# Patient Record
Sex: Female | Born: 1955 | Race: White | Hispanic: No | Marital: Married | Smoking: Current some day smoker
Health system: Southern US, Community
[De-identification: ages and names within clinical notes are randomized; demographics above are authoritative.]

## PROBLEM LIST (undated history)

## (undated) DIAGNOSIS — J309 Allergic rhinitis, unspecified: Secondary | ICD-10-CM

## (undated) DIAGNOSIS — K589 Irritable bowel syndrome without diarrhea: Secondary | ICD-10-CM

## (undated) DIAGNOSIS — R570 Cardiogenic shock: Secondary | ICD-10-CM

## (undated) DIAGNOSIS — D179 Benign lipomatous neoplasm, unspecified: Secondary | ICD-10-CM

## (undated) DIAGNOSIS — Z720 Tobacco use: Secondary | ICD-10-CM

## (undated) DIAGNOSIS — H32 Chorioretinal disorders in diseases classified elsewhere: Secondary | ICD-10-CM

## (undated) DIAGNOSIS — F419 Anxiety disorder, unspecified: Secondary | ICD-10-CM

## (undated) DIAGNOSIS — I4510 Unspecified right bundle-branch block: Secondary | ICD-10-CM

## (undated) DIAGNOSIS — F909 Attention-deficit hyperactivity disorder, unspecified type: Secondary | ICD-10-CM

## (undated) DIAGNOSIS — I38 Endocarditis, valve unspecified: Secondary | ICD-10-CM

## (undated) DIAGNOSIS — I2510 Atherosclerotic heart disease of native coronary artery without angina pectoris: Secondary | ICD-10-CM

## (undated) DIAGNOSIS — M797 Fibromyalgia: Secondary | ICD-10-CM

## (undated) DIAGNOSIS — K602 Anal fissure, unspecified: Secondary | ICD-10-CM

## (undated) DIAGNOSIS — I10 Essential (primary) hypertension: Secondary | ICD-10-CM

## (undated) DIAGNOSIS — N23 Unspecified renal colic: Secondary | ICD-10-CM

## (undated) DIAGNOSIS — M1990 Unspecified osteoarthritis, unspecified site: Secondary | ICD-10-CM

## (undated) DIAGNOSIS — G540 Brachial plexus disorders: Secondary | ICD-10-CM

## (undated) DIAGNOSIS — E876 Hypokalemia: Secondary | ICD-10-CM

## (undated) DIAGNOSIS — I4729 Other ventricular tachycardia: Secondary | ICD-10-CM

## (undated) DIAGNOSIS — K219 Gastro-esophageal reflux disease without esophagitis: Secondary | ICD-10-CM

## (undated) DIAGNOSIS — I472 Ventricular tachycardia: Secondary | ICD-10-CM

## (undated) DIAGNOSIS — I5032 Chronic diastolic (congestive) heart failure: Secondary | ICD-10-CM

## (undated) DIAGNOSIS — C4491 Basal cell carcinoma of skin, unspecified: Secondary | ICD-10-CM

## (undated) DIAGNOSIS — N952 Postmenopausal atrophic vaginitis: Secondary | ICD-10-CM

## (undated) DIAGNOSIS — B399 Histoplasmosis, unspecified: Secondary | ICD-10-CM

## (undated) DIAGNOSIS — D649 Anemia, unspecified: Secondary | ICD-10-CM

## (undated) DIAGNOSIS — D72829 Elevated white blood cell count, unspecified: Secondary | ICD-10-CM

## (undated) DIAGNOSIS — N179 Acute kidney failure, unspecified: Secondary | ICD-10-CM

## (undated) DIAGNOSIS — E785 Hyperlipidemia, unspecified: Secondary | ICD-10-CM

## (undated) DIAGNOSIS — N951 Menopausal and female climacteric states: Secondary | ICD-10-CM

## (undated) HISTORY — DX: Postmenopausal atrophic vaginitis: N95.2

## (undated) HISTORY — DX: Unspecified renal colic: N23

## (undated) HISTORY — DX: Attention-deficit hyperactivity disorder, unspecified type: F90.9

## (undated) HISTORY — DX: Histoplasmosis, unspecified: B39.9

## (undated) HISTORY — DX: Unspecified right bundle-branch block: I45.10

## (undated) HISTORY — PX: CORONARY STENT PLACEMENT: SHX1402

## (undated) HISTORY — PX: TONSILLECTOMY: SUR1361

## (undated) HISTORY — DX: Chronic diastolic (congestive) heart failure: I50.32

## (undated) HISTORY — DX: Chorioretinal disorders in diseases classified elsewhere: H32

## (undated) HISTORY — DX: Gastro-esophageal reflux disease without esophagitis: K21.9

## (undated) HISTORY — PX: EYE SURGERY: SHX253

## (undated) HISTORY — DX: Elevated white blood cell count, unspecified: D72.829

## (undated) HISTORY — DX: Acute kidney failure, unspecified: N17.9

## (undated) HISTORY — DX: Menopausal and female climacteric states: N95.1

---
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
## 1978-06-17 HISTORY — PX: TONSILLECTOMY: SHX5217

## 1980-06-17 HISTORY — PX: DILATION AND CURETTAGE OF UTERUS: SHX78

## 2006-02-21 ENCOUNTER — Ambulatory Visit: Payer: Self-pay | Admitting: Internal Medicine

## 2006-04-09 ENCOUNTER — Ambulatory Visit: Payer: Self-pay | Admitting: Internal Medicine

## 2006-05-26 ENCOUNTER — Ambulatory Visit: Payer: Self-pay | Admitting: Internal Medicine

## 2006-05-26 LAB — CONVERTED CEMR LAB
Albumin: 3.8 g/dL (ref 3.5–5.2)
Alkaline Phosphatase: 86 units/L (ref 39–117)
BUN: 14 mg/dL (ref 6–23)
Basophils Absolute: 0.1 10*3/uL (ref 0.0–0.1)
CO2: 28 meq/L (ref 19–32)
Chol/HDL Ratio, serum: 5.3
Creatinine, Ser: 1 mg/dL (ref 0.4–1.2)
Glomerular Filtration Rate, Af Am: 75 mL/min/{1.73_m2}
Glucose, Bld: 100 mg/dL — ABNORMAL HIGH (ref 70–99)
HDL: 38.4 mg/dL — ABNORMAL LOW (ref >39.0)
Hemoglobin: 15 g/dL (ref 12.0–15.0)
MCHC: 34.5 g/dL (ref 30.0–36.0)
Monocytes Relative: 7.1 % (ref 3.0–11.0)
Neutro Abs: 7 10*3/uL (ref 1.4–7.7)
Neutrophils Relative %: 66.8 % (ref 43.0–77.0)
Platelets: 309 10*3/uL (ref 150–400)
RDW: 12.2 % (ref 11.5–14.6)
TSH: 1.6 microintl units/mL (ref 0.35–5.50)
Total Bilirubin: 0.5 mg/dL (ref 0.3–1.2)
Total Protein: 6.8 g/dL (ref 6.0–8.3)
Triglyceride fasting, serum: 466 mg/dL (ref 0–149)
VLDL: 93 mg/dL — ABNORMAL HIGH (ref 0–40)

## 2006-06-05 ENCOUNTER — Ambulatory Visit: Payer: Self-pay | Admitting: Internal Medicine

## 2006-07-14 ENCOUNTER — Other Ambulatory Visit: Admission: RE | Admit: 2006-07-14 | Discharge: 2006-07-14 | Payer: Self-pay | Admitting: Internal Medicine

## 2006-07-14 ENCOUNTER — Encounter (INDEPENDENT_AMBULATORY_CARE_PROVIDER_SITE_OTHER): Payer: Self-pay | Admitting: *Deleted

## 2006-07-14 ENCOUNTER — Ambulatory Visit: Payer: Self-pay | Admitting: Internal Medicine

## 2006-07-23 ENCOUNTER — Ambulatory Visit: Payer: Self-pay | Admitting: Internal Medicine

## 2006-08-08 ENCOUNTER — Ambulatory Visit: Payer: Self-pay | Admitting: Internal Medicine

## 2006-09-19 ENCOUNTER — Ambulatory Visit: Payer: Self-pay | Admitting: Internal Medicine

## 2006-10-14 ENCOUNTER — Ambulatory Visit: Payer: Self-pay | Admitting: Internal Medicine

## 2006-10-27 ENCOUNTER — Ambulatory Visit: Payer: Self-pay | Admitting: Internal Medicine

## 2006-12-15 DIAGNOSIS — N951 Menopausal and female climacteric states: Secondary | ICD-10-CM

## 2006-12-15 DIAGNOSIS — K219 Gastro-esophageal reflux disease without esophagitis: Secondary | ICD-10-CM

## 2006-12-15 DIAGNOSIS — J309 Allergic rhinitis, unspecified: Secondary | ICD-10-CM | POA: Insufficient documentation

## 2006-12-15 DIAGNOSIS — I10 Essential (primary) hypertension: Secondary | ICD-10-CM

## 2006-12-15 DIAGNOSIS — IMO0001 Reserved for inherently not codable concepts without codable children: Secondary | ICD-10-CM | POA: Insufficient documentation

## 2006-12-15 DIAGNOSIS — E785 Hyperlipidemia, unspecified: Secondary | ICD-10-CM

## 2006-12-15 HISTORY — DX: Menopausal and female climacteric states: N95.1

## 2007-01-05 ENCOUNTER — Ambulatory Visit: Payer: Self-pay | Admitting: Internal Medicine

## 2007-01-05 DIAGNOSIS — N952 Postmenopausal atrophic vaginitis: Secondary | ICD-10-CM

## 2007-01-05 HISTORY — DX: Postmenopausal atrophic vaginitis: N95.2

## 2007-01-14 ENCOUNTER — Telehealth (INDEPENDENT_AMBULATORY_CARE_PROVIDER_SITE_OTHER): Payer: Self-pay

## 2007-01-21 ENCOUNTER — Ambulatory Visit: Payer: Self-pay | Admitting: Internal Medicine

## 2007-01-21 DIAGNOSIS — R300 Dysuria: Secondary | ICD-10-CM

## 2007-01-21 DIAGNOSIS — N3000 Acute cystitis without hematuria: Secondary | ICD-10-CM

## 2007-01-21 LAB — CONVERTED CEMR LAB
Nitrite: NEGATIVE
Protein, U semiquant: 30

## 2007-02-10 ENCOUNTER — Ambulatory Visit: Payer: Self-pay | Admitting: Family Medicine

## 2007-02-10 DIAGNOSIS — S93409A Sprain of unspecified ligament of unspecified ankle, initial encounter: Secondary | ICD-10-CM | POA: Insufficient documentation

## 2007-02-10 DIAGNOSIS — F909 Attention-deficit hyperactivity disorder, unspecified type: Secondary | ICD-10-CM

## 2007-03-02 ENCOUNTER — Ambulatory Visit: Payer: Self-pay | Admitting: Internal Medicine

## 2007-04-03 ENCOUNTER — Ambulatory Visit: Payer: Self-pay | Admitting: Internal Medicine

## 2007-04-28 ENCOUNTER — Ambulatory Visit: Payer: Self-pay | Admitting: Internal Medicine

## 2007-04-28 DIAGNOSIS — J329 Chronic sinusitis, unspecified: Secondary | ICD-10-CM | POA: Insufficient documentation

## 2007-05-06 ENCOUNTER — Telehealth: Payer: Self-pay | Admitting: Internal Medicine

## 2007-05-18 ENCOUNTER — Telehealth: Payer: Self-pay | Admitting: Internal Medicine

## 2007-05-20 ENCOUNTER — Telehealth: Payer: Self-pay | Admitting: Internal Medicine

## 2007-06-26 ENCOUNTER — Ambulatory Visit: Payer: Self-pay | Admitting: Internal Medicine

## 2007-09-07 ENCOUNTER — Telehealth: Payer: Self-pay | Admitting: Internal Medicine

## 2007-09-17 ENCOUNTER — Telehealth: Payer: Self-pay | Admitting: Internal Medicine

## 2007-09-24 ENCOUNTER — Telehealth: Payer: Self-pay | Admitting: Internal Medicine

## 2007-10-23 ENCOUNTER — Telehealth: Payer: Self-pay | Admitting: Internal Medicine

## 2007-10-28 ENCOUNTER — Ambulatory Visit: Payer: Self-pay | Admitting: Internal Medicine

## 2008-01-21 ENCOUNTER — Telehealth: Payer: Self-pay | Admitting: Internal Medicine

## 2008-02-04 ENCOUNTER — Ambulatory Visit: Payer: Self-pay | Admitting: Internal Medicine

## 2008-02-04 LAB — CONVERTED CEMR LAB
AST: 15 units/L (ref 0–37)
Albumin: 4.1 g/dL (ref 3.5–5.2)
Alkaline Phosphatase: 88 units/L (ref 39–117)
BUN: 15 mg/dL (ref 6–23)
Blood in Urine, dipstick: NEGATIVE
CO2: 27 meq/L (ref 19–32)
Chloride: 103 meq/L (ref 96–112)
Eosinophils Absolute: 0.4 10*3/uL (ref 0.0–0.7)
Eosinophils Relative: 2.4 % (ref 0.0–5.0)
GFR calc non Af Amer: 61 mL/min
HDL: 32 mg/dL — ABNORMAL LOW (ref >39.0)
Ketones, urine, test strip: NEGATIVE
Lymphocytes Relative: 17.4 % (ref 12.0–46.0)
MCV: 94.6 fL (ref 78.0–100.0)
Monocytes Relative: 5.4 % (ref 3.0–12.0)
Neutrophils Relative %: 73.8 % (ref 43.0–77.0)
Nitrite: NEGATIVE
Platelets: 274 10*3/uL (ref 150–400)
Potassium: 3.8 meq/L (ref 3.5–5.1)
Protein, U semiquant: NEGATIVE
Total CHOL/HDL Ratio: 5.2
Triglycerides: 354 mg/dL (ref 0–149)
Urobilinogen, UA: 0.2
VLDL: 71 mg/dL — ABNORMAL HIGH (ref 0–40)
WBC Urine, dipstick: NEGATIVE
WBC: 15.3 10*3/uL — ABNORMAL HIGH (ref 4.5–10.5)

## 2008-02-11 ENCOUNTER — Encounter: Payer: Self-pay | Admitting: Internal Medicine

## 2008-02-11 ENCOUNTER — Ambulatory Visit: Payer: Self-pay | Admitting: Internal Medicine

## 2008-02-11 ENCOUNTER — Other Ambulatory Visit: Admission: RE | Admit: 2008-02-11 | Discharge: 2008-02-11 | Payer: Self-pay | Admitting: Internal Medicine

## 2008-02-11 DIAGNOSIS — M1990 Unspecified osteoarthritis, unspecified site: Secondary | ICD-10-CM

## 2008-05-10 ENCOUNTER — Telehealth: Payer: Self-pay | Admitting: Internal Medicine

## 2008-05-24 ENCOUNTER — Telehealth: Payer: Self-pay | Admitting: Internal Medicine

## 2008-05-26 ENCOUNTER — Ambulatory Visit: Payer: Self-pay | Admitting: Internal Medicine

## 2008-07-05 ENCOUNTER — Ambulatory Visit: Payer: Self-pay | Admitting: Internal Medicine

## 2008-07-05 DIAGNOSIS — N23 Unspecified renal colic: Secondary | ICD-10-CM

## 2008-07-05 HISTORY — DX: Unspecified renal colic: N23

## 2008-07-05 LAB — CONVERTED CEMR LAB
Bilirubin Urine: NEGATIVE
Ketones, urine, test strip: NEGATIVE
Protein, U semiquant: 100
Urobilinogen, UA: NEGATIVE

## 2008-07-11 ENCOUNTER — Telehealth: Payer: Self-pay | Admitting: Internal Medicine

## 2008-09-19 ENCOUNTER — Telehealth (INDEPENDENT_AMBULATORY_CARE_PROVIDER_SITE_OTHER): Payer: Self-pay

## 2008-12-16 ENCOUNTER — Ambulatory Visit: Payer: Self-pay | Admitting: Internal Medicine

## 2009-01-03 ENCOUNTER — Encounter: Payer: Self-pay | Admitting: Cardiology

## 2009-03-07 ENCOUNTER — Telehealth: Payer: Self-pay | Admitting: Internal Medicine

## 2009-03-17 ENCOUNTER — Ambulatory Visit: Payer: Self-pay | Admitting: Internal Medicine

## 2009-06-05 ENCOUNTER — Ambulatory Visit: Payer: Self-pay | Admitting: Internal Medicine

## 2009-07-18 ENCOUNTER — Ambulatory Visit: Payer: Self-pay | Admitting: Internal Medicine

## 2009-07-18 DIAGNOSIS — Z85828 Personal history of other malignant neoplasm of skin: Secondary | ICD-10-CM

## 2009-09-04 ENCOUNTER — Telehealth: Payer: Self-pay | Admitting: Internal Medicine

## 2009-10-02 ENCOUNTER — Telehealth: Payer: Self-pay | Admitting: Internal Medicine

## 2009-10-23 ENCOUNTER — Ambulatory Visit: Payer: Self-pay | Admitting: Internal Medicine

## 2009-10-23 LAB — CONVERTED CEMR LAB
ALT: 18 units/L (ref 0–35)
BUN: 21 mg/dL (ref 6–23)
Basophils Absolute: 0.1 10*3/uL (ref 0.0–0.1)
Bilirubin Urine: NEGATIVE
Bilirubin, Direct: 0.1 mg/dL (ref 0.0–0.3)
Cholesterol: 256 mg/dL — ABNORMAL HIGH (ref 0–200)
Creatinine, Ser: 1.1 mg/dL (ref 0.4–1.2)
Direct LDL: 159 mg/dL
Eosinophils Relative: 2.8 % (ref 0.0–5.0)
GFR calc non Af Amer: 52.38 mL/min (ref >60)
Glucose, Urine, Semiquant: NEGATIVE
HDL: 47 mg/dL (ref >39.00)
MCV: 94.2 fL (ref 78.0–100.0)
Monocytes Absolute: 0.8 10*3/uL (ref 0.1–1.0)
Monocytes Relative: 5.9 % (ref 3.0–12.0)
Neutrophils Relative %: 74.5 % (ref 43.0–77.0)
Platelets: 311 10*3/uL (ref 150.0–400.0)
Total Bilirubin: 0.4 mg/dL (ref 0.3–1.2)
VLDL: 79 mg/dL — ABNORMAL HIGH (ref 0.0–40.0)
WBC: 13.5 10*3/uL — ABNORMAL HIGH (ref 4.5–10.5)
pH: 5.5

## 2009-10-31 ENCOUNTER — Other Ambulatory Visit: Admission: RE | Admit: 2009-10-31 | Discharge: 2009-10-31 | Payer: Self-pay | Admitting: Internal Medicine

## 2009-10-31 ENCOUNTER — Ambulatory Visit: Payer: Self-pay | Admitting: Internal Medicine

## 2009-11-10 ENCOUNTER — Ambulatory Visit: Payer: Self-pay | Admitting: Internal Medicine

## 2009-11-10 DIAGNOSIS — J069 Acute upper respiratory infection, unspecified: Secondary | ICD-10-CM | POA: Insufficient documentation

## 2009-11-15 ENCOUNTER — Telehealth: Payer: Self-pay | Admitting: Internal Medicine

## 2009-11-16 ENCOUNTER — Telehealth: Payer: Self-pay | Admitting: Internal Medicine

## 2009-12-29 ENCOUNTER — Encounter (INDEPENDENT_AMBULATORY_CARE_PROVIDER_SITE_OTHER): Payer: Self-pay | Admitting: *Deleted

## 2010-01-04 ENCOUNTER — Telehealth: Payer: Self-pay | Admitting: Internal Medicine

## 2010-01-08 ENCOUNTER — Telehealth: Payer: Self-pay | Admitting: Internal Medicine

## 2010-04-20 ENCOUNTER — Ambulatory Visit: Payer: Self-pay | Admitting: Internal Medicine

## 2010-04-20 LAB — CONVERTED CEMR LAB
AST: 18 units/L (ref 0–37)
Cholesterol: 242 mg/dL — ABNORMAL HIGH (ref 0–200)
TSH: 2.18 microintl units/mL (ref 0.35–5.50)

## 2010-05-08 ENCOUNTER — Encounter: Admission: RE | Admit: 2010-05-08 | Discharge: 2010-05-08 | Payer: Self-pay | Admitting: Internal Medicine

## 2010-07-10 ENCOUNTER — Telehealth: Payer: Self-pay

## 2010-07-12 ENCOUNTER — Ambulatory Visit
Admission: RE | Admit: 2010-07-12 | Discharge: 2010-07-12 | Payer: Self-pay | Source: Home / Self Care | Attending: Internal Medicine | Admitting: Internal Medicine

## 2010-07-17 NOTE — Progress Notes (Signed)
Summary: refill lortab deined - has written rx  Phone Note Call from Patient Call back at Home Phone [phone]   [name]: Patient--live call Summary of Call: [facility]. Patient stated that it is not at her pharmacy. Initial call taken by: Warnell Forester,  January 08, 2010 8:53 AM  Follow-up for Phone Call        Pt leaving for [place] today and needs to be sure that this refill be called in today.      Follow-up by: Lucy Antigua,  January 08, 2010 9:01 AM  Additional Follow-up for Phone Call Additional follow up Details #1::        should have RF until 01-31-10 Additional Follow-up by: Gordy Savers  MD,  January 08, 2010 12:54 PM    Additional Follow-up for Phone Call Additional follow up Details #2::    was given printed rx on 10/31/09 along with all other meds - cpx visit. Should have refill until 01/31/10 - denied.  Follow-up by: Duard Brady LPN,  January 08, 2010 1:31 PM   Appended Document: refill lortab deined - has written rx attempt to call - ans mach - LMTCB if questions - denied due to printed rx given at cpx 5/17    [facility]

## 2010-07-17 NOTE — Assessment & Plan Note (Signed)
Summary: suspicious mole/gotten larger and rough/cjr   Vital Signs:  Patient profile:   55 year old female Weight:      177 pounds Temp:     97.8 degrees F oral BP sitting:   150 / 100  (left arm) Cuff size:   regular  Vitals Entered By: Raechel Ache, RN (July 18, 2009 4:26 PM) CC: c/o raised mole on left upper chest   CC:  c/o raised mole on left upper chest.  History of Present Illness: 15 -year-old patient who is  seen today for follow-up.  Her main complaint is a change in a mole type lesion involving her left upper chest.  She has had considerable sun exposure over the years and has had 2 BCEs  removed from her facial area. She has been on Crestor for dyslipidemia.  She has treated hypertension.  Allergies: No Known Drug Allergies  Past History:  Past Medical History: Reviewed history from 02/11/2008 and no changes required. Hyperlipidemia Hypertension Allergic rhinitis-seasonal GERD hx of renal stones hx of Thoracic outlet syndrome histoplasmosis of the retina ADHD tobacco use fibromyalgia right shoulder pain Osteoarthritis post tobacco use  Physical Exam  General:  Well-developed,well-nourished,in no acute distress; alert,appropriate and cooperative throughout examination; blood pressure 150/90 left arm 140/92.  Right arm Skin:  5 to 6-mm raised nodule present, involving the left upper anterior chest; margins were well circumscribed.  There are scattered areas of increased pigmentation unclear whether this represented excoriations from local trauma or true spotty pigmentation.   Impression & Recommendations:  Problem # 1:  ADHD (ICD-314.01) new prescriptions were dispensed for  genetics  Problem # 2:  HYPERTENSION (ICD-401.9)  Her updated medication list for this problem includes:    Maxzide 75-50 Mg Tabs (Triamterene-hctz) .Marland Kitchen... Take 1 tablet by mouth once a day  Problem # 3:  BASAL CELL CARCINOMA, HX OF (ICD-V10.83)  her lesion involving her  left upper anterior chest may be an irritated seborrheic dermatosis versus BCE.  Will set up for dermatology referral  Orders: Dermatology Referral (Derma)  Complete Medication List: 1)  Bayer Aspirin 325 Mg Tabs (Aspirin) .... Take 1 tablet by mouth once a day 2)  Crestor 10 Mg Tabs (Rosuvastatin calcium) .... Take 1 tablet by mouth once a day 3)  Lortab 10 10-500 Mg Tabs (Hydrocodone-acetaminophen) .Marland Kitchen.. 1 tablet four times a day 4)  Maxzide 75-50 Mg Tabs (Triamterene-hctz) .... Take 1 tablet by mouth once a day 5)  Adderall 10 Mg Tabs (Amphetamine-dextroamphetamine) .... One in the pm prn 6)  Premarin 0.625 Mg/gm Crea (Estrogens, conjugated) 7)  Carisoprodol 350 Mg Tabs (Carisoprodol) .... One at bedtime as needed for pain 8)  Amphetamine-dextroamphetamine 20 Mg Tabs (Amphetamine-dextroamphetamine) .... One every am  Patient Instructions: 1)  Please schedule a follow-up appointment in 3 months. 2)  Limit your Sodium (Salt). 3)  Dermatology followup Prescriptions: AMPHETAMINE-DEXTROAMPHETAMINE 20 MG TABS (AMPHETAMINE-DEXTROAMPHETAMINE) one every am  #30 x 0   Entered and Authorized by:   Gordy Savers  MD   Signed by:   Gordy Savers  MD on 07/18/2009   Method used:   Print then Give to Patient   RxID:   [id number] ADDERALL 10 MG  TABS (AMPHETAMINE-DEXTROAMPHETAMINE) one in the pm prn  #30 x 0   Entered and Authorized by:   Gordy Savers  MD   Signed by:   Gordy Savers  MD on 07/18/2009   Method used:   Print then Give to  Patient   RxID:   [id number] AMPHETAMINE-DEXTROAMPHETAMINE 20 MG TABS (AMPHETAMINE-DEXTROAMPHETAMINE) one every am  #30 x 0   Entered and Authorized by:   Gordy Savers  MD   Signed by:   Gordy Savers  MD on 07/18/2009   Method used:   Print then Give to Patient   RxID:   [id number] ADDERALL 10 MG  TABS (AMPHETAMINE-DEXTROAMPHETAMINE) one in the pm prn  #30 x 0   Entered and Authorized by:   Gordy Savers  MD   Signed by:   Gordy Savers  MD on 07/18/2009   Method used:   Print then Give to Patient   RxID:   [id number] AMPHETAMINE-DEXTROAMPHETAMINE 20 MG TABS (AMPHETAMINE-DEXTROAMPHETAMINE) one every am  #30 x 0   Entered and Authorized by:   Gordy Savers  MD   Signed by:   Gordy Savers  MD on 07/18/2009   Method used:   Print then Give to Patient   RxID:   [id number] ADDERALL 10 MG  TABS (AMPHETAMINE-DEXTROAMPHETAMINE) one in the pm prn  #30 x 0   Entered and Authorized by:   Gordy Savers  MD   Signed by:   Gordy Savers  MD on 07/18/2009   Method used:   Print then Give to Patient   RxID:   [id number] LORTAB 10 10-500 MG TABS (HYDROCODONE-ACETAMINOPHEN) 1 tablet four times a day  #120 x 3   Entered and Authorized by:   Gordy Savers  MD   Signed by:   Gordy Savers  MD on 07/18/2009   Method used:   Print then Give to Patient   RxID:   [id number]

## 2010-07-17 NOTE — Progress Notes (Signed)
Summary: Pts req refill of generic Adderall 20mg  and 10mg . NO XR  Phone Note Call from Patient Call back at [facility] Phone [phone]   [name]: Patient Summary of Call: Pt req script for Generic Only Adderall 20mg  and adderall 10mg .     Insurance will not cover XR.   Write for generic Only.   3 month supply for 20mg  and 10mg  Initial call taken by: Lucy Antigua,  September 04, 2009 2:28 PM    Prescriptions: AMPHETAMINE-DEXTROAMPHETAMINE 20 MG TABS (AMPHETAMINE-DEXTROAMPHETAMINE) one every am  #30 x 0   Entered and Authorized by:   Gordy Savers  MD   Signed by:   Gordy Savers  MD on 09/04/2009   Method used:   Print then Give to Patient   RxID:   [id number] ADDERALL 10 MG  TABS (AMPHETAMINE-DEXTROAMPHETAMINE) one in the pm prn  #30 x 0   Entered and Authorized by:   Gordy Savers  MD   Signed by:   Gordy Savers  MD on 09/04/2009   Method used:   Print then Give to Patient   RxID:   [id number]   Appended Document: Pts req refill of generic Adderall 20mg  and 10mg . NO XR rx ready for pick up - pt aware kik

## 2010-07-17 NOTE — Assessment & Plan Note (Signed)
Summary: CPX/PAP/NJR/PT [facility] FROM BMP/CJR   Vital Signs:  Patient profile:   55 year old female Height:      62.75 inches Weight:      170 pounds BMI:     30.46 Temp:     98.0 degrees F oral BP sitting:   108 / 68  (right arm) Cuff size:   regular  Vitals Entered By: Duard Brady LPN (Oct 31, 2009 1:32 PM) CC: cpx -  Is Patient Diabetic? No   CC:  cpx - .  History of Present Illness: 55 year old patient who is seen today for a comprehensive examination.  Medical problems include fibromyalgia and chronic pain.  She has hypertension, dyslipidemia, and a family history of coronary artery disease.  She has ADHD.  Preventive Screening-Counseling & Management  Alcohol-Tobacco     Smoking Status: current     Smoking Cessation Counseling: yes  Allergies (verified): No Known Drug Allergies  Past History:  Past Medical History: Hyperlipidemia Hypertension Allergic rhinitis-seasonal GERD hx of renal stones hx of Thoracic outlet syndrome histoplasmosis of the retina ADHD tobacco use fibromyalgia right shoulder pain Osteoarthritis  tobacco use  Past Surgical History: Appendectomy- age 17 Tonsillectomy-age 67 D&C-1982  no colon screening Dental extractions  Family History: Reviewed history from 10/28/2007 and no changes required. Family History of Stroke F 1st degree relative <60 Fam hx COPD Fam Hx CAD Fam Hx ADHD  father died age 45, coronary artery disease mother died age 65, complications of COPD through a vascular disease paternal grandfather  died of an MI at age 62  one brother has ADHD  Social History: Reviewed history from 10/28/2007 and no changes required.  Current Smoker Married daughter  with Crohn's disease  Physical Exam  General:  Well-developed,well-nourished,in no acute distress; alert,appropriate and cooperative throughout examination; 120/84 Head:  Normocephalic and atraumatic without obvious abnormalities. No apparent alopecia  or balding. Eyes:  No corneal or conjunctival inflammation noted. EOMI. Perrla. Funduscopic exam benign, without hemorrhages, exudates or papilledema. Vision grossly normal. Ears:  External ear exam shows no significant lesions or deformities.  Otoscopic examination reveals clear canals, tympanic membranes are intact bilaterally without bulging, retraction, inflammation or discharge. Hearing is grossly normal bilaterally. Nose:  External nasal examination shows no deformity or inflammation. Nasal mucosa are pink and moist without lesions or exudates. Mouth:  Oral mucosa and oropharynx without lesions or exudates.  edentulous Neck:  No deformities, masses, or tenderness noted. Chest Wall:  No deformities, masses, or tenderness noted. Breasts:  No mass, nodules, thickening, tenderness, bulging, retraction, inflamation, nipple discharge or skin changes noted.   Lungs:  Normal respiratory effort, chest expands symmetrically. Lungs are clear to auscultation, no crackles or wheezes. Heart:  Normal rate and regular rhythm. S1 and S2 normal without gallop, murmur, click, rub or other extra sounds. Abdomen:  Bowel sounds positive,abdomen soft and non-tender without masses, organomegaly or hernias noted. Rectal:  No external abnormalities noted. Normal sphincter tone. No rectal masses or tenderness. Genitalia:  Normal introitus for age, no external lesions, no vaginal discharge, mucosa pink and moist, no vaginal or cervical lesions, no vaginal atrophy, no friaility or hemorrhage, normal uterus size and position, no adnexal masses or tenderness Msk:  No deformity or scoliosis noted of thoracic or lumbar spine.   Pulses:  faint Extremities:  No clubbing, cyanosis, edema, or deformity noted with normal full range of motion of all joints.   Neurologic:  No cranial nerve deficits noted. Station and gait are normal. Plantar reflexes  are down-going bilaterally. DTRs are symmetrical throughout. Sensory, motor and  coordinative functions appear intact. Skin:  Intact without suspicious lesions or rashes Cervical Nodes:  No lymphadenopathy noted Axillary Nodes:  No palpable lymphadenopathy Inguinal Nodes:  No significant adenopathy Psych:  Cognition and judgment appear intact. Alert and cooperative with normal attention span and concentration. No apparent delusions, illusions, hallucinations   Impression & Recommendations:  Problem # 1:  Preventive Health Care (ICD-V70.0)  Complete Medication List: 1)  Bayer Aspirin 325 Mg Tabs (Aspirin) .... Take 1 tablet by mouth once a day 2)  Lortab 10 10-500 Mg Tabs (Hydrocodone-acetaminophen) .Marland Kitchen.. 1 tablet four times a day 3)  Maxzide 75-50 Mg Tabs (Triamterene-hctz) .... Take 1/2  tablet by mouth once a day 4)  Premarin 0.625 Mg/gm Crea (Estrogens, conjugated) 5)  Carisoprodol 350 Mg Tabs (Carisoprodol) .... One at bedtime as needed for pain 6)  Amphetamine-dextroamphetamine 20 Mg Tabs (Amphetamine-dextroamphetamine) .... One every am 7)  Crestor 20 Mg Tabs (Rosuvastatin calcium) .... One twice weekly  Other Orders: Gastroenterology Referral (GI)  Patient Instructions: 1)  Please schedule a follow-up appointment in 4 months. 2)  Limit your Sodium (Salt). 3)  Tobacco is very bad for your health and your loved ones! You Should stop smoking!. 4)  It is important that you exercise regularly at least 20 minutes 5 times a week. If you develop chest pain, have severe difficulty breathing, or feel very tired , stop exercising immediately and seek medical attention. 5)  Take an Aspirin every day. Prescriptions: AMPHETAMINE-DEXTROAMPHETAMINE 20 MG TABS (AMPHETAMINE-DEXTROAMPHETAMINE) one every am  #30 x 0   Entered and Authorized by:   Gordy Savers  MD   Signed by:   Gordy Savers  MD on 10/31/2009   Method used:   Print then Give to Patient   RxID:   [id number] AMPHETAMINE-DEXTROAMPHETAMINE 20 MG TABS (AMPHETAMINE-DEXTROAMPHETAMINE) one every  am  #30 x 0   Entered and Authorized by:   Gordy Savers  MD   Signed by:   Gordy Savers  MD on 10/31/2009   Method used:   Print then Give to Patient   RxID:   [id number] CRESTOR 20 MG TABS (ROSUVASTATIN CALCIUM) one twice weekly  #90 x 6   Entered and Authorized by:   Gordy Savers  MD   Signed by:   Gordy Savers  MD on 10/31/2009   Method used:   Print then Give to Patient   RxID:   [id number] AMPHETAMINE-DEXTROAMPHETAMINE 20 MG TABS (AMPHETAMINE-DEXTROAMPHETAMINE) one every am  #30 x 0   Entered and Authorized by:   Gordy Savers  MD   Signed by:   Gordy Savers  MD on 10/31/2009   Method used:   Print then Give to Patient   RxID:   [id number] CARISOPRODOL 350 MG TABS (CARISOPRODOL) one at bedtime as needed for pain  #90 x 3   Entered and Authorized by:   Gordy Savers  MD   Signed by:   Gordy Savers  MD on 10/31/2009   Method used:   Print then Give to Patient   RxID:   [id number] MAXZIDE 75-50 MG TABS (TRIAMTERENE-HCTZ) Take 1/2  tablet by mouth once a day  #90 x 6   Entered and Authorized by:   Gordy Savers  MD   Signed by:   Gordy Savers  MD on 10/31/2009   Method used:   Print then Give to Patient  RxID:   [id number] LORTAB 10 10-500 MG TABS (HYDROCODONE-ACETAMINOPHEN) 1 tablet four times a day  #120 x 2   Entered and Authorized by:   Gordy Savers  MD   Signed by:   Gordy Savers  MD on 10/31/2009   Method used:   Print then Give to Patient   RxID:   [id number]

## 2010-07-17 NOTE — Progress Notes (Signed)
Summary: antibiotic?  Phone Note Call from Patient   [name]: Patient Call For: Catherine Savers  MD Summary of Call[facility] [address] Still having productive cough (green) and nasal drainage.  Wants antibiotic.  ? low grade fever. [phone] Initial call taken by: Lynann Beaver CMA,  November 15, 2009 10:53 AM  Follow-up for Phone Call        doxycycline 100 #20 one two times a day  Follow-up by: Catherine Savers  MD,  November 15, 2009 12:27 PM    New/Updated Medications: DOXYCYCLINE HYCLATE 100 MG TABS (DOXYCYCLINE HYCLATE) one by mouth bid Prescriptions: DOXYCYCLINE HYCLATE 100 MG TABS (DOXYCYCLINE HYCLATE) one by mouth bid  #14 x 0   Entered by:   Lynann Beaver CMA   Authorized by:   Catherine Savers  MD   Signed by:   Lynann Beaver CMA on 11/15/2009   Method used:   [facility]        [phone]* (retail)       [address] [place], [place]  [address]       Ph: [phone] or [phone]       Fax: [phone]   RxID:   [id number]  Pt. notified.

## 2010-07-17 NOTE — Letter (Signed)
Summary: [facility] Referral (unable to schedule) Notification  [facility]  [address] [place], [place] [address]   Phone: [phone]  Fax: [phone]      December 29, 2009 Catherine Craig [birth year] MRN: [id number]   [facility] [address] [place], [place]  [address]   Dear Dr.KWIATKOWSKI:   Thank you for your kind referral of the above patient. We have attempted to schedule the recommended COLONOSCOPY but have been unable to schedule because:  __ The patient was not available by phone and/or has not returned our calls.  _X_ The patient declined to schedule the procedure at this time.  We appreciate the referral and hope that we will have the opportunity to treat this patient in the future.    Sincerely,   [facility]  Vania Rea. Jarold Motto M.D. Hedwig Morton. Juanda Chance M.D. Venita Lick. Russella Dar M.D. Wilhemina Bonito. Marina Goodell M.D. Barbette Hair. Arlyce Dice M.D. Iva Boop M.D. Cheron Every.D.
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
## 2010-07-17 NOTE — Progress Notes (Signed)
Summary: Wants rx in husband name  Phone Note Call from Patient   [name]: Patient Summary of Call: Patient wants rx called in husbands name they both been using cough syrup & are out of it still coughing & knows it is to soon for a rx in her name call her at [phone]  Initial call taken by: Kathrynn Speed CMA,  November 16, 2009 12:38 PM  Follow-up for Phone Call        not appropriate Follow-up by: Gordy Savers  MD,  November 16, 2009 12:56 PM  Additional Follow-up for Phone Call Additional follow up Details #1::        I called pt and spoke with her and told her your response - she stated that when she was here - you told her that she AND her husband could use the cough syrup . Med will run out before they will allow refill if they are both using . Husband needs his own rx. I will forward to Dr. Kirtland Bouchard to advise.  Additional Follow-up by: Duard Brady LPN,  November 17, 8467 2:16 PM    Additional Follow-up for Phone Call Additional follow up Details #2::    6oz OK Follow-up by: Gordy Savers  MD,  November 16, 2009 4:02 PM  Additional Follow-up for Phone Call Additional follow up Details #3:: Details for Additional Follow-up Action Taken: see husband's chart for med fill KIK Additional Follow-up by: Duard Brady LPN,  November 16, 6293 4:45 PM   Appended Document: Wants rx in husband name attempt to call - ans mach - LMTCB , need husband's name , dob and where to call in . KIK

## 2010-07-17 NOTE — Progress Notes (Signed)
Summary:  refill of Lortab.   Phone Note Call from Patient Call back at [facility] Phone [phone]   [name]: Patient Summary of Call: Pt req refill of Lortab 10. Pt is req that this be done with combi Ibuprophen instead of Acetaminophen,  because pt is having all of her teeth removed.       Initial call taken by: Lucy Antigua,  October 02, 2009 11:23 AM  Follow-up for Phone Call        spoke with pt - she is out of meds because all her teeth have broken off and there was an abcess - she had to use more than rx'd . Was filled in Feb. with total fills 3 #120  , has used q4hrs and is out. Will speak with Dr. Amador Cunas to see if he will fill - even though I have explained that he says no RF until 6-1. He may want to see. VErbalized understanding Follow-up by: Duard Brady LPN,  October 02, 2009 2:09 PM  Additional Follow-up for Phone Call Additional follow up Details #1::        #120 with RF 3 must last until 11-15-09; to take more than this amount is to risk serious side affects including liver toxicity Additional Follow-up by: Gordy Savers  MD,  October 02, 2009 3:13 PM    Additional Follow-up for Phone Call Additional follow up Details #2::    attempt to call - ans mach - LMTCB to discuss. KIK Follow-up by: Duard Brady LPN,  October 02, 2009 4:44 PM  Additional Follow-up for Phone Call Additional follow up Details #3:: Details for Additional Follow-up Action Taken: spoke with pt - explained that this refill had to last until 11/15/09 - and reasons why - verbilized understanding .  [facility] [address] KIK Additional Follow-up by: Duard Brady LPN,  October 02, 2009 4:59 PM  Prescriptions: LORTAB 10 10-500 MG TABS (HYDROCODONE-ACETAMINOPHEN) 1 tablet four times a day  #120 x 2   Entered by:   Duard Brady LPN   Authorized by:   Gordy Savers  MD   Signed by:   Duard Brady LPN on 09/81/1914   Method used:   Historical   RxID:    [id number]  Of he is ano RF until 11-15-2009; may use Ibuprofen with present lortab

## 2010-07-17 NOTE — Progress Notes (Signed)
Summary: refill hyrdrocodone-acetamin  Phone Note Refill Request Message from:  Fax from Pharmacy on January 04, 2010 3:41 PM  Refills Requested: Medication #1:  LORTAB 10 10-500 MG TABS 1 tablet four times a day   Last Refilled: 12/07/2009 [facility]    [phone]    Method Requested: Telephone to Pharmacy Initial call taken by: Duard Brady LPN,  January 04, 2010 3:42 PM     Appended Document: refill hyrdrocodone-acetamin computer problen - see phone note 01/08/10 - denied , has printed rx from 5/17/[facility] visit. KIK

## 2010-07-17 NOTE — Assessment & Plan Note (Signed)
Summary: COUGH WHEEZING/PS   Vital Signs:  Patient profile:   55 year old female Weight:      179 pounds O2 Sat:      97 % on Room air Temp:     97.7 degrees F oral BP sitting:   112 / 70  (left arm) Cuff size:   regular  Vitals Entered By: Duard Brady LPN (Nov 10, 2009 2:51 PM)  O2 Flow:  Room air CC: c/o nasal and chest congestion , non productive cough, wheezing Is Patient Diabetic? No   CC:  c/o nasal and chest congestion , non productive cough, and wheezing.  History of Present Illness: 55 year old patient with 3 day history of head and chest congestion with productive cough and wheezing is been no fever.  She does have a history of allergic rhinitis treated hypertension and dyslipidemia.  Denies any chest pain or significant shortness of breath.  Cough is largely nonproductive, but she is expectorating considerable nasal secretions.  Her husband has similar symptoms  Preventive Screening-Counseling & Management  Alcohol-Tobacco     Smoking Status: current  Allergies (verified): No Known Drug Allergies  Past History:  Past Medical History: Reviewed history from 10/31/2009 and no changes required. 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  The patient denies fever, weight loss, weight gain, vision loss, decreased hearing, chest pain, syncope, dyspnea on exertion, peripheral edema, headaches, hemoptysis, abdominal pain, melena, hematochezia, severe indigestion/heartburn, hematuria, incontinence, genital sores, muscle weakness, suspicious skin lesions, transient blindness, difficulty walking, depression, unusual weight change, abnormal bleeding, enlarged lymph nodes, angioedema, and breast  masses.    Physical Exam  General:  overweight-appearing.  no distress.  Low normal blood pressure Head:  Normocephalic and atraumatic without obvious abnormalities. No apparent alopecia or balding. Eyes:  No corneal or conjunctival inflammation noted. EOMI. Perrla. Funduscopic exam benign, without hemorrhages, exudates or papilledema. Vision grossly normal. Ears:  External ear exam shows no significant lesions or deformities.  Otoscopic examination reveals clear canals, tympanic membranes are intact bilaterally without bulging, retraction, inflammation or discharge. Hearing is grossly normal bilaterally. Mouth:  Oral mucosa and oropharynx without lesions or exudates.  Teeth in good repair. Neck:  No deformities, masses, or tenderness noted. Lungs:  Normal respiratory effort, chest expands symmetrically. Lungs are clear to auscultation, no crackles or wheezes.  O2 saturation 97-98% Heart:  Normal rate and regular rhythm. S1 and S2 normal without gallop, murmur, click, rub or other extra sounds. Abdomen:  Bowel sounds positive,abdomen soft and non-tender without masses, organomegaly or hernias noted.   Impression & Recommendations:  Problem # 1:  URI (ICD-465.9)  Her updated medication list for this problem includes:    Bayer Aspirin 325 Mg Tabs (Aspirin) .Marland Kitchen... Take 1 tablet by mouth once a day    Hydrocodone-homatropine 5-1.5 Mg/19ml Syrp (Hydrocodone-homatropine) .Marland Kitchen... 1 teaspoon every 6 hours as needed for cough  Problem # 2:  HYPERTENSION (ICD-401.9)  Her updated medication list for this problem includes:    Maxzide 75-50 Mg Tabs (Triamterene-hctz) .Marland Kitchen... Take 1/2  tablet by mouth once a day  Complete Medication List: 1)  Bayer Aspirin 325 Mg Tabs (Aspirin) .... Take 1 tablet by mouth once a day 2)  Lortab 10 10-500 Mg Tabs (Hydrocodone-acetaminophen) .Marland Kitchen.. 1 tablet four  times a day 3)  Maxzide 75-50 Mg Tabs (Triamterene-hctz) .... Take 1/2  tablet by mouth once a day 4)  Premarin  0.625 Mg/gm Crea (Estrogens, conjugated) 5)  Carisoprodol 350 Mg Tabs (Carisoprodol) .... One at bedtime as needed for pain 6)  Amphetamine-dextroamphetamine 20 Mg Tabs (Amphetamine-dextroamphetamine) .... One every am 7)  Crestor 20 Mg Tabs (Rosuvastatin calcium) .... One twice weekly 8)  Amphetamine-dextroamphetamine 10 Mg Tabs (Amphetamine-dextroamphetamine) .... One daily 9)  Hydrocodone-homatropine 5-1.5 Mg/67ml Syrp (Hydrocodone-homatropine) .Marland Kitchen.. 1 teaspoon every 6 hours as needed for cough  Patient Instructions: 1)  Limit your Sodium (Salt) to less than 2 grams a day(slightly less than 1/2 a teaspoon) to prevent fluid retention, swelling, or worsening of symptoms. 2)  It is important that you exercise regularly at least 20 minutes 5 times a week. If you develop chest pain, have severe difficulty breathing, or feel very tired , stop exercising immediately and seek medical attention. 3)  You need to lose weight. Consider a lower calorie diet and regular exercise.  4)  Get plenty of rest, drink lots of clear liquids, and use Tylenol or Ibuprofen for fever and comfort. Return in 7-10 days if you're not better:sooner if you're feeling worse. Prescriptions: HYDROCODONE-HOMATROPINE 5-1.5 MG/5ML SYRP (HYDROCODONE-HOMATROPINE) 1 teaspoon every 6 hours as needed for cough  #6 oz x 2   Entered and Authorized by:   Gordy Savers  MD   Signed by:   Gordy Savers  MD on 11/10/2009   Method used:   Print then Give to Patient   RxID:   [id number] AMPHETAMINE-DEXTROAMPHETAMINE 10 MG TABS (AMPHETAMINE-DEXTROAMPHETAMINE) one daily  #30 x 0   Entered and Authorized by:   Gordy Savers  MD   Signed by:   Gordy Savers  MD on 11/10/2009   Method used:   Print then Give to Patient   RxID:   [id number]

## 2010-07-19 NOTE — Progress Notes (Signed)
Summary: Pt called req script to Hydrocodone [facility] - denied  Phone Note Refill Request Call back at Home Phone [phone] Message from:  Patient on July 10, 2010 4:26 PM  Refills Requested: Medication #1:  HYDROCODONE-ACETAMINOPHEN 10-300 MG TABS one every 6 hours as needed for pain.   Dosage confirmed as above?Dosage Confirmed Pt also wants to know her lastest lab results re: cholesterol. Pls call.    Method Requested: Telephone to Pharmacy Initial call taken by: Lucy Antigua,  July 10, 2010 4:26 PM  Follow-up for Phone Call        not due until 07-21-10  Follow-up by: Gordy Savers  MD,  July 10, 2010 5:06 PM  Additional Follow-up for Phone Call Additional follow up Details #1::        called pt - discussed labs  , also discussed no RF until 2/4 - pt states she discussed that she having dental work with dr. Daisy Blossom when she was last in office, and that she was having to use more.   I explained that I would send info to him , but i didnt know if he would approve. KIK Additional Follow-up by: Duard Brady LPN,  July 10, 2010 5:13 PM    Additional Follow-up for Phone Call Additional follow up Details #2::    max dose is 4/day- no rf until 07-21-10   Follow-up by: Gordy Savers  MD,  July 11, 2010 9:10 AM  Additional Follow-up for Phone Call Additional follow up Details #3:: Details for Additional Follow-up Action Taken: spoke with pt about no refill until 2/4 - still having to use more than rx'd - appt made to discuss with docotr. KIK Additional Follow-up by: Duard Brady LPN,  July 11, 2010 12:03 PM

## 2010-07-19 NOTE — Assessment & Plan Note (Signed)
Summary: discuss pain meds    kik   Vital Signs:  Patient profile:   55 year old female Weight:      172 pounds Temp:     98.2 degrees F oral BP sitting:   120 / 80  (right arm) Cuff size:   regular  Vitals Entered By: Duard Brady LPN (July 12, 2010 10:41 AM) CC: to discuss pain meds Is Patient Diabetic? No   CC:  to discuss pain meds.  History of Present Illness: 55 year old patient who is seen today for follow-up.  She has dyslipidemia, on Crestor 10 mg daily.  She has a history of arthritis, as well as fibromyalgia.  She was seen here today also to discuss a pain contract.  She has ADHD.  She has run out of her analgesics or related to some dental problems and excessive use.  The pain contract was discussed at length.  She has been somewhat noncompliant with her Crestor and wishes to check this next month with better compliance  Preventive Screening-Counseling & Management  Alcohol-Tobacco     Smoking Cessation Counseling: yes  Allergies (verified): No Known Drug Allergies  Past History:  Past Medical History: Reviewed history from 04/20/2010 and no changes required. 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    Physical Exam  General:  overweight-appearing.  120over  80overweight-appearing.     Impression & Recommendations:  Problem # 1:  OSTEOARTHRITIS (ICD-715.90)  Her updated medication list for this problem includes:    Bayer Aspirin 325 Mg Tabs (Aspirin) .Marland Kitchen... Take 1 tablet by mouth once a day    Hydrocodone-acetaminophen 10-300 Mg Tabs (Hydrocodone-acetaminophen) ..... One every 6 hours as needed for pain pain  contract discussed and signed.  Will refill at this time  Her updated medication list for this problem includes:    Bayer Aspirin 325 Mg Tabs (Aspirin) .Marland Kitchen... Take 1 tablet by mouth once a day    Hydrocodone-acetaminophen 10-300 Mg Tabs (Hydrocodone-acetaminophen) ..... One every 6 hours as needed for pain  Problem # 2:  ADHD (ICD-314.01) new prescriptions dispensed  Problem # 3:  HYPERLIPIDEMIA (ICD-272.4)  Her updated medication list for this problem includes:    Crestor 10 Mg Tabs (Rosuvastatin calcium) ..... One daily  Her updated medication list for this problem includes:    Crestor 10 Mg Tabs (Rosuvastatin calcium) ..... One daily  Complete Medication List: 1)  Bayer Aspirin 325 Mg Tabs (Aspirin) .... Take 1 tablet by mouth once a day 2)  Maxzide 75-50 Mg Tabs (Triamterene-hctz) .... Take 1/2  tablet by mouth once a day 3)  Carisoprodol 350 Mg Tabs (Carisoprodol) .... One at bedtime as needed for pain 4)  Amphetamine-dextroamphetamine 10 Mg Tabs (Amphetamine-dextroamphetamine) .... One twice daily 5)  Hydrocodone-acetaminophen 10-300 Mg Tabs (Hydrocodone-acetaminophen) .... One every 6 hours as needed for pain 6)  Crestor 10 Mg Tabs (Rosuvastatin calcium) .... One daily 7)  Chantix Starting Month Pak 0.5 Mg X 11 & 1 Mg X 42 Tabs (Varenicline tartrate) .... As directed 8)  Chantix 1 Mg Tabs (Varenicline tartrate) .... One twice daily  Patient Instructions: 1)  Please schedule a follow-up appointment in 3 months. 2)  check fasting cholesterol in one month 3)  It is important that you exercise regularly at least 20 minutes 5  times a week. If you develop chest pain, have severe difficulty breathing, or feel very tired , stop exercising immediately and seek medical attention. 4)  Tobacco is very bad for your health and your loved ones! You Should stop smoking!. Prescriptions: AMPHETAMINE-DEXTROAMPHETAMINE 10 MG TABS (AMPHETAMINE-DEXTROAMPHETAMINE) one twice daily  #60 x 0   Entered and Authorized by:   Gordy Savers  MD   Signed by:   Gordy Savers  MD on 07/12/2010   Method used:   Print then Give to Patient   RxID:   [id number] CHANTIX 1 MG TABS (VARENICLINE TARTRATE) one twice daily  #60 x 2   Entered and Authorized by:   Gordy Savers  MD   Signed by:   Gordy Savers  MD on 07/12/2010   Method used:   Print then Give to Patient   RxID:   [id number] CHANTIX STARTING MONTH PAK 0.5 MG X 11 & 1 MG X 42 TABS (VARENICLINE TARTRATE) as directed  #one x 0   Entered and Authorized by:   Gordy Savers  MD   Signed by:   Gordy Savers  MD on 07/12/2010   Method used:   Print then Give to Patient   RxID:   [id number] HYDROCODONE-ACETAMINOPHEN 10-300 MG TABS (HYDROCODONE-ACETAMINOPHEN) one every 6 hours as needed for pain  #120 x 2   Entered and Authorized by:   Gordy Savers  MD   Signed by:   Gordy Savers  MD on 07/12/2010   Method used:   Print then Give to Patient   RxID:   [id number] AMPHETAMINE-DEXTROAMPHETAMINE 10 MG TABS (AMPHETAMINE-DEXTROAMPHETAMINE) one daily  #30 x 0   Entered and Authorized by:   Gordy Savers  MD   Signed by:   Gordy Savers  MD on 07/12/2010   Method used:   Print then Give to Patient   RxID:   [id number]    Orders Added: 1)  Est. Patient Level III [51884]  Appended Document: discuss pain meds    kik    Clinical Lists Changes  Medications: Added new medication of HYDROCODONE-ACETAMINOPHEN 10-325 MG TABS (HYDROCODONE-ACETAMINOPHEN) 1 by mouth q 6hr as needed pain  **not to exceed 4 in  24hrs Changed medication from HYDROCODONE-ACETAMINOPHEN 10-300 MG TABS (HYDROCODONE-ACETAMINOPHEN) one every 6 hours as needed for pain to * ** SEE COMMENT **HYDROCODONE-ACETAMINOPHEN 10-300 MG TABS one every 6 hours as needed for pain      Appended Document: discuss pain meds    [facility] - [facility] to give 10/325mg  pain med , change to med list done   KIK   Clinical Lists Changes  Medications: Removed medication of * ** SEE COMMENT **HYDROCODONE-ACETAMINOPHEN 10-300 MG TABS one every 6 hours as needed for pain Rx of HYDROCODONE-ACETAMINOPHEN 10-325 MG TABS (HYDROCODONE-ACETAMINOPHEN) 1 by mouth q 6hr as needed pain  **not to exceed 4 in 24hrs;  #120 x 2;  Signed;  Entered by: Duard Brady LPN;  Authorized by: Gordy Savers  MD;  Method used: Historical    Prescriptions: HYDROCODONE-ACETAMINOPHEN 10-325 MG TABS (HYDROCODONE-ACETAMINOPHEN) 1 by mouth q 6hr as needed pain  **not to exceed 4 in 24hrs  #120 x  2   Entered by:   Duard Brady LPN   Authorized by:   Gordy Savers  MD   Signed by:   Duard Brady LPN on 95/62/1308   Method used:   Historical   RxID:   [id number]

## 2010-10-12 ENCOUNTER — Other Ambulatory Visit: Payer: Self-pay | Admitting: Internal Medicine

## 2010-10-12 NOTE — Telephone Encounter (Signed)
Schedule a CPX in 2 or 3 months. We'll perform lab at that time Okay to refill Lortab 10 #90. Refill x2 Okay to refill Crestor

## 2010-10-12 NOTE — Telephone Encounter (Signed)
Pt needs samples crestor 10mg  or 20 mg also needs med refill  lortab 10mg  [facility] [address] [phone]. Pt would like to know when should she have chole recheck ? Pt is completely out of med

## 2010-10-15 ENCOUNTER — Other Ambulatory Visit: Payer: Self-pay | Admitting: Internal Medicine

## 2010-10-15 MED ORDER — HYDROCODONE-ACETAMINOPHEN 10-325 MG PO TABS: 1.0000 | ORAL_TABLET | Freq: Four times a day (QID) | ORAL | Status: AC | PRN

## 2010-10-15 NOTE — Telephone Encounter (Signed)
Request refill pain med - last seen 07/12/10 lasr rx'd 07/12/10 #120 2RF Please advise

## 2010-10-15 NOTE — Telephone Encounter (Signed)
Pt called req refill for Hydrocodone 10-325 mg [facility] [address] Pt out of med since Friday. Also pt is req samples of  Crestor 10mg .

## 2010-10-15 NOTE — Telephone Encounter (Signed)
Catherine Craig - please schedule cpx 2-3 mos , lab will be done at that time , [facility] , only has 2 pkg crestor 10 - upfront for pick up. KIK

## 2010-10-15 NOTE — Telephone Encounter (Signed)
#  90  RF 2 

## 2010-10-15 NOTE — Telephone Encounter (Signed)
See note

## 2010-10-18 NOTE — Telephone Encounter (Addendum)
Pt is sch for cpx with fasting labs on 11-28-10

## 2010-11-02 NOTE — Assessment & Plan Note (Signed)
[facility]                              [facility] OFFICE NOTE   NAME:CROSSMidge, Momon                          MRN:          [id number]  DATE:02/21/2006                            DOB:          1955-09-04    A 55 year old female who is seen today to establish with our practice.  There is a long history of hypertension, hypercholesterolemia, and ongoing  tobacco use.  She has DJD and a history of fibromyalgia.  She is on chronic  Lortab.  She has seen an [place] rheumatogist in the past and apparently  also has a history of thoracic outlet syndrome.   She has seasonal allergic rhinitis.  History of renal stones and  gastroesophageal reflux disease.  Operations have included an appendectomy  at age 1, tonsillectomy at age 56, and a D&C in 61.  She is a one-half  pack per day smoker.   SOCIAL HISTORY:  Divorced.  Three children.  One daughter with Crohn's.   FAMILY HISTORY:  Father died at 68 of coronary artery disease.  Paternal  grandfather also died young of CAD.  Mother died at 65 of complications of  COPD and a stroke.  Multiple family members have ADHD.   PHYSICAL EXAMINATION:  GENERAL:  A moderately overweight white female in no  acute distress.  VITAL SIGNS:  Blood pressure 120/80.  SKIN:  Negative.  HEENT:  Fundi revealed scattered scarring in both retina.  ENT otherwise  negative.  NECK:  No bruits.  CHEST:  Clear.  CARDIOVASCULAR:  Normal heart sounds.  There was a grade 2/6 brief systolic  ejection murmur.  ABDOMEN:  Benign.  EXTREMITIES:  Intact peripheral pulses.  No edema.   IMPRESSION:  Hypercholesterolemia, hypertension, ongoing tobacco abuse,  seasonal allergic rhinitis, history of attention-deficit hyperactivity  disorder with nice response to Adderall.   DISPOSITION:  She is requesting an Adderall refill.  She did well without  this for a number of years when she did not have a demanding job.  Laboratory panel will be  reviewed.  We will reassess in three months.                                   Gordy Savers, MD   PFK/MedQ  DD:  02/21/2006  DT:  02/21/2006  Job #:  [id number]

## 2010-11-02 NOTE — Assessment & Plan Note (Signed)
[facility]                            [facility] OFFICE NOTE   NAME:Catherine Craig, Catherine Craig                          MRN:          [id number]  DATE:07/14/2006                            DOB:          01/28/1956    A 55 year old female seen today for a health maintenance Pap and  reassessment.  She has hypertension and fibromyalgia, on chronic  narcotics.  Also has a history of hypercholesterolemia.  She is  menopausal and has not had a period in over 1 year.  Greater than 20  years ago she had an abnormal Pap that normalized quickly.  She was  given a prescription for Lortab approximately one month ago, she states  that this was not turned into her pharmacy and was lost.  She had some  medications available from a prior prescription, but has not been taking  them regularly of late.   MEDICAL REGIMEN:  Includes Maxzide, Lortab, Crestor, daily aspirin, and  Adderall.   REVIEW OF SYSTEMS:  She was set up for colonoscopy late last year but  due to cost and deductibles, etc., this is going to be postponed until  later this year.  She is also due for a mammogram.   FAMILY HISTORY:  Reviewed, unchanged.   EXAMINATION:  Blood pressure 140/74, pulse rate about 90.  FUNDI, EAR, NOSE AND THROAT:  Unremarkable except funduscopic  examination revealed bilateral scarring related to her prior histo.  CHEST:  Clear.  BREAST:  Negative, did have some mild nodularity to the right, greater  than the left.  CARDIOVASCULAR:  Normal heart sounds, no murmurs.  ABDOMEN:  Benign.  PELVIC:  Revealed normal cervix, no adnexal masses.  Pap specimen  obtained.  Stool heme negative.   IMPRESSION:  1. Hypertension.  2. Hyperlipidemia.  3. Menopausal syndrome.  4. Fibromyalgia.   DISPOSITION:  She was given another prescription for Lortab with 2  refills, she was told that this will not be refilled for 3 months under  any circumstances.  Will reassess at that time.     Gordy Savers, MD  Electronically Signed    PFK/MedQ  DD: 07/14/2006  DT: 07/14/2006  Job #: [id number]

## 2010-11-20 ENCOUNTER — Encounter: Payer: Self-pay | Admitting: Internal Medicine

## 2010-11-28 ENCOUNTER — Encounter: Payer: Self-pay | Admitting: Internal Medicine

## 2010-11-28 ENCOUNTER — Ambulatory Visit (INDEPENDENT_AMBULATORY_CARE_PROVIDER_SITE_OTHER): Payer: BC Managed Care – PPO | Admitting: Internal Medicine

## 2010-11-28 VITALS — BP 120/72 | HR 82 | Temp 98.2°F | Resp 16 | Ht 63.5 in | Wt 178.0 lb

## 2010-11-28 DIAGNOSIS — I10 Essential (primary) hypertension: Secondary | ICD-10-CM

## 2010-11-28 DIAGNOSIS — R7302 Impaired glucose tolerance (oral): Secondary | ICD-10-CM

## 2010-11-28 DIAGNOSIS — J309 Allergic rhinitis, unspecified: Secondary | ICD-10-CM

## 2010-11-28 DIAGNOSIS — R7309 Other abnormal glucose: Secondary | ICD-10-CM

## 2010-11-28 DIAGNOSIS — Z0000 Encounter for general adult medical examination without abnormal findings: Secondary | ICD-10-CM

## 2010-11-28 DIAGNOSIS — E785 Hyperlipidemia, unspecified: Secondary | ICD-10-CM

## 2010-11-28 LAB — BASIC METABOLIC PANEL
BUN: 20 mg/dL (ref 6–23)
CO2: 26 mEq/L (ref 19–32)
Chloride: 102 mEq/L (ref 96–112)
Creatinine, Ser: 1.2 mg/dL (ref 0.4–1.2)
Glucose, Bld: 92 mg/dL (ref 70–99)
Potassium: 3.5 mEq/L (ref 3.5–5.1)

## 2010-11-28 LAB — TSH: TSH: 3.09 u[IU]/mL (ref 0.35–5.50)

## 2010-11-28 LAB — CBC WITH DIFFERENTIAL/PLATELET
Eosinophils Absolute: 0.4 10*3/uL (ref 0.0–0.7)
HCT: 42.7 % (ref 36.0–46.0)
Lymphs Abs: 3 10*3/uL (ref 0.7–4.0)
MCHC: 35.9 g/dL (ref 30.0–36.0)
MCV: 92.4 fl (ref 78.0–100.0)
Monocytes Absolute: 0.8 10*3/uL (ref 0.1–1.0)
Neutrophils Relative %: 62.2 % (ref 43.0–77.0)
Platelets: 272 10*3/uL (ref 150.0–400.0)
RDW: 13.2 % (ref 11.5–14.6)

## 2010-11-28 LAB — HEPATIC FUNCTION PANEL
ALT: 25 U/L (ref 0–35)
AST: 20 U/L (ref 0–37)
Albumin: 4.4 g/dL (ref 3.5–5.2)
Total Protein: 7 g/dL (ref 6.0–8.3)

## 2010-11-28 LAB — LIPID PANEL
Cholesterol: 190 mg/dL (ref 0–200)
VLDL: 67.8 mg/dL — ABNORMAL HIGH (ref 0.0–40.0)

## 2010-11-28 LAB — LDL CHOLESTEROL, DIRECT: Direct LDL: 107.7 mg/dL

## 2010-11-28 LAB — HEMOGLOBIN A1C: Hgb A1c MFr Bld: 5.5 % (ref 4.6–6.5)

## 2010-11-28 MED ORDER — ROSUVASTATIN CALCIUM 10 MG PO TABS: 10.0000 mg | ORAL_TABLET | Freq: Every day | ORAL | Status: DC

## 2010-11-28 MED ORDER — HYDROCODONE-ACETAMINOPHEN 10-325 MG PO TABS: 1.0000 | ORAL_TABLET | Freq: Four times a day (QID) | ORAL | Status: DC | PRN

## 2010-11-28 MED ORDER — FUROSEMIDE 20 MG PO TABS: 20.0000 mg | ORAL_TABLET | ORAL | Status: DC | PRN

## 2010-11-28 MED ORDER — TRIAMTERENE-HCTZ 75-50 MG PO TABS: 1.0000 | ORAL_TABLET | Freq: Every day | ORAL | Status: DC

## 2010-11-28 MED ORDER — AMPHETAMINE-DEXTROAMPHETAMINE 10 MG PO TABS: 10.0000 mg | ORAL_TABLET | Freq: Two times a day (BID) | ORAL | Status: DC

## 2010-11-28 MED ORDER — CARISOPRODOL 350 MG PO TABS: 350.0000 mg | ORAL_TABLET | Freq: Every evening | ORAL | Status: DC | PRN

## 2010-11-28 MED ORDER — FLUTICASONE PROPIONATE 50 MCG/ACT NA SUSP: 1.0000 | Freq: Every day | NASAL | Status: DC

## 2010-11-28 NOTE — Progress Notes (Signed)
Subjective:    Patient ID: Catherine Craig, female    DOB: June 06, 1956, 55 y.o.   MRN: [id number]  HPI 55 year old patient who is seen today for a wellness exam. She has had a recent gynecologic evaluation earlier this year. She was told that she had a rectocele and vaginal seal. She has treated dyslipidemia and has been on Crestor 10 mg daily. She has osteoarthritis and chronic low back pain. She has been intolerant of Chantix to cause nausea and vomiting. She has cut her tobacco consumption down to approximately 1/3-1/2 pack per day. She is on calcium and vitamin D supplements as well as fish oil supplements. A sister has diabetes;  she has requested a hemoglobin A1c    Preventive Screening-Counseling & Management  Alcohol-Tobacco     Smoking Cessation Counseling: yes  Allergies (verified):  No Known Drug Allergies  Past History:  Past Medical History: Reviewed history from 04/20/2010 and no changes required. Hyperlipidemia Hypertension Allergic rhinitis-seasonal GERD hx of renal stones hx of Thoracic outlet syndrome histoplasmosis of the retina ADHD tobacco use fibromyalgia right shoulder pain Osteoarthritis     Review of Systems  Constitutional: Negative for fever, appetite change, fatigue and unexpected weight change.  HENT: Positive for congestion. Negative for hearing loss, ear pain, nosebleeds, sore throat, mouth sores, trouble swallowing, neck stiffness, dental problem, voice change, sinus pressure and tinnitus.   Eyes: Negative for photophobia, pain, redness and visual disturbance.  Respiratory: Negative for cough, chest tightness and shortness of breath.   Cardiovascular: Negative for chest pain, palpitations and leg swelling.  Gastrointestinal: Negative for nausea, vomiting, abdominal pain, diarrhea, constipation, blood in stool, abdominal distention and rectal pain.  Genitourinary: Negative for dysuria, urgency, frequency, hematuria, flank pain, vaginal bleeding,  vaginal discharge, difficulty urinating, genital sores, vaginal pain, menstrual problem and pelvic pain.  Musculoskeletal: Positive for back pain and arthralgias.  Skin: Negative for rash.  Neurological: Negative for dizziness, syncope, speech difficulty, weakness, light-headedness, numbness and headaches.  Hematological: Negative for adenopathy. Does not bruise/bleed easily.  Psychiatric/Behavioral: Negative for suicidal ideas, behavioral problems, self-injury, dysphoric mood and agitation. The patient is not nervous/anxious.        Objective:   Physical Exam  Constitutional: She is oriented to person, place, and time. She appears well-developed and well-nourished.  HENT:  Head: Normocephalic and atraumatic.  Right Ear: External ear normal.  Left Ear: External ear normal.  Mouth/Throat: Oropharynx is clear and moist.  Eyes: Conjunctivae and EOM are normal.  Neck: Normal range of motion. Neck supple. No JVD present. No thyromegaly present.  Cardiovascular: Normal rate, regular rhythm, normal heart sounds and intact distal pulses.   No murmur heard. Pulmonary/Chest: Effort normal and breath sounds normal. She has no wheezes. She has no rales.  Abdominal: Soft. Bowel sounds are normal. She exhibits no distension and no mass. There is no tenderness. There is no rebound and no guarding.  Musculoskeletal: Normal range of motion. She exhibits no edema and no tenderness.  Neurological: She is alert and oriented to person, place, and time. She has normal reflexes. No cranial nerve deficit. She exhibits normal muscle tone. Coordination normal.  Skin: Skin is warm and dry. No rash noted.  Psychiatric: She has a normal mood and affect. Her behavior is normal.          Assessment & Plan:    Annual clinical exam Dyslipidemia Osteoarthritis and chronic low back pain Hypertension stable  A colonoscopy was encouraged. A mammogram will be obtained in the fall.  She will continue her gynecologic  followup. Medical regimen unchanged except Flonase will be added to her regimen we'll recheck in 6 months

## 2010-11-28 NOTE — Patient Instructions (Signed)
Limit your sodium (Salt) intake    It is important that you exercise regularly, at least 20 minutes 3 to 4 times per week.  If you develop chest pain or shortness of breath seek  medical attention.  You need to lose weight.  Consider a lower calorie diet and regular exercise.  Return in 6 months for follow-up   

## 2010-12-28 ENCOUNTER — Telehealth: Payer: Self-pay | Admitting: Internal Medicine

## 2010-12-28 NOTE — Telephone Encounter (Signed)
Please advise 

## 2010-12-28 NOTE — Telephone Encounter (Signed)
All normal ; HghA1C 5.5; Chol 190

## 2010-12-28 NOTE — Telephone Encounter (Signed)
Spoke with pt - will see if we have samples - went to pick up crestor - $500 for rx. Has no insurance . Is there an alternative we can use ?  Lipitor didn't work in the past.

## 2010-12-28 NOTE — Telephone Encounter (Signed)
Please switch to Pravachol 40 mg one daily placed on prescription for 90 with 4 refills

## 2010-12-28 NOTE — Telephone Encounter (Signed)
Requesting lab results from last month. 

## 2010-12-31 MED ORDER — PRAVASTATIN SODIUM 40 MG PO TABS: 40.0000 mg | ORAL_TABLET | Freq: Every evening | ORAL | Status: DC

## 2010-12-31 MED ORDER — BENZONATATE 200 MG PO CAPS: 200.0000 mg | ORAL_CAPSULE | Freq: Three times a day (TID) | ORAL | Status: AC | PRN

## 2010-12-31 NOTE — Telephone Encounter (Signed)
Generic Tessalon Perles 200 mg #30 one 3 times a day

## 2010-12-31 NOTE — Telephone Encounter (Signed)
Spoke with pt - informed of change - [facility].   Also requesting cough med to be called on OTC not helping - cough at night from drainage.  Please advise

## 2011-01-07 ENCOUNTER — Telehealth: Payer: Self-pay

## 2011-01-07 MED ORDER — TRIAMTERENE-HCTZ 75-50 MG PO TABS: ORAL_TABLET | ORAL | Status: DC

## 2011-01-07 NOTE — Telephone Encounter (Signed)
Correction to sig made and [facility] KIK

## 2011-02-07 ENCOUNTER — Telehealth: Payer: Self-pay | Admitting: Internal Medicine

## 2011-02-07 MED ORDER — ROSUVASTATIN CALCIUM 10 MG PO TABS: 10.0000 mg | ORAL_TABLET | Freq: Every day | ORAL | Status: DC

## 2011-02-07 NOTE — Telephone Encounter (Signed)
Pt needs written rx for Crestor 90 day supply with 3 refills. [employer] said pt will get free prescription for crestor with a year prescription.

## 2011-02-07 NOTE — Telephone Encounter (Signed)
Spoke with pt - rx will be avilb in AM for pick up.

## 2011-02-12 ENCOUNTER — Telehealth: Payer: Self-pay

## 2011-02-12 NOTE — Telephone Encounter (Signed)
Okay for Crestor samples

## 2011-02-12 NOTE — Telephone Encounter (Signed)
Pt left message c/o pravachol causing nausea, stomach cramps, and acid reflux. She states that she cannot take it and would like to know if she can get some Crestor samples until she gets thing worked out with her insurance. Please advise

## 2011-02-12 NOTE — Telephone Encounter (Signed)
Spoke with pt - informed samples ready for pick up

## 2011-03-08 ENCOUNTER — Other Ambulatory Visit: Payer: Self-pay | Admitting: Internal Medicine

## 2011-03-08 NOTE — Telephone Encounter (Signed)
Pt needs new rx generic adderall 10 mg °

## 2011-03-11 ENCOUNTER — Telehealth: Payer: Self-pay

## 2011-03-11 MED ORDER — AMPHETAMINE-DEXTROAMPHETAMINE 10 MG PO TABS: 10.0000 mg | ORAL_TABLET | Freq: Two times a day (BID) | ORAL | Status: DC

## 2011-03-11 NOTE — Telephone Encounter (Signed)
ok 

## 2011-03-11 NOTE — Telephone Encounter (Signed)
ATTEMPT TO CALL - vm - lmtcb IF QUESTIONS - RX'S READY FOR PIC UP

## 2011-04-04 ENCOUNTER — Other Ambulatory Visit: Payer: Self-pay | Admitting: Internal Medicine

## 2011-04-04 DIAGNOSIS — Z1231 Encounter for screening mammogram for malignant neoplasm of breast: Secondary | ICD-10-CM

## 2011-04-16 ENCOUNTER — Ambulatory Visit (INDEPENDENT_AMBULATORY_CARE_PROVIDER_SITE_OTHER): Payer: BC Managed Care – PPO

## 2011-04-16 ENCOUNTER — Telehealth: Payer: Self-pay | Admitting: Internal Medicine

## 2011-04-16 DIAGNOSIS — Z23 Encounter for immunization: Secondary | ICD-10-CM

## 2011-04-16 MED ORDER — FUROSEMIDE 20 MG PO TABS: 20.0000 mg | ORAL_TABLET | ORAL | Status: DC | PRN

## 2011-04-16 NOTE — Telephone Encounter (Signed)
Walk-in----  Refill Adderall, Lortab, Lasix. Patient knows that Dr Kirtland Bouchard is out of the office this week. She is out of her meds. Thanks.

## 2011-04-17 ENCOUNTER — Telehealth: Payer: Self-pay

## 2011-04-17 NOTE — Telephone Encounter (Signed)
Pt called again about refill she is worried because she is out of meds

## 2011-04-17 NOTE — Telephone Encounter (Signed)
Error opened

## 2011-04-17 NOTE — Telephone Encounter (Signed)
Lasix done this AM - called in norco # 120  - will have to call pt back - according to venee the pharmacist [facility] [address] - they fill adderall in June - July - and Aug - have not recv'd any rx's for adderall since then.  Located rxs's in pic up file up front - pt never picked up. Will call in AM once another dr. Laure Kidney. KIK Explained to pt - that if she calls for controlled substance and has not heard from our office in 5 days - to call and f/u on. Pt has gone for 6wks with out any adderall because she though it could not be filled at the time of request.

## 2011-04-18 ENCOUNTER — Other Ambulatory Visit: Payer: Self-pay

## 2011-04-18 MED ORDER — AMPHETAMINE-DEXTROAMPHETAMINE 10 MG PO TABS: 10.0000 mg | ORAL_TABLET | Freq: Two times a day (BID) | ORAL | Status: DC

## 2011-04-18 NOTE — Telephone Encounter (Signed)
Dr. Amador Cunas out of office till next week - dr. Caryl Never agreed to 1 rx for 30 days until dr. Amador Cunas returnes -and mach - rx ready for pic up

## 2011-05-16 ENCOUNTER — Ambulatory Visit
Admission: RE | Admit: 2011-05-16 | Discharge: 2011-05-16 | Disposition: A | Discharge status: Home / Self Care | Payer: BC Managed Care – PPO | Source: Ambulatory Visit | Attending: Internal Medicine | Admitting: Internal Medicine

## 2011-05-16 ENCOUNTER — Other Ambulatory Visit: Payer: Self-pay | Admitting: Internal Medicine

## 2011-05-16 DIAGNOSIS — Z1231 Encounter for screening mammogram for malignant neoplasm of breast: Secondary | ICD-10-CM

## 2011-05-16 NOTE — Telephone Encounter (Signed)
Pt last seen 11/28/10.  Pls advise.  

## 2011-05-16 NOTE — Telephone Encounter (Signed)
Pt called req refill of HYDROcodone-acetaminophen (NORCO) 10-325 MG per tablet [facility] [address] [facility] [address].

## 2011-05-16 NOTE — Telephone Encounter (Signed)
#  120  RF 2 

## 2011-05-17 MED ORDER — HYDROCODONE-ACETAMINOPHEN 10-325 MG PO TABS: 1.0000 | ORAL_TABLET | Freq: Four times a day (QID) | ORAL | Status: DC | PRN

## 2011-05-17 NOTE — Telephone Encounter (Signed)
Called in.

## 2011-05-27 ENCOUNTER — Other Ambulatory Visit: Payer: Self-pay | Admitting: Internal Medicine

## 2011-05-27 MED ORDER — AMPHETAMINE-DEXTROAMPHETAMINE 10 MG PO TABS: 10.0000 mg | ORAL_TABLET | Freq: Two times a day (BID) | ORAL | Status: DC

## 2011-05-27 NOTE — Telephone Encounter (Signed)
Pt requesting refill  amphetamine-dextroamphetamine (ADDERALL) 10 MG tablet   Pt requesting rx to be ready to pick up today. Father in law is in the process of passing and pt is out of town. Pt husband will be meeting her today and would like to pick up script before going out of town. Please contact

## 2011-05-27 NOTE — Telephone Encounter (Signed)
Attempt to call - VM - LMTCB if questions - rx ready for pick up 

## 2011-05-28 ENCOUNTER — Ambulatory Visit: Payer: BC Managed Care – PPO | Admitting: Internal Medicine

## 2011-06-04 ENCOUNTER — Ambulatory Visit: Payer: BC Managed Care – PPO | Admitting: Internal Medicine

## 2011-06-04 ENCOUNTER — Telehealth: Payer: Self-pay

## 2011-06-04 NOTE — Telephone Encounter (Signed)
error 

## 2011-09-05 ENCOUNTER — Ambulatory Visit (INDEPENDENT_AMBULATORY_CARE_PROVIDER_SITE_OTHER): Payer: BC Managed Care – PPO | Admitting: Internal Medicine

## 2011-09-05 ENCOUNTER — Encounter: Payer: Self-pay | Admitting: Internal Medicine

## 2011-09-05 ENCOUNTER — Other Ambulatory Visit: Payer: Self-pay

## 2011-09-05 VITALS — BP 122/80 | Wt 176.0 lb

## 2011-09-05 DIAGNOSIS — I10 Essential (primary) hypertension: Secondary | ICD-10-CM

## 2011-09-05 DIAGNOSIS — E785 Hyperlipidemia, unspecified: Secondary | ICD-10-CM

## 2011-09-05 DIAGNOSIS — F909 Attention-deficit hyperactivity disorder, unspecified type: Secondary | ICD-10-CM

## 2011-09-05 DIAGNOSIS — IMO0001 Reserved for inherently not codable concepts without codable children: Secondary | ICD-10-CM

## 2011-09-05 MED ORDER — FUROSEMIDE 20 MG PO TABS: 20.0000 mg | ORAL_TABLET | ORAL | Status: DC | PRN

## 2011-09-05 MED ORDER — BUDESONIDE-FORMOTEROL FUMARATE 160-4.5 MCG/ACT IN AERO: 2.0000 | INHALATION_SPRAY | Freq: Two times a day (BID) | RESPIRATORY_TRACT | Status: AC

## 2011-09-05 MED ORDER — HYDROCODONE-ACETAMINOPHEN 10-325 MG PO TABS: 1.0000 | ORAL_TABLET | Freq: Four times a day (QID) | ORAL | Status: DC | PRN

## 2011-09-05 MED ORDER — AMPHETAMINE-DEXTROAMPHETAMINE 10 MG PO TABS: 10.0000 mg | ORAL_TABLET | Freq: Two times a day (BID) | ORAL | Status: DC

## 2011-09-05 MED ORDER — TRIAMTERENE-HCTZ 75-50 MG PO TABS: ORAL_TABLET | ORAL | Status: DC

## 2011-09-05 MED ORDER — ROSUVASTATIN CALCIUM 10 MG PO TABS: 10.0000 mg | ORAL_TABLET | Freq: Every day | ORAL | Status: DC

## 2011-09-05 NOTE — Patient Instructions (Addendum)
Limit your sodium (Salt) intake    It is important that you exercise regularly, at least 20 minutes 3 to 4 times per week.  If you develop chest pain or shortness of breath seek  medical attention.  Return in 6 months for follow-up  Smoking tobacco is very bad for your health. You should stop smoking immediately. 

## 2011-09-05 NOTE — Progress Notes (Signed)
  Subjective:    Patient ID: Catherine Craig, female    DOB: Feb 07, 1956, 56 y.o.   MRN: [id number]  HPI  56 year old patient who is in today for followup. She was seen 6 months ago for her annual exam. She has a history of ongoing tobacco use dyslipidemia fibromyalgia and ADHD. She is seen today for medicine refill. Laboratory studies were reviewed. She continues tolerate Crestor 10 mg daily well she also has osteoarthritis and a history of allergic rhinitis. In general she has done reasonably well.    Review of Systems  Constitutional: Negative.   HENT: Negative for hearing loss, congestion, sore throat, rhinorrhea, dental problem, sinus pressure and tinnitus.   Eyes: Negative for pain, discharge and visual disturbance.  Respiratory: Positive for cough and shortness of breath.   Cardiovascular: Negative for chest pain, palpitations and leg swelling.  Gastrointestinal: Negative for nausea, vomiting, abdominal pain, diarrhea, constipation, blood in stool and abdominal distention.  Genitourinary: Negative for dysuria, urgency, frequency, hematuria, flank pain, vaginal bleeding, vaginal discharge, difficulty urinating, vaginal pain and pelvic pain.  Musculoskeletal: Positive for myalgias. Negative for joint swelling, arthralgias and gait problem.  Skin: Negative for rash.  Neurological: Negative for dizziness, syncope, speech difficulty, weakness, numbness and headaches.  Hematological: Negative for adenopathy.  Psychiatric/Behavioral: Negative for behavioral problems, dysphoric mood and agitation. The patient is not nervous/anxious.        Objective:   Physical Exam  Constitutional: She is oriented to person, place, and time. She appears well-developed and well-nourished.  HENT:  Head: Normocephalic.  Right Ear: External ear normal.  Left Ear: External ear normal.  Mouth/Throat: Oropharynx is clear and moist.  Eyes: Conjunctivae and EOM are normal. Pupils are equal, round, and reactive to  light.  Neck: Normal range of motion. Neck supple. No thyromegaly present.  Cardiovascular: Normal rate, regular rhythm, normal heart sounds and intact distal pulses.   Pulmonary/Chest: Effort normal and breath sounds normal.  Abdominal: Soft. Bowel sounds are normal. She exhibits no mass. There is no tenderness.  Musculoskeletal: Normal range of motion.  Lymphadenopathy:    She has no cervical adenopathy.  Neurological: She is alert and oriented to person, place, and time.  Skin: Skin is warm and dry. No rash noted.  Psychiatric: She has a normal mood and affect. Her behavior is normal.          Assessment & Plan:  ADHD. Lateral prescriptions refilled for 3 months Fibromyalgia. Analgesics refilled Dyslipidemia. Samples of Crestor provided Hypertension stable  Recheck 6 months for her annual exam All medications refilled Smoking cessation encouraged

## 2011-10-11 ENCOUNTER — Ambulatory Visit (INDEPENDENT_AMBULATORY_CARE_PROVIDER_SITE_OTHER): Payer: BC Managed Care – PPO | Admitting: Family

## 2011-10-11 ENCOUNTER — Encounter: Payer: Self-pay | Admitting: Family

## 2011-10-11 VITALS — BP 112/80 | Temp 98.6°F | Wt 182.0 lb

## 2011-10-11 DIAGNOSIS — M5430 Sciatica, unspecified side: Secondary | ICD-10-CM

## 2011-10-11 DIAGNOSIS — M545 Low back pain: Secondary | ICD-10-CM

## 2011-10-11 MED ORDER — METHYLPREDNISOLONE 4 MG PO KIT: PACK | ORAL | Status: AC

## 2011-10-11 MED ORDER — METHYLPREDNISOLONE ACETATE 40 MG/ML IJ SUSP
80.0000 mg | Freq: Once | INTRAMUSCULAR | Status: AC
  Administered 2011-10-11: 80 mg via INTRAMUSCULAR

## 2011-10-11 NOTE — Patient Instructions (Signed)

## 2011-10-11 NOTE — Progress Notes (Signed)
Subjective:    Patient ID: Catherine Craig, female    DOB: 09-23-1955, 56 y.o.   MRN: [id number]  Back Pain This is a new problem. The current episode started more than 1 month ago. The problem occurs constantly. The problem is unchanged. The pain is present in the lumbar spine. The quality of the pain is described as shooting. The pain radiates to the left thigh. The pain is at a severity of 10/10. The pain is severe. Worse during: worse with standing and walking. She has tried NSAIDs for the symptoms. The treatment provided mild relief.      Review of Systems  Constitutional: Negative.   Respiratory: Negative.   Cardiovascular: Negative.   Musculoskeletal: Positive for back pain.       Sciatica  Neurological: Negative.   Hematological: Negative.   Psychiatric/Behavioral: Negative.    Past Medical History  Diagnosis Date  . ADHD 02/10/2007  . ALLERGIC RHINITIS 12/15/2006  . BASAL CELL CARCINOMA, HX OF 07/18/2009  . FIBROMYALGIA 12/15/2006  . GERD 12/15/2006  . HYPERLIPIDEMIA 12/15/2006  . HYPERTENSION 12/15/2006  . MENOPAUSAL SYNDROME 12/15/2006  . OSTEOARTHRITIS 02/11/2008  . Renal colic 07/05/2008  . VAGINITIS, ATROPHIC 01/05/2007  . Histoplasmosis with retinitis     History   Social History  . Marital Status: Divorced    Spouse Name: N/A    Number of Children: N/A  . Years of Education: N/A   Occupational History  . Not on file.   Social History Main Topics  . Smoking status: Current Everyday Smoker    Types: Cigarettes  . Smokeless tobacco: Never Used  . Alcohol Use: Not on file  . Drug Use: Not on file  . Sexually Active: Not on file   Other Topics Concern  . Not on file   Social History Narrative  . No narrative on file    Past Surgical History  Procedure Date  . Appendectomy   . Tonsillectomy   . Dilation and curettage of uterus     Family History  Problem Relation Age of Onset  . COPD Neg Hx     family  . Heart disease Neg Hx     family  . ADD /  ADHD Neg Hx     family    Allergies  Allergen Reactions  . Chantix (Varenicline Tartrate)     Current Outpatient Prescriptions on File Prior to Visit  Medication Sig Dispense Refill  . amphetamine-dextroamphetamine (ADDERALL) 10 MG tablet Take 1 tablet (10 mg total) by mouth 2 (two) times daily.  60 tablet  0  . aspirin 325 MG tablet Take 325 mg by mouth daily.        . budesonide-formoterol (SYMBICORT) 160-4.5 MCG/ACT inhaler Inhale 2 puffs into the lungs 2 (two) times daily.  3 Inhaler  3  . furosemide (LASIX) 20 MG tablet Take 1 tablet (20 mg total) by mouth as needed.  90 tablet  3  . HYDROcodone-acetaminophen (NORCO) 10-325 MG per tablet Take 1 tablet by mouth every 6 (six) hours as needed. DO NOT EXCEED 4 IN 24HRS  120 tablet  3  . rosuvastatin (CRESTOR) 10 MG tablet Take 1 tablet (10 mg total) by mouth at bedtime.  90 tablet  3  . triamterene-hydrochlorothiazide (MAXZIDE) 75-50 MG per tablet 1/2 tablet daily  90 tablet  3  . DISCONTD: fluticasone (FLONASE) 50 MCG/ACT nasal spray Place 1 spray into the nose daily.  16 g  2  . DISCONTD: pravastatin (PRAVACHOL) 40 MG tablet  Take 1 tablet (40 mg total) by mouth every evening.  90 tablet  3   No current facility-administered medications on file prior to visit.    BP 112/80  Temp(Src) 98.6 F (37 C) (Oral)  Wt 182 lb (82.555 kg)chart    Objective:   Physical Exam  Constitutional: She is oriented to person, place, and time. She appears well-developed and well-nourished.  Neck: Normal range of motion. Neck supple.  Cardiovascular: Normal rate, regular rhythm and normal heart sounds.   Pulmonary/Chest: Effort normal and breath sounds normal.  Musculoskeletal: Normal range of motion.       Minimal tenderness to palpation of the lower lspine. Negative SLR.   Neurological: She is alert and oriented to person, place, and time.  Skin: Skin is warm and dry.  Psychiatric: She has a normal mood and affect.          Assessment &  Plan:  Assessment: Low back pain, Sciatica  Plan: Depo-Medrol 125mg  IM x 1. Medrol DP as directed if pain does not improve.  Rest. Call the office if symptoms worsen or persist. Recheckl as schedule and PRN.

## 2011-10-28 ENCOUNTER — Ambulatory Visit (INDEPENDENT_AMBULATORY_CARE_PROVIDER_SITE_OTHER)
Admission: RE | Admit: 2011-10-28 | Discharge: 2011-10-28 | Disposition: A | Discharge status: Home / Self Care | Payer: BC Managed Care – PPO | Source: Ambulatory Visit | Attending: Internal Medicine | Admitting: Internal Medicine

## 2011-10-28 ENCOUNTER — Ambulatory Visit (INDEPENDENT_AMBULATORY_CARE_PROVIDER_SITE_OTHER): Payer: BC Managed Care – PPO | Admitting: Internal Medicine

## 2011-10-28 ENCOUNTER — Encounter: Payer: Self-pay | Admitting: Internal Medicine

## 2011-10-28 VITALS — BP 110/80 | Temp 97.5°F | Wt 174.0 lb

## 2011-10-28 DIAGNOSIS — F17200 Nicotine dependence, unspecified, uncomplicated: Secondary | ICD-10-CM

## 2011-10-28 DIAGNOSIS — I10 Essential (primary) hypertension: Secondary | ICD-10-CM

## 2011-10-28 DIAGNOSIS — Z720 Tobacco use: Secondary | ICD-10-CM

## 2011-10-28 DIAGNOSIS — R079 Chest pain, unspecified: Secondary | ICD-10-CM

## 2011-10-28 DIAGNOSIS — K219 Gastro-esophageal reflux disease without esophagitis: Secondary | ICD-10-CM

## 2011-10-28 NOTE — Patient Instructions (Signed)
  Chest x-ray as discussed  Call or return to clinic prn if these symptoms worsen or fail to improve as anticipated.  Smoking tobacco is very bad for your health. You should stop smoking immediately.

## 2011-10-28 NOTE — Progress Notes (Signed)
  Subjective:    Patient ID: Catherine Craig, female    DOB: February 16, 1956, 56 y.o.   MRN: [id number]  HPI  56 year old patient who has treated hypertension. She has a history of fibromyalgia low back pain and has been evaluated by orthopedics. She is scheduled for a lumbar MRI in a few days. She was found to have a partial left foot drop and diminished Achilles reflexes and there is concern of a significant neuropathy. Her chief complaint today is fullness in the upper mid chest area. She recently noticed some fullness in this region and was concern about serious pathology. She also requested a chest x-ray. She does have a history of ongoing tobacco use and states she has not had a chest x-ray in 7 years     Review of Systems  Cardiovascular: Positive for chest pain.  Musculoskeletal: Positive for back pain and gait problem.       Objective:   Physical Exam  Constitutional: She appears well-developed and well-nourished. No distress.  Pulmonary/Chest:       There was an approximate 8 cm area of fullness in the upper mid chest area. This appeared to be either normal subcutaneous fat deposition or possible a lipoma. This did not involve the breast tissue          Assessment & Plan:  Chest wall abnormality. Probable normal fatty deposition versus lipoma. We'll check a chest x-ray at the patient's request. We will clinically observe Chronic low back pain with radiculopathy. Followup orthopedics and lumbar MRI as scheduled

## 2011-10-29 NOTE — Progress Notes (Signed)
Quick Note:  spke with pt- informed xray normal - no further questions/concerns ______

## 2011-11-12 ENCOUNTER — Other Ambulatory Visit: Payer: Self-pay | Admitting: Specialist

## 2011-11-12 DIAGNOSIS — M419 Scoliosis, unspecified: Secondary | ICD-10-CM

## 2011-11-12 DIAGNOSIS — M5104 Intervertebral disc disorders with myelopathy, thoracic region: Secondary | ICD-10-CM

## 2011-11-13 ENCOUNTER — Ambulatory Visit
Admission: RE | Admit: 2011-11-13 | Discharge: 2011-11-13 | Disposition: A | Discharge status: Home / Self Care | Payer: BC Managed Care – PPO | Source: Ambulatory Visit | Attending: Specialist | Admitting: Specialist

## 2011-11-13 VITALS — BP 156/90 | HR 83

## 2011-11-13 DIAGNOSIS — M419 Scoliosis, unspecified: Secondary | ICD-10-CM

## 2011-11-13 DIAGNOSIS — M5104 Intervertebral disc disorders with myelopathy, thoracic region: Secondary | ICD-10-CM

## 2011-11-13 MED ORDER — METHYLPREDNISOLONE ACETATE 40 MG/ML INJ SUSP (RADIOLOG
120.0000 mg | Freq: Once | INTRAMUSCULAR | Status: AC
  Administered 2011-11-13: 120 mg via EPIDURAL

## 2011-11-13 MED ORDER — IOHEXOL 180 MG/ML  SOLN
1.0000 mL | Freq: Once | INTRAMUSCULAR | Status: AC | PRN
  Administered 2011-11-13: 1 mL via EPIDURAL

## 2011-11-13 MED ORDER — DIAZEPAM 5 MG PO TABS
10.0000 mg | ORAL_TABLET | Freq: Once | ORAL | Status: AC
  Administered 2011-11-13: 10 mg via ORAL

## 2011-11-13 NOTE — Discharge Instructions (Signed)

## 2011-12-06 ENCOUNTER — Other Ambulatory Visit: Payer: Self-pay | Admitting: Specialist

## 2011-12-06 DIAGNOSIS — M549 Dorsalgia, unspecified: Secondary | ICD-10-CM

## 2011-12-11 ENCOUNTER — Ambulatory Visit
Admission: RE | Admit: 2011-12-11 | Discharge: 2011-12-11 | Disposition: A | Discharge status: Home / Self Care | Payer: BC Managed Care – PPO | Source: Ambulatory Visit | Attending: Specialist | Admitting: Specialist

## 2011-12-11 VITALS — BP 167/90 | HR 74

## 2011-12-11 DIAGNOSIS — M549 Dorsalgia, unspecified: Secondary | ICD-10-CM

## 2011-12-11 MED ORDER — METHYLPREDNISOLONE ACETATE 40 MG/ML INJ SUSP (RADIOLOG
120.0000 mg | Freq: Once | INTRAMUSCULAR | Status: AC
  Administered 2011-12-11: 120 mg via EPIDURAL

## 2011-12-11 MED ORDER — IOHEXOL 180 MG/ML  SOLN
1.0000 mL | Freq: Once | INTRAMUSCULAR | Status: AC | PRN
  Administered 2011-12-11: 1 mL via EPIDURAL

## 2011-12-11 MED ORDER — DIAZEPAM 5 MG PO TABS
10.0000 mg | ORAL_TABLET | Freq: Once | ORAL | Status: AC
  Administered 2011-12-11: 10 mg via ORAL

## 2011-12-12 ENCOUNTER — Other Ambulatory Visit: Payer: BC Managed Care – PPO

## 2011-12-30 ENCOUNTER — Other Ambulatory Visit: Payer: Self-pay | Admitting: Specialist

## 2011-12-30 DIAGNOSIS — M419 Scoliosis, unspecified: Secondary | ICD-10-CM

## 2012-01-07 ENCOUNTER — Ambulatory Visit (INDEPENDENT_AMBULATORY_CARE_PROVIDER_SITE_OTHER): Payer: BC Managed Care – PPO | Admitting: Internal Medicine

## 2012-01-07 ENCOUNTER — Encounter: Payer: Self-pay | Admitting: Internal Medicine

## 2012-01-07 VITALS — BP 120/90 | Temp 98.2°F | Wt 173.0 lb

## 2012-01-07 DIAGNOSIS — M1990 Unspecified osteoarthritis, unspecified site: Secondary | ICD-10-CM

## 2012-01-07 DIAGNOSIS — I10 Essential (primary) hypertension: Secondary | ICD-10-CM

## 2012-01-07 DIAGNOSIS — E785 Hyperlipidemia, unspecified: Secondary | ICD-10-CM

## 2012-01-07 DIAGNOSIS — F909 Attention-deficit hyperactivity disorder, unspecified type: Secondary | ICD-10-CM

## 2012-01-07 MED ORDER — AMPHETAMINE-DEXTROAMPHETAMINE 10 MG PO TABS: 10.0000 mg | ORAL_TABLET | Freq: Two times a day (BID) | ORAL | Status: DC

## 2012-01-07 MED ORDER — HYDROCODONE-ACETAMINOPHEN 10-325 MG PO TABS: 1.0000 | ORAL_TABLET | Freq: Four times a day (QID) | ORAL | Status: DC | PRN

## 2012-01-07 NOTE — Progress Notes (Signed)
Subjective:    Patient ID: Catherine Craig, female    DOB: 1955-06-30, 56 y.o.   MRN: [id number]  HPI   56 year old patient who is seen today for followup. Medical problems include a history of hypertension COPD and ongoing tobacco use she is on Crestor for dyslipidemia. She is followed by orthopedics and has been self referred to neurosurgery. She is considering back surgery for scoliosis and apparently a bulging disc. She has ADHD and needs medication refills. In general doing fairly well. She is treated hypertension which has been stable. She has recently made attempts to lose weight   Past Medical History  Diagnosis Date  . ADHD 02/10/2007  . ALLERGIC RHINITIS 12/15/2006  . BASAL CELL CARCINOMA, HX OF 07/18/2009  . FIBROMYALGIA 12/15/2006  . GERD 12/15/2006  . HYPERLIPIDEMIA 12/15/2006  . HYPERTENSION 12/15/2006  . MENOPAUSAL SYNDROME 12/15/2006  . OSTEOARTHRITIS 02/11/2008  . Renal colic 07/05/2008  . VAGINITIS, ATROPHIC 01/05/2007  . Histoplasmosis with retinitis     History   Social History  . Marital Status: Divorced    Spouse Name: N/A    Number of Children: N/A  . Years of Education: N/A   Occupational History  . Not on file.   Social History Main Topics  . Smoking status: Current Everyday Smoker    Types: Cigarettes  . Smokeless tobacco: Never Used  . Alcohol Use: Not on file  . Drug Use: Not on file  . Sexually Active: Not on file   Other Topics Concern  . Not on file   Social History Narrative  . No narrative on file    Past Surgical History  Procedure Date  . Appendectomy   . Tonsillectomy   . Dilation and curettage of uterus     Family History  Problem Relation Age of Onset  . COPD Neg Hx     family  . Heart disease Neg Hx     family  . ADD / ADHD Neg Hx     family    Allergies  Allergen Reactions  . Chantix (Varenicline Tartrate) Nausea And Vomiting    Current Outpatient Prescriptions on File Prior to Visit  Medication Sig Dispense Refill    . aspirin 325 MG tablet Take 325 mg by mouth daily.        . budesonide-formoterol (SYMBICORT) 160-4.5 MCG/ACT inhaler Inhale 2 puffs into the lungs 2 (two) times daily.  3 Inhaler  3  . furosemide (LASIX) 20 MG tablet Take 1 tablet (20 mg total) by mouth as needed.  90 tablet  3  . rosuvastatin (CRESTOR) 10 MG tablet Take 1 tablet (10 mg total) by mouth at bedtime.  90 tablet  3  . triamterene-hydrochlorothiazide (MAXZIDE) 75-50 MG per tablet 1/2 tablet daily  90 tablet  3  . DISCONTD: amphetamine-dextroamphetamine (ADDERALL) 10 MG tablet Take 1 tablet (10 mg total) by mouth 2 (two) times daily.  60 tablet  0  . DISCONTD: fluticasone (FLONASE) 50 MCG/ACT nasal spray Place 1 spray into the nose daily.  16 g  2  . DISCONTD: pravastatin (PRAVACHOL) 40 MG tablet Take 1 tablet (40 mg total) by mouth every evening.  90 tablet  3    BP 120/90  Temp 98.2 F (36.8 C) (Oral)  Wt 173 lb (78.472 kg)    Review of Systems  Constitutional: Negative.   HENT: Negative for hearing loss, congestion, sore throat, rhinorrhea, dental problem, sinus pressure and tinnitus.   Eyes: Negative for pain, discharge and visual disturbance.  Respiratory: Negative for cough and shortness of breath.   Cardiovascular: Negative for chest pain, palpitations and leg swelling.  Gastrointestinal: Negative for nausea, vomiting, abdominal pain, diarrhea, constipation, blood in stool and abdominal distention.  Genitourinary: Negative for dysuria, urgency, frequency, hematuria, flank pain, vaginal bleeding, vaginal discharge, difficulty urinating, vaginal pain and pelvic pain.  Musculoskeletal: Positive for back pain and arthralgias. Negative for joint swelling and gait problem.  Skin: Negative for rash.  Neurological: Negative for dizziness, syncope, speech difficulty, weakness, numbness and headaches.  Hematological: Negative for adenopathy.  Psychiatric/Behavioral: Negative for behavioral problems, dysphoric mood and  agitation. The patient is not nervous/anxious.        Objective:   Physical Exam  Constitutional: She is oriented to person, place, and time. She appears well-developed and well-nourished.  HENT:  Head: Normocephalic.  Right Ear: External ear normal.  Left Ear: External ear normal.  Mouth/Throat: Oropharynx is clear and moist.  Eyes: Conjunctivae and EOM are normal. Pupils are equal, round, and reactive to light.  Neck: Normal range of motion. Neck supple. No thyromegaly present.  Cardiovascular: Normal rate, regular rhythm, normal heart sounds and intact distal pulses.   Pulmonary/Chest: Effort normal and breath sounds normal.  Abdominal: Soft. Bowel sounds are normal. She exhibits no mass. There is no tenderness.  Musculoskeletal: Normal range of motion.  Lymphadenopathy:    She has no cervical adenopathy.  Neurological: She is alert and oriented to person, place, and time.  Skin: Skin is warm and dry. No rash noted.  Psychiatric: She has a normal mood and affect. Her behavior is normal.          Assessment & Plan:   Chronic low back pain. Followup orthopedics and neurosurgery Hypertension stable Dyslipidemia. Continue Crestor 10  Recheck 3-4 months

## 2012-01-07 NOTE — Patient Instructions (Signed)
Return in 3 months for follow-up  You need to lose weight.  Consider a lower calorie diet and regular exercise.  Limit your sodium (Salt) intake

## 2012-01-09 ENCOUNTER — Ambulatory Visit
Admission: RE | Admit: 2012-01-09 | Discharge: 2012-01-09 | Disposition: A | Discharge status: Home / Self Care | Payer: BC Managed Care – PPO | Source: Ambulatory Visit | Attending: Specialist | Admitting: Specialist

## 2012-01-09 VITALS — BP 125/97 | HR 87

## 2012-01-09 DIAGNOSIS — I10 Essential (primary) hypertension: Secondary | ICD-10-CM

## 2012-01-09 DIAGNOSIS — IMO0001 Reserved for inherently not codable concepts without codable children: Secondary | ICD-10-CM

## 2012-01-09 DIAGNOSIS — N951 Menopausal and female climacteric states: Secondary | ICD-10-CM

## 2012-01-09 DIAGNOSIS — M1990 Unspecified osteoarthritis, unspecified site: Secondary | ICD-10-CM

## 2012-01-09 DIAGNOSIS — K219 Gastro-esophageal reflux disease without esophagitis: Secondary | ICD-10-CM

## 2012-01-09 DIAGNOSIS — M419 Scoliosis, unspecified: Secondary | ICD-10-CM

## 2012-01-09 DIAGNOSIS — J309 Allergic rhinitis, unspecified: Secondary | ICD-10-CM

## 2012-01-09 DIAGNOSIS — N23 Unspecified renal colic: Secondary | ICD-10-CM

## 2012-01-09 DIAGNOSIS — N952 Postmenopausal atrophic vaginitis: Secondary | ICD-10-CM

## 2012-01-09 DIAGNOSIS — F909 Attention-deficit hyperactivity disorder, unspecified type: Secondary | ICD-10-CM

## 2012-01-09 DIAGNOSIS — E785 Hyperlipidemia, unspecified: Secondary | ICD-10-CM

## 2012-01-09 DIAGNOSIS — Z85828 Personal history of other malignant neoplasm of skin: Secondary | ICD-10-CM

## 2012-01-09 DIAGNOSIS — M549 Dorsalgia, unspecified: Secondary | ICD-10-CM

## 2012-01-09 DIAGNOSIS — Z720 Tobacco use: Secondary | ICD-10-CM

## 2012-01-09 MED ORDER — DIAZEPAM 5 MG PO TABS
10.0000 mg | ORAL_TABLET | Freq: Once | ORAL | Status: AC
  Administered 2012-01-09: 10 mg via ORAL

## 2012-01-09 MED ORDER — IOHEXOL 180 MG/ML  SOLN
1.0000 mL | Freq: Once | INTRAMUSCULAR | Status: AC | PRN
  Administered 2012-01-09: 1 mL via EPIDURAL

## 2012-01-09 MED ORDER — METHYLPREDNISOLONE ACETATE 40 MG/ML INJ SUSP (RADIOLOG
120.0000 mg | Freq: Once | INTRAMUSCULAR | Status: AC
  Administered 2012-01-09: 120 mg via EPIDURAL

## 2012-01-29 ENCOUNTER — Telehealth: Payer: Self-pay | Admitting: Internal Medicine

## 2012-01-29 NOTE — Telephone Encounter (Signed)
Please make an appt tomorrow with another provider

## 2012-01-29 NOTE — Telephone Encounter (Signed)
Please advise as to appointment time frame. Thank you.

## 2012-01-29 NOTE — Telephone Encounter (Signed)
Emergent: [name]: Ayra/Patient; Patient Name: Catherine Craig; PCP: Eleonore Chiquito; Best Callback Phone Number: [phone].  Called to request Medrol dospak for severe L sciatiatic from buttocks to ankle. Onset:  01/26/12.  Afebrile/tactile.  Last office visit 01/07/12. Unable to bear weight. Had epidural injection 01/09/12; awaiting appointment with neurosurgeon. Advised to see MD now for recent onset of one-sided pain, tenderness or aching that may worsen with standing or walking per Leg Non-Injury Guideline. Dr Amador Cunas is out of the office.  No appointments remain with any MD for 01/29/12.  Information noted and sent to [facility] pool for immediate call back.

## 2012-01-30 ENCOUNTER — Ambulatory Visit (INDEPENDENT_AMBULATORY_CARE_PROVIDER_SITE_OTHER): Payer: BC Managed Care – PPO | Admitting: Family Medicine

## 2012-01-30 ENCOUNTER — Encounter: Payer: Self-pay | Admitting: Family Medicine

## 2012-01-30 VITALS — BP 142/88 | Temp 98.3°F | Wt 163.0 lb

## 2012-01-30 DIAGNOSIS — M5416 Radiculopathy, lumbar region: Secondary | ICD-10-CM

## 2012-01-30 DIAGNOSIS — IMO0002 Reserved for concepts with insufficient information to code with codable children: Secondary | ICD-10-CM

## 2012-01-30 MED ORDER — PREDNISONE 10 MG PO TABS: ORAL_TABLET | ORAL | Status: DC

## 2012-01-30 NOTE — Progress Notes (Signed)
  Subjective:    Patient ID: Catherine Craig, female    DOB: 1955/10/03, 56 y.o.   MRN: [id number]  HPI  Acute visit. Patient seen with left lumbar back pain. Left radiculopathy symptoms. She's had several years of back pain but worsening recently. She's been followed by orthopedist and had epidural as recently as 01/09/2012. Initially this was providing relief but most recent epidural did not. She's used hydrocodone which has not helped much with pain. She has gotten temporary relief with oral prednisone in the past and is requesting the same. She has pending followup with neurosurgeon soon for second opinion. She does have occasional weakness and numbness left lower extremity. No incontinence symptoms. Pain both at rest and with activity. No fever or chills. No appetite changes.  Past Medical History  Diagnosis Date  . ADHD 02/10/2007  . ALLERGIC RHINITIS 12/15/2006  . BASAL CELL CARCINOMA, HX OF 07/18/2009  . FIBROMYALGIA 12/15/2006  . GERD 12/15/2006  . HYPERLIPIDEMIA 12/15/2006  . HYPERTENSION 12/15/2006  . MENOPAUSAL SYNDROME 12/15/2006  . OSTEOARTHRITIS 02/11/2008  . Renal colic 07/05/2008  . VAGINITIS, ATROPHIC 01/05/2007  . Histoplasmosis with retinitis    Past Surgical History  Procedure Date  . Appendectomy   . Tonsillectomy   . Dilation and curettage of uterus     reports that she has been smoking Cigarettes.  She has never used smokeless tobacco. Her alcohol and drug histories not on file. family history is negative for COPD, and Heart disease, and ADD / ADHD, . Allergies  Allergen Reactions  . Chantix (Varenicline Tartrate) Nausea And Vomiting      Review of Systems  Constitutional: Negative for fever, chills, appetite change and unexpected weight change.  Genitourinary: Negative for dysuria.  Neurological: Positive for weakness and numbness.       Objective:   Physical Exam  Constitutional: She appears well-developed and well-nourished.  Cardiovascular: Normal rate and  regular rhythm.   Pulmonary/Chest: Effort normal and breath sounds normal. No respiratory distress. She has no wheezes. She has no rales.  Musculoskeletal:       Left lower extremity no edema. Good distal foot pulses. Negative straight leg raise.  Neurological:       Full-strength lower extremities. She has slightly diminished left ankle compared to right otherwise symmetric lower extremity reflexes. She has slight weakness with left dorsi flexion compared to right. Normal sensory function to touch.          Assessment & Plan:  Left lumbar radiculopathy with progressive pain and intermittent numbness and weakness which has been present for years. Pending neurosurgical evaluation. Recent MRI per ortho currently not available.  Prednisone taper given. She'll continue with hydrocodone for severe pain

## 2012-02-04 ENCOUNTER — Telehealth: Payer: Self-pay | Admitting: Internal Medicine

## 2012-02-04 NOTE — Telephone Encounter (Signed)
Pt states that she is having a hard time getting an appt at [facility] and was instructed by Dr. Kirtland Bouchard to call back to the office if she has a problem. Please contact

## 2012-02-04 NOTE — Telephone Encounter (Signed)
spke with pt- states she has been calling [facility] for 1 month with no response - I will call and check on status I called [place] - spoke with shelia - Dr. Jule Ser got records on 01/29/12 and still needs to review  I called pt back and informed - she has appt with [place] 8/23 that she will keep. KIK

## 2012-02-04 NOTE — Telephone Encounter (Signed)
Please advise - I do not know what/where [facility] is

## 2012-02-04 NOTE — Telephone Encounter (Signed)
[facility];  Patient saw Dr Caryl Never 5 days ago and has also seen Ortho for Lumbar radiculapathy; appt for second opinion

## 2012-02-07 ENCOUNTER — Encounter: Payer: Self-pay | Admitting: Internal Medicine

## 2012-02-07 ENCOUNTER — Ambulatory Visit (INDEPENDENT_AMBULATORY_CARE_PROVIDER_SITE_OTHER): Payer: BC Managed Care – PPO | Admitting: Internal Medicine

## 2012-02-07 VITALS — BP 120/78 | HR 114 | Temp 97.6°F | Wt 176.0 lb

## 2012-02-07 DIAGNOSIS — E785 Hyperlipidemia, unspecified: Secondary | ICD-10-CM

## 2012-02-07 DIAGNOSIS — M1990 Unspecified osteoarthritis, unspecified site: Secondary | ICD-10-CM

## 2012-02-07 DIAGNOSIS — I10 Essential (primary) hypertension: Secondary | ICD-10-CM

## 2012-02-07 DIAGNOSIS — M549 Dorsalgia, unspecified: Secondary | ICD-10-CM

## 2012-02-07 NOTE — Progress Notes (Signed)
Subjective:    Patient ID: Catherine Craig, female    DOB: 1955/08/11, 56 y.o.   MRN: [id number]  HPI  56 year old patient who has a history of osteoarthritis and fibromyalgia who has been followed by orthopedics to 2 worsening low back pain with suspected left radiculopathy. She has been referred by orthopedics to neurosurgery but the appointment has been delayed. She presently is on a prednisone taper and still quite comfortable with left lumbar and left leg pain.  Past Medical History  Diagnosis Date  . ADHD 02/10/2007  . ALLERGIC RHINITIS 12/15/2006  . BASAL CELL CARCINOMA, HX OF 07/18/2009  . FIBROMYALGIA 12/15/2006  . GERD 12/15/2006  . HYPERLIPIDEMIA 12/15/2006  . HYPERTENSION 12/15/2006  . MENOPAUSAL SYNDROME 12/15/2006  . OSTEOARTHRITIS 02/11/2008  . Renal colic 07/05/2008  . VAGINITIS, ATROPHIC 01/05/2007  . Histoplasmosis with retinitis     History   Social History  . Marital Status: Divorced    Spouse Name: N/A    Number of Children: N/A  . Years of Education: N/A   Occupational History  . Not on file.   Social History Main Topics  . Smoking status: Current Everyday Smoker    Types: Cigarettes  . Smokeless tobacco: Never Used   Comment: less than a pack per pt   . Alcohol Use: Not on file  . Drug Use: Not on file  . Sexually Active: Not on file   Other Topics Concern  . Not on file   Social History Narrative  . No narrative on file    Past Surgical History  Procedure Date  . Appendectomy   . Tonsillectomy   . Dilation and curettage of uterus     Family History  Problem Relation Age of Onset  . COPD Neg Hx     family  . Heart disease Neg Hx     family  . ADD / ADHD Neg Hx     family    Allergies  Allergen Reactions  . Chantix (Varenicline Tartrate) Nausea And Vomiting    Current Outpatient Prescriptions on File Prior to Visit  Medication Sig Dispense Refill  . amphetamine-dextroamphetamine (ADDERALL) 10 MG tablet Take 1 tablet (10 mg total) by  mouth 2 (two) times daily.  60 tablet  0  . aspirin 325 MG tablet Take 325 mg by mouth daily.        . budesonide-formoterol (SYMBICORT) 160-4.5 MCG/ACT inhaler Inhale 2 puffs into the lungs 2 (two) times daily.  3 Inhaler  3  . HYDROcodone-acetaminophen (NORCO) 10-325 MG per tablet Take 1 tablet by mouth every 6 (six) hours as needed. DO NOT EXCEED 4 IN 24HRS  120 tablet  3  . predniSONE (DELTASONE) 10 MG tablet Taper as follows: 6-6-4-4-4-3-3-2-2-1-1  36 tablet  0  . rosuvastatin (CRESTOR) 10 MG tablet Take 1 tablet (10 mg total) by mouth at bedtime.  90 tablet  3  . triamterene-hydrochlorothiazide (MAXZIDE) 75-50 MG per tablet 1/2 tablet daily  90 tablet  3  . furosemide (LASIX) 20 MG tablet Take 1 tablet (20 mg total) by mouth as needed.  90 tablet  3  . DISCONTD: fluticasone (FLONASE) 50 MCG/ACT nasal spray Place 1 spray into the nose daily.  16 g  2  . DISCONTD: pravastatin (PRAVACHOL) 40 MG tablet Take 1 tablet (40 mg total) by mouth every evening.  90 tablet  3    BP 120/78  Pulse 114  Temp 97.6 F (36.4 C) (Oral)  Wt 176 lb (79.833 kg)  SpO2 96%      Review of Systems  Constitutional: Negative.   HENT: Negative for hearing loss, congestion, sore throat, rhinorrhea, dental problem, sinus pressure and tinnitus.   Eyes: Negative for pain, discharge and visual disturbance.  Respiratory: Negative for cough and shortness of breath.   Cardiovascular: Negative for chest pain, palpitations and leg swelling.  Gastrointestinal: Negative for nausea, vomiting, abdominal pain, diarrhea, constipation, blood in stool and abdominal distention.  Genitourinary: Negative for dysuria, urgency, frequency, hematuria, flank pain, vaginal bleeding, vaginal discharge, difficulty urinating, vaginal pain and pelvic pain.  Musculoskeletal: Positive for back pain and gait problem. Negative for joint swelling and arthralgias.  Skin: Negative for rash.  Neurological: Negative for dizziness, syncope, speech  difficulty, weakness, numbness and headaches.  Hematological: Negative for adenopathy.  Psychiatric/Behavioral: Negative for behavioral problems, dysphoric mood and agitation. The patient is not nervous/anxious.        Objective:   Physical Exam  Constitutional: She appears well-developed and well-nourished. No distress.       Mild distress at times tearful Blood pressure normal  Musculoskeletal:       Walks bent at the waist Straight leg test on the left did tend aggravate her left lumbar and left leg pain  Patellar and Achilles reflexes intact Plantar flexion and extension normal Able to walk on her toes and heels          Assessment & Plan:        Osteoarthritis with chronic low back pain. Will attempt to expedite the neurosurgical second opinion we'll set up for a pain management consultation Hypertension stable Dyslipidemia

## 2012-02-07 NOTE — Patient Instructions (Signed)
Neurosurgical and pain management referrals as discussed  Return in 3 months for follow-up

## 2012-02-26 ENCOUNTER — Other Ambulatory Visit: Payer: Self-pay | Admitting: Neurosurgery

## 2012-03-19 ENCOUNTER — Ambulatory Visit (INDEPENDENT_AMBULATORY_CARE_PROVIDER_SITE_OTHER): Payer: BC Managed Care – PPO | Admitting: Internal Medicine

## 2012-03-19 ENCOUNTER — Encounter: Payer: Self-pay | Admitting: Internal Medicine

## 2012-03-19 VITALS — BP 128/82 | HR 92 | Temp 98.1°F | Resp 20 | Ht 62.0 in | Wt 173.0 lb

## 2012-03-19 DIAGNOSIS — Z720 Tobacco use: Secondary | ICD-10-CM

## 2012-03-19 DIAGNOSIS — Z23 Encounter for immunization: Secondary | ICD-10-CM

## 2012-03-19 DIAGNOSIS — E785 Hyperlipidemia, unspecified: Secondary | ICD-10-CM

## 2012-03-19 DIAGNOSIS — M1990 Unspecified osteoarthritis, unspecified site: Secondary | ICD-10-CM

## 2012-03-19 DIAGNOSIS — F17200 Nicotine dependence, unspecified, uncomplicated: Secondary | ICD-10-CM

## 2012-03-19 DIAGNOSIS — Z0000 Encounter for general adult medical examination without abnormal findings: Secondary | ICD-10-CM

## 2012-03-19 DIAGNOSIS — I517 Cardiomegaly: Secondary | ICD-10-CM

## 2012-03-19 DIAGNOSIS — I10 Essential (primary) hypertension: Secondary | ICD-10-CM

## 2012-03-19 MED ORDER — ALPRAZOLAM 0.25 MG PO TABS: 0.2500 mg | ORAL_TABLET | Freq: Two times a day (BID) | ORAL | Status: DC | PRN

## 2012-03-19 NOTE — Patient Instructions (Signed)
Smoking tobacco is very bad for your health. You should stop smoking immediately.  Echocardiogram and stress test as discussed

## 2012-03-19 NOTE — Progress Notes (Signed)
Subjective:    Patient ID: Catherine Craig, female    DOB: 1956-03-14, 56 y.o.   MRN: [id number]  HPI  56 year old patient who is seen today for followup and a preoperative assessment. She is scheduled for elective back surgery on October 31. Cardiovascular risk factors include hypertension dyslipidemia and tobacco use.  Her father died of complications of congestive heart failure at age 2 and suffered an MI in his 28s. Her mother died of complications of a stroke postoperatively at age 76. She has recently switched to a left chronic cigarettes but has been a fairly chronic smoker since age 18. She has had considerable back and left leg pain over the last 6 months which has limited her activities. She does feel she is a bit deconditioned. She lives in a town home and is up and down stairs throughout the day. She does note some increase in exertional fatigue and mild dyspnea over the past 6 months since she has not been as active. She denies any exertional chest pain. She feels that she is deconditioned. Denies any wheezing.  She does have a history of tobacco associated chronic bronchitis and she does use Symbicort sporadically with benefit.  A chest x-ray in May revealed no active cardiopulmonary disease.  Past Medical History  Diagnosis Date  . ADHD 02/10/2007  . ALLERGIC RHINITIS 12/15/2006  . BASAL CELL CARCINOMA, HX OF 07/18/2009  . FIBROMYALGIA 12/15/2006  . GERD 12/15/2006  . HYPERLIPIDEMIA 12/15/2006  . HYPERTENSION 12/15/2006  . MENOPAUSAL SYNDROME 12/15/2006  . OSTEOARTHRITIS 02/11/2008  . Renal colic 07/05/2008  . VAGINITIS, ATROPHIC 01/05/2007  . Histoplasmosis with retinitis     History   Social History  . Marital Status: Divorced    Spouse Name: N/A    Number of Children: N/A  . Years of Education: N/A   Occupational History  . Not on file.   Social History Main Topics  . Smoking status: Current Every Day Smoker    Types: Cigarettes  . Smokeless tobacco: Never Used   Comment:  using electronic cigarette as of 03/2012  . Alcohol Use: Not on file  . Drug Use: Not on file  . Sexually Active: Not on file   Other Topics Concern  . Not on file   Social History Narrative  . No narrative on file    Past Surgical History  Procedure Date  . Appendectomy   . Tonsillectomy   . Dilation and curettage of uterus     Family History  Problem Relation Age of Onset  . COPD Neg Hx     family  . Heart disease Neg Hx     family  . ADD / ADHD Neg Hx     family    Allergies  Allergen Reactions  . Chantix (Varenicline Tartrate) Nausea And Vomiting    Current Outpatient Prescriptions on File Prior to Visit  Medication Sig Dispense Refill  . amphetamine-dextroamphetamine (ADDERALL) 10 MG tablet Take 1 tablet (10 mg total) by mouth 2 (two) times daily.  60 tablet  0  . aspirin 325 MG tablet Take 325 mg by mouth daily.        . budesonide-formoterol (SYMBICORT) 160-4.5 MCG/ACT inhaler Inhale 2 puffs into the lungs 2 (two) times daily.  3 Inhaler  3  . furosemide (LASIX) 20 MG tablet Take 1 tablet (20 mg total) by mouth as needed.  90 tablet  3  . HYDROcodone-acetaminophen (NORCO) 10-325 MG per tablet Take 1 tablet by mouth every 6 (six)  hours as needed. DO NOT EXCEED 4 IN 24HRS  120 tablet  3  . rosuvastatin (CRESTOR) 10 MG tablet Take 1 tablet (10 mg total) by mouth at bedtime.  90 tablet  3  . triamterene-hydrochlorothiazide (MAXZIDE) 75-50 MG per tablet 1/2 tablet daily  90 tablet  3  . DISCONTD: fluticasone (FLONASE) 50 MCG/ACT nasal spray Place 1 spray into the nose daily.  16 g  2  . DISCONTD: pravastatin (PRAVACHOL) 40 MG tablet Take 1 tablet (40 mg total) by mouth every evening.  90 tablet  3    BP 128/82  Pulse 92  Temp 98.1 F (36.7 C) (Oral)  Resp 20  Ht 5\' 2"  (1.575 m)  Wt 173 lb (78.472 kg)  BMI 31.64 kg/m2  SpO2 98%       Review of Systems  Constitutional: Positive for fatigue.  HENT: Negative for hearing loss, congestion, sore throat,  rhinorrhea, dental problem, sinus pressure and tinnitus.   Eyes: Negative for pain, discharge and visual disturbance.  Respiratory: Negative for cough, chest tightness and shortness of breath.   Cardiovascular: Negative for chest pain, palpitations and leg swelling.  Gastrointestinal: Negative for nausea, vomiting, abdominal pain, diarrhea, constipation, blood in stool and abdominal distention.  Genitourinary: Negative for dysuria, urgency, frequency, hematuria, flank pain, vaginal bleeding, vaginal discharge, difficulty urinating, vaginal pain and pelvic pain.  Musculoskeletal: Negative for joint swelling, arthralgias and gait problem.  Skin: Negative for rash.  Neurological: Negative for dizziness, syncope, speech difficulty, weakness, numbness and headaches.  Hematological: Negative for adenopathy.  Psychiatric/Behavioral: Negative for behavioral problems, dysphoric mood and agitation. The patient is not nervous/anxious.        Objective:   Physical Exam  Constitutional: She is oriented to person, place, and time. She appears well-developed and well-nourished. No distress.       Blood pressure 120/80 Anxious but in no acute distress  HENT:  Head: Normocephalic.  Right Ear: External ear normal.  Left Ear: External ear normal.  Mouth/Throat: Oropharynx is clear and moist.  Eyes: Conjunctivae normal and EOM are normal. Pupils are equal, round, and reactive to light.  Neck: Normal range of motion. Neck supple. No thyromegaly present.  Cardiovascular: Regular rhythm, normal heart sounds and intact distal pulses.        Resting tachycardia  Pulmonary/Chest: Effort normal and breath sounds normal. No respiratory distress. She has no wheezes. She has no rales. She exhibits no tenderness.       O2 saturation 98  Abdominal: Soft. Bowel sounds are normal. She exhibits no mass. There is no tenderness.  Musculoskeletal: Normal range of motion.  Lymphadenopathy:    She has no cervical  adenopathy.  Neurological: She is alert and oriented to person, place, and time.  Skin: Skin is warm and dry. No rash noted.  Psychiatric: She has a normal mood and affect. Her behavior is normal.          Assessment & Plan:   Chronic low back pain with radiculopathy Hypertension well controlled Dyslipidemia Tobacco use. Recently discontinued  Patient has multiple cardiac risk factors but no active cardiopulmonary complaints. No significant past history of cardiac disease. Clinically she has been stable although a bit more deconditioned over the past 6 months due to forced inactivity. We'll review a 12-lead EKG. If this is normal no further preoperative evaluation necessary. Preoperative total cessation tobacco use discussed and encouraged.  EKG reviewed and bothersome for RVH. We'll set up for a 2-D echocardiogram as well as a  nuclear stress test

## 2012-03-20 ENCOUNTER — Other Ambulatory Visit: Payer: Self-pay | Admitting: Internal Medicine

## 2012-03-20 DIAGNOSIS — I517 Cardiomegaly: Secondary | ICD-10-CM

## 2012-03-26 ENCOUNTER — Ambulatory Visit (HOSPITAL_COMMUNITY): Payer: BC Managed Care – PPO | Attending: Cardiology | Admitting: Radiology

## 2012-03-26 VITALS — BP 121/78 | Ht 62.0 in | Wt 170.0 lb

## 2012-03-26 DIAGNOSIS — J449 Chronic obstructive pulmonary disease, unspecified: Secondary | ICD-10-CM | POA: Insufficient documentation

## 2012-03-26 DIAGNOSIS — Z8249 Family history of ischemic heart disease and other diseases of the circulatory system: Secondary | ICD-10-CM | POA: Insufficient documentation

## 2012-03-26 DIAGNOSIS — J4489 Other specified chronic obstructive pulmonary disease: Secondary | ICD-10-CM | POA: Insufficient documentation

## 2012-03-26 DIAGNOSIS — I517 Cardiomegaly: Secondary | ICD-10-CM

## 2012-03-26 DIAGNOSIS — R0609 Other forms of dyspnea: Secondary | ICD-10-CM | POA: Insufficient documentation

## 2012-03-26 DIAGNOSIS — R0989 Other specified symptoms and signs involving the circulatory and respiratory systems: Secondary | ICD-10-CM | POA: Insufficient documentation

## 2012-03-26 DIAGNOSIS — F17200 Nicotine dependence, unspecified, uncomplicated: Secondary | ICD-10-CM | POA: Insufficient documentation

## 2012-03-26 DIAGNOSIS — I10 Essential (primary) hypertension: Secondary | ICD-10-CM | POA: Insufficient documentation

## 2012-03-26 MED ORDER — TECHNETIUM TC 99M SESTAMIBI GENERIC - CARDIOLITE
11.0000 | Freq: Once | INTRAVENOUS | Status: AC | PRN
  Administered 2012-03-26: 11 via INTRAVENOUS

## 2012-03-26 MED ORDER — REGADENOSON 0.4 MG/5ML IV SOLN
0.4000 mg | Freq: Once | INTRAVENOUS | Status: AC
  Administered 2012-03-26: 0.4 mg via INTRAVENOUS

## 2012-03-26 MED ORDER — TECHNETIUM TC 99M SESTAMIBI GENERIC - CARDIOLITE
33.0000 | Freq: Once | INTRAVENOUS | Status: AC | PRN
  Administered 2012-03-26: 33 via INTRAVENOUS

## 2012-03-26 NOTE — Progress Notes (Signed)
[facility] SITE 3 NUCLEAR MED [id number] [place] [address] [phone]  Cardiology Nuclear Med Study  Catherine Craig is a 56 y.o. female     MRN : [id number]     DOB: February 27, 1956  Procedure Date: 03/26/2012  Nuclear Med Background Indication for Stress Test:  Evaluation for Ischemia, Abnormal EKG, and Surgical Clearance for  Elective Back Surgery on 04-16-12 by Shirlean Kelly, MD History:  COPD and 01/03/09 EF: 50-55% moderate to severe MR replace ECHO 03/27/12 Cardiac Risk Factors: Family History - CAD, Hypertension, Lipids and Smoker  Symptoms:  DOE and Fatigue with Exertion   Nuclear Pre-Procedure Caffeine/Decaff Intake:  11:30pm, decaffeinated tea NPO After: 11:30pm   Lungs:  clear O2 Sat: 97% on room air. IV 0.9% NS with Angio Cath:  22g  IV Site: R Wrist x 1, tolerated well IV Started by:  Irean Hong, RN  Chest Size (in):  40 Cup Size: DD  Height: 5\' 2"  (1.575 m)  Weight:  170 lb (77.111 kg)  BMI:  Body mass index is 31.09 kg/(m^2). Tech Comments:  n/a   40 Nuclear Med Study 1 or 2 day study: 1 day  Stress Test Type:  Lexiscan  Reading MD: Marca Ancona, MD  Order Authorizing Provider:  Eleonore Chiquito, MD  Resting Radionuclide: Technetium 53m Sestamibi  Resting Radionuclide Dose: 11.0 mCi   Stress Radionuclide:  Technetium 26m Sestamibi  Stress Radionuclide Dose: 33.0 mCi           Stress Protocol Rest HR: 111 Stress HR: 120  Rest BP: 121/78 Stress BP: 137/84  Exercise Time (min): n/a METS: n/a   Predicted Max HR: 162 bpm % Max HR: 74.07 bpm Rate Pressure Product: 24401   Dose of Adenosine (mg):  n/a Dose of Lexiscan: 0.4 mg  Dose of Atropine (mg): n/a Dose of Dobutamine: n/a mcg/kg/min (at max HR)  Stress Test Technologist: Milana Na, EMT-P  Nuclear Technologist:  Domenic Polite, CNMT     Rest Procedure:  Myocardial perfusion imaging was performed at rest 45 minutes following the intravenous  administration of Technetium 72m Sestamibi. Rest ECG: Sinus Tachycardia  Stress Procedure:  The patient received IV Lexiscan 0.4 mg over 15-seconds.  Technetium 51m Sestamibi injected at 30-seconds.  There were no significant changes, flushed, and abdominal pain with Lexiscan.  Quantitative spect images were obtained after a 45 minute delay. Stress ECG: No significant change from baseline ECG  QPS Raw Data Images:  Normal; no motion artifact; normal heart/lung ratio. Stress Images:  Normal homogeneous uptake in all areas of the myocardium. Rest Images:  Normal homogeneous uptake in all areas of the myocardium. Subtraction (SDS):  There is no evidence of scar or ischemia. Transient Ischemic Dilatation (Normal <1.22):  1.08 Lung/Heart Ratio (Normal <0.45):  0.30  Quantitative Gated Spect Images QGS EDV:  45 ml QGS ESV:  8 ml  Impression Exercise Capacity:  Lexiscan with no exercise. BP Response:  Normal blood pressure response. Clinical Symptoms:  Flushed, abdominal pain.  ECG Impression:  No significant ST segment change suggestive of ischemia. Comparison with Prior Nuclear Study: No images to compare  Overall Impression:  Normal stress nuclear study.  LV Ejection Fraction: 83%.  LV Wall Motion:  NL LV Function; NL Wall Motion  Marca Ancona 03/26/2012

## 2012-03-27 ENCOUNTER — Ambulatory Visit (HOSPITAL_COMMUNITY): Payer: BC Managed Care – PPO | Attending: Cardiovascular Disease

## 2012-03-27 ENCOUNTER — Telehealth: Payer: Self-pay | Admitting: Internal Medicine

## 2012-03-27 DIAGNOSIS — I517 Cardiomegaly: Secondary | ICD-10-CM

## 2012-03-27 DIAGNOSIS — I379 Nonrheumatic pulmonary valve disorder, unspecified: Secondary | ICD-10-CM | POA: Insufficient documentation

## 2012-03-27 DIAGNOSIS — R0989 Other specified symptoms and signs involving the circulatory and respiratory systems: Secondary | ICD-10-CM | POA: Insufficient documentation

## 2012-03-27 DIAGNOSIS — F17200 Nicotine dependence, unspecified, uncomplicated: Secondary | ICD-10-CM | POA: Insufficient documentation

## 2012-03-27 DIAGNOSIS — I369 Nonrheumatic tricuspid valve disorder, unspecified: Secondary | ICD-10-CM | POA: Insufficient documentation

## 2012-03-27 DIAGNOSIS — I10 Essential (primary) hypertension: Secondary | ICD-10-CM | POA: Insufficient documentation

## 2012-03-27 DIAGNOSIS — I080 Rheumatic disorders of both mitral and aortic valves: Secondary | ICD-10-CM | POA: Insufficient documentation

## 2012-03-27 DIAGNOSIS — R0609 Other forms of dyspnea: Secondary | ICD-10-CM | POA: Insufficient documentation

## 2012-03-27 NOTE — Telephone Encounter (Signed)
Please notify patient that her stress test was normal. Her echocardiogram revealed some slight muscle hypertrophy but otherwise normal. Okay to proceed with surgery

## 2012-03-27 NOTE — Telephone Encounter (Signed)
Spoke with pt- informed of dr. Vernon Prey oinstructions

## 2012-03-27 NOTE — Telephone Encounter (Signed)
Please advise 

## 2012-03-27 NOTE — Telephone Encounter (Signed)
Pt would like results of echo and stress test done at [facility] cardiology.

## 2012-03-27 NOTE — Progress Notes (Signed)
Echocardiogram performed.  

## 2012-04-06 ENCOUNTER — Encounter (HOSPITAL_COMMUNITY): Payer: Self-pay | Admitting: Pharmacy Technician

## 2012-04-09 ENCOUNTER — Encounter (HOSPITAL_COMMUNITY): Payer: Self-pay

## 2012-04-09 ENCOUNTER — Other Ambulatory Visit: Payer: Self-pay | Admitting: Internal Medicine

## 2012-04-09 ENCOUNTER — Encounter (HOSPITAL_COMMUNITY)
Admission: RE | Admit: 2012-04-09 | Discharge: 2012-04-09 | Disposition: A | Discharge status: Home / Self Care | Payer: BC Managed Care – PPO | Source: Ambulatory Visit | Attending: Neurosurgery | Admitting: Neurosurgery

## 2012-04-09 DIAGNOSIS — Z1231 Encounter for screening mammogram for malignant neoplasm of breast: Secondary | ICD-10-CM

## 2012-04-09 HISTORY — DX: Benign lipomatous neoplasm, unspecified: D17.9

## 2012-04-09 HISTORY — DX: Anxiety disorder, unspecified: F41.9

## 2012-04-09 HISTORY — DX: Irritable bowel syndrome, unspecified: K58.9

## 2012-04-09 HISTORY — DX: Brachial plexus disorders: G54.0

## 2012-04-09 HISTORY — DX: Anal fissure, unspecified: K60.2

## 2012-04-09 HISTORY — DX: Fibromyalgia: M79.7

## 2012-04-09 LAB — BASIC METABOLIC PANEL
BUN: 11 mg/dL (ref 6–23)
CO2: 26 mEq/L (ref 19–32)
Calcium: 9.5 mg/dL (ref 8.4–10.5)
Chloride: 105 mEq/L (ref 96–112)
Creatinine, Ser: 0.87 mg/dL (ref 0.50–1.10)
GFR calc Af Amer: 83 mL/min — ABNORMAL LOW (ref >90)
GFR calc non Af Amer: 72 mL/min — ABNORMAL LOW (ref >90)
Glucose, Bld: 92 mg/dL (ref 70–99)
Potassium: 3.7 mEq/L (ref 3.5–5.1)
Sodium: 142 mEq/L (ref 135–145)

## 2012-04-09 LAB — CBC
HCT: 39.3 % (ref 36.0–46.0)
Hemoglobin: 13.7 g/dL (ref 12.0–15.0)
MCH: 32.9 pg (ref 26.0–34.0)
MCHC: 34.9 g/dL (ref 30.0–36.0)
MCV: 94.5 fL (ref 78.0–100.0)
Platelets: 279 10*3/uL (ref 150–400)
RBC: 4.16 MIL/uL (ref 3.87–5.11)
RDW: 13.9 % (ref 11.5–15.5)
WBC: 11.1 10*3/uL — ABNORMAL HIGH (ref 4.0–10.5)

## 2012-04-09 LAB — SURGICAL PCR SCREEN
MRSA, PCR: NEGATIVE
Staphylococcus aureus: NEGATIVE

## 2012-04-09 LAB — TYPE AND SCREEN
ABO/RH(D): A NEG
Antibody Screen: NEGATIVE

## 2012-04-09 LAB — ABO/RH: ABO/RH(D): A NEG

## 2012-04-09 NOTE — Pre-Procedure Instructions (Signed)
20 Catherine Craig  04/09/2012   Your procedure is scheduled on:  Thursday, October 31st.  Report to [facility] at 5:30AM.  Call this number if you have problems the morning of surgery: [phone]   Remember:   Do not eat food or drink any liquid:After Midnight.    Take these medicines the morning of surgery with A SIP OF WATER: Use Symbicort.  May take Alprazolam (Xanax) and Hydrocodone- Acetaminophen (Norco) if needed.  Stop taking any Aspirin, NSAIDs , Coumadin, Plavix, Effient and Herbal Medications.   Do not wear jewelry, make-up or nail polish.  Do not wear lotions, powders, or perfumes. You may wear deodorant.  Do not shave 48 hours prior to surgery. Men may shave face and neck.  Do not bring valuables to the hospital.  Contacts, dentures or bridgework may not be worn into surgery.  Leave suitcase in the car. After surgery it may be brought to your room.  For patients admitted to the hospital, checkout time is 11:00 AM the day of discharge.   Patients discharged the day of surgery will not be allowed to drive home.  Name and phone number of your driver: NA  Special Instructions: Shower using CHG 2 nights before surgery and the night before surgery.  If you shower the day of surgery use CHG.  Use special wash - you have one bottle of CHG for all showers.  You should use approximately 1/3 of the bottle for each shower.   Please read over the following fact sheets that you were given: Pain Booklet, Coughing and Deep Breathing, Blood Transfusion Information and Surgical Site Infection Prevention

## 2012-04-09 NOTE — Progress Notes (Addendum)
Pt does not see a cardiologist, she did have a  2D echo and stress test earlier this month;  They were ordered by Dr Frederica Kuster.

## 2012-04-15 MED ORDER — CEFAZOLIN SODIUM-DEXTROSE 2-3 GM-% IV SOLR
2.0000 g | INTRAVENOUS | Status: AC
  Administered 2012-04-16: 1 g via INTRAVENOUS
  Administered 2012-04-16: 2 g via INTRAVENOUS

## 2012-04-16 ENCOUNTER — Encounter (HOSPITAL_COMMUNITY): Payer: Self-pay | Admitting: *Deleted

## 2012-04-16 ENCOUNTER — Ambulatory Visit (HOSPITAL_COMMUNITY): Payer: BC Managed Care – PPO | Admitting: Certified Registered"

## 2012-04-16 ENCOUNTER — Encounter (HOSPITAL_COMMUNITY): Payer: Self-pay | Admitting: Certified Registered"

## 2012-04-16 ENCOUNTER — Ambulatory Visit (HOSPITAL_COMMUNITY): Payer: BC Managed Care – PPO

## 2012-04-16 ENCOUNTER — Ambulatory Visit (HOSPITAL_COMMUNITY)
Admission: RE | Admit: 2012-04-16 | Discharge: 2012-04-18 | Disposition: A | Discharge status: Home / Self Care | Payer: BC Managed Care – PPO | Source: Ambulatory Visit | Attending: Neurosurgery | Admitting: Neurosurgery

## 2012-04-16 ENCOUNTER — Encounter (HOSPITAL_COMMUNITY)
Admission: RE | Disposition: A | Discharge status: Home / Self Care | Payer: Self-pay | Source: Ambulatory Visit | Attending: Neurosurgery

## 2012-04-16 DIAGNOSIS — K219 Gastro-esophageal reflux disease without esophagitis: Secondary | ICD-10-CM | POA: Insufficient documentation

## 2012-04-16 DIAGNOSIS — Z01812 Encounter for preprocedural laboratory examination: Secondary | ICD-10-CM | POA: Insufficient documentation

## 2012-04-16 DIAGNOSIS — F17200 Nicotine dependence, unspecified, uncomplicated: Secondary | ICD-10-CM | POA: Insufficient documentation

## 2012-04-16 DIAGNOSIS — E785 Hyperlipidemia, unspecified: Secondary | ICD-10-CM | POA: Insufficient documentation

## 2012-04-16 DIAGNOSIS — I10 Essential (primary) hypertension: Secondary | ICD-10-CM | POA: Insufficient documentation

## 2012-04-16 DIAGNOSIS — M47817 Spondylosis without myelopathy or radiculopathy, lumbosacral region: Secondary | ICD-10-CM | POA: Insufficient documentation

## 2012-04-16 DIAGNOSIS — M1990 Unspecified osteoarthritis, unspecified site: Secondary | ICD-10-CM | POA: Insufficient documentation

## 2012-04-16 HISTORY — PX: SPINE SURGERY: SHX786

## 2012-04-16 SURGERY — POSTERIOR LUMBAR FUSION 2 LEVEL
Anesthesia: General | Site: Spine Lumbar | Wound class: Clean

## 2012-04-16 MED ORDER — SODIUM CHLORIDE 0.9 % IV SOLN: 250.0000 mL | INTRAVENOUS | Status: DC

## 2012-04-16 MED ORDER — KETOROLAC TROMETHAMINE 30 MG/ML IJ SOLN
30.0000 mg | Freq: Four times a day (QID) | INTRAMUSCULAR | Status: AC
  Administered 2012-04-16 – 2012-04-18 (×8): 30 mg via INTRAVENOUS
  Filled 2012-04-16 (×9): qty 1

## 2012-04-16 MED ORDER — EPHEDRINE SULFATE 50 MG/ML IJ SOLN
INTRAMUSCULAR | Status: DC | PRN
  Administered 2012-04-16: 5 mg via INTRAVENOUS
  Administered 2012-04-16: 10 mg via INTRAVENOUS

## 2012-04-16 MED ORDER — SODIUM CHLORIDE 0.9 % IJ SOLN
3.0000 mL | Freq: Two times a day (BID) | INTRAMUSCULAR | Status: DC
  Administered 2012-04-17 (×2): 3 mL via INTRAVENOUS

## 2012-04-16 MED ORDER — ONDANSETRON HCL 4 MG/2ML IJ SOLN
INTRAMUSCULAR | Status: DC | PRN
  Administered 2012-04-16: 4 mg via INTRAVENOUS

## 2012-04-16 MED ORDER — ALUM & MAG HYDROXIDE-SIMETH 200-200-20 MG/5ML PO SUSP: 30.0000 mL | Freq: Four times a day (QID) | ORAL | Status: DC | PRN

## 2012-04-16 MED ORDER — MENTHOL 3 MG MT LOZG: 1.0000 | LOZENGE | OROMUCOSAL | Status: DC | PRN

## 2012-04-16 MED ORDER — MIDAZOLAM HCL 5 MG/5ML IJ SOLN
INTRAMUSCULAR | Status: DC | PRN
  Administered 2012-04-16: 1 mg via INTRAVENOUS

## 2012-04-16 MED ORDER — OXYCODONE HCL 5 MG PO TABS
5.0000 mg | ORAL_TABLET | ORAL | Status: DC | PRN
  Administered 2012-04-16: 10 mg via ORAL
  Administered 2012-04-16: 5 mg via ORAL
  Administered 2012-04-16: 10 mg via ORAL
  Administered 2012-04-17: 5 mg via ORAL
  Administered 2012-04-17 – 2012-04-18 (×5): 10 mg via ORAL
  Filled 2012-04-16 (×8): qty 2

## 2012-04-16 MED ORDER — KETOROLAC TROMETHAMINE 30 MG/ML IJ SOLN: 30.0000 mg | Freq: Once | INTRAMUSCULAR | Status: DC

## 2012-04-16 MED ORDER — 0.9 % SODIUM CHLORIDE (POUR BTL) OPTIME
TOPICAL | Status: DC | PRN
  Administered 2012-04-16 (×2): 1000 mL

## 2012-04-16 MED ORDER — LIDOCAINE-EPINEPHRINE 1 %-1:100000 IJ SOLN
INTRAMUSCULAR | Status: DC | PRN
  Administered 2012-04-16: 15 mL

## 2012-04-16 MED ORDER — ACETAMINOPHEN 10 MG/ML IV SOLN
1000.0000 mg | Freq: Once | INTRAVENOUS | Status: AC
  Administered 2012-04-16: 1000 mg via INTRAVENOUS
  Filled 2012-04-16: qty 100

## 2012-04-16 MED ORDER — KETOROLAC TROMETHAMINE 30 MG/ML IJ SOLN
INTRAMUSCULAR | Status: AC
  Filled 2012-04-16: qty 1

## 2012-04-16 MED ORDER — CEFAZOLIN SODIUM-DEXTROSE 2-3 GM-% IV SOLR
INTRAVENOUS | Status: AC
  Filled 2012-04-16: qty 50

## 2012-04-16 MED ORDER — BUDESONIDE-FORMOTEROL FUMARATE 160-4.5 MCG/ACT IN AERO
2.0000 | INHALATION_SPRAY | Freq: Two times a day (BID) | RESPIRATORY_TRACT | Status: DC
  Administered 2012-04-16 – 2012-04-18 (×4): 2 via RESPIRATORY_TRACT
  Filled 2012-04-16: qty 6

## 2012-04-16 MED ORDER — HYDROXYZINE HCL 50 MG/ML IM SOLN: 50.0000 mg | INTRAMUSCULAR | Status: DC | PRN

## 2012-04-16 MED ORDER — BUPIVACAINE HCL (PF) 0.5 % IJ SOLN
INTRAMUSCULAR | Status: DC | PRN
  Administered 2012-04-16: 15 mL

## 2012-04-16 MED ORDER — ACETAMINOPHEN 650 MG RE SUPP: 650.0000 mg | RECTAL | Status: DC | PRN

## 2012-04-16 MED ORDER — NEOSTIGMINE METHYLSULFATE 1 MG/ML IJ SOLN
INTRAMUSCULAR | Status: DC | PRN
  Administered 2012-04-16: 3 mg via INTRAVENOUS
  Administered 2012-04-16: 1 mg via INTRAVENOUS

## 2012-04-16 MED ORDER — CYCLOBENZAPRINE HCL 10 MG PO TABS
10.0000 mg | ORAL_TABLET | Freq: Three times a day (TID) | ORAL | Status: DC | PRN
  Administered 2012-04-16 – 2012-04-18 (×4): 10 mg via ORAL
  Filled 2012-04-16 (×3): qty 1

## 2012-04-16 MED ORDER — LIDOCAINE HCL 4 % MT SOLN
OROMUCOSAL | Status: DC | PRN
  Administered 2012-04-16: 4 mL via TOPICAL

## 2012-04-16 MED ORDER — DROPERIDOL 2.5 MG/ML IJ SOLN
2.5000 mg | Freq: Once | INTRAMUSCULAR | Status: DC
  Filled 2012-04-16: qty 1

## 2012-04-16 MED ORDER — ACETAMINOPHEN 325 MG PO TABS: 650.0000 mg | ORAL_TABLET | ORAL | Status: DC | PRN

## 2012-04-16 MED ORDER — ROCURONIUM BROMIDE 100 MG/10ML IV SOLN
INTRAVENOUS | Status: DC | PRN
  Administered 2012-04-16: 10 mg via INTRAVENOUS
  Administered 2012-04-16: 20 mg via INTRAVENOUS
  Administered 2012-04-16 (×3): 10 mg via INTRAVENOUS
  Administered 2012-04-16: 50 mg via INTRAVENOUS

## 2012-04-16 MED ORDER — MORPHINE SULFATE 4 MG/ML IJ SOLN
4.0000 mg | INTRAMUSCULAR | Status: DC | PRN
  Administered 2012-04-16 – 2012-04-17 (×4): 4 mg via INTRAMUSCULAR
  Administered 2012-04-17: 8 mg via INTRAMUSCULAR
  Administered 2012-04-17: 4 mg via INTRAMUSCULAR
  Filled 2012-04-16: qty 1
  Filled 2012-04-16: qty 2
  Filled 2012-04-16 (×4): qty 1

## 2012-04-16 MED ORDER — TRIAMTERENE-HCTZ 37.5-25 MG PO TABS
1.0000 | ORAL_TABLET | Freq: Every day | ORAL | Status: DC
  Administered 2012-04-16 – 2012-04-17 (×2): 1 via ORAL
  Filled 2012-04-16 (×3): qty 1

## 2012-04-16 MED ORDER — LACTATED RINGERS IV SOLN
INTRAVENOUS | Status: DC | PRN
  Administered 2012-04-16 (×4): via INTRAVENOUS

## 2012-04-16 MED ORDER — SODIUM CHLORIDE 0.9 % IV SOLN
INTRAVENOUS | Status: AC
  Filled 2012-04-16: qty 500

## 2012-04-16 MED ORDER — HYDROMORPHONE HCL PF 1 MG/ML IJ SOLN: 0.2500 mg | INTRAMUSCULAR | Status: DC | PRN

## 2012-04-16 MED ORDER — PHENOL 1.4 % MT LIQD: 1.0000 | OROMUCOSAL | Status: DC | PRN

## 2012-04-16 MED ORDER — ZOLPIDEM TARTRATE 5 MG PO TABS: 5.0000 mg | ORAL_TABLET | Freq: Every evening | ORAL | Status: DC | PRN

## 2012-04-16 MED ORDER — PROPOFOL 10 MG/ML IV BOLUS
INTRAVENOUS | Status: DC | PRN
  Administered 2012-04-16: 200 mg via INTRAVENOUS

## 2012-04-16 MED ORDER — ONDANSETRON HCL 4 MG/2ML IJ SOLN: 4.0000 mg | Freq: Once | INTRAMUSCULAR | Status: DC | PRN

## 2012-04-16 MED ORDER — ARTIFICIAL TEARS OP OINT
TOPICAL_OINTMENT | OPHTHALMIC | Status: DC | PRN
  Administered 2012-04-16: 1 via OPHTHALMIC

## 2012-04-16 MED ORDER — FENTANYL CITRATE 0.05 MG/ML IJ SOLN
INTRAMUSCULAR | Status: DC | PRN
  Administered 2012-04-16 (×5): 50 ug via INTRAVENOUS
  Administered 2012-04-16: 75 ug via INTRAVENOUS
  Administered 2012-04-16 (×2): 50 ug via INTRAVENOUS
  Administered 2012-04-16: 125 ug via INTRAVENOUS

## 2012-04-16 MED ORDER — GELATIN ABSORBABLE MT POWD
OROMUCOSAL | Status: DC | PRN
  Administered 2012-04-16: 12:00:00 via TOPICAL

## 2012-04-16 MED ORDER — THROMBIN 20000 UNITS EX SOLR
CUTANEOUS | Status: DC | PRN
  Administered 2012-04-16: 09:00:00 via TOPICAL

## 2012-04-16 MED ORDER — HYDROXYZINE HCL 25 MG PO TABS: 50.0000 mg | ORAL_TABLET | ORAL | Status: DC | PRN

## 2012-04-16 MED ORDER — BACITRACIN 50000 UNITS IM SOLR
INTRAMUSCULAR | Status: AC
  Filled 2012-04-16: qty 1

## 2012-04-16 MED ORDER — TRIAMTERENE-HCTZ 75-50 MG PO TABS
0.5000 | ORAL_TABLET | Freq: Every day | ORAL | Status: DC
  Filled 2012-04-16: qty 0.5

## 2012-04-16 MED ORDER — GLYCOPYRROLATE 0.2 MG/ML IJ SOLN
INTRAMUSCULAR | Status: DC | PRN
  Administered 2012-04-16: 0.4 mg via INTRAVENOUS

## 2012-04-16 MED ORDER — BISACODYL 10 MG RE SUPP: 10.0000 mg | Freq: Every day | RECTAL | Status: DC | PRN

## 2012-04-16 MED ORDER — ACETAMINOPHEN 10 MG/ML IV SOLN
1000.0000 mg | Freq: Four times a day (QID) | INTRAVENOUS | Status: AC
  Administered 2012-04-16 – 2012-04-18 (×7): 1000 mg via INTRAVENOUS
  Filled 2012-04-16 (×7): qty 100

## 2012-04-16 MED ORDER — KCL IN DEXTROSE-NACL 20-5-0.45 MEQ/L-%-% IV SOLN
INTRAVENOUS | Status: DC
  Filled 2012-04-16 (×6): qty 1000

## 2012-04-16 MED ORDER — PHENYLEPHRINE HCL 10 MG/ML IJ SOLN
10.0000 mg | INTRAVENOUS | Status: DC | PRN
  Administered 2012-04-16: 25 ug/min via INTRAVENOUS

## 2012-04-16 MED ORDER — SODIUM CHLORIDE 0.9 % IJ SOLN: 3.0000 mL | INTRAMUSCULAR | Status: DC | PRN

## 2012-04-16 MED ORDER — CEFAZOLIN SODIUM 1-5 GM-% IV SOLN
INTRAVENOUS | Status: AC
  Filled 2012-04-16: qty 50

## 2012-04-16 MED ORDER — ALPRAZOLAM 0.25 MG PO TABS: 0.2500 mg | ORAL_TABLET | Freq: Two times a day (BID) | ORAL | Status: DC | PRN

## 2012-04-16 MED ORDER — ATORVASTATIN CALCIUM 20 MG PO TABS
20.0000 mg | ORAL_TABLET | Freq: Every day | ORAL | Status: DC
  Administered 2012-04-16 – 2012-04-17 (×2): 20 mg via ORAL
  Filled 2012-04-16 (×3): qty 1

## 2012-04-16 MED ORDER — SODIUM CHLORIDE 0.9 % IR SOLN
Status: DC | PRN
  Administered 2012-04-16 (×2)

## 2012-04-16 MED ORDER — LIDOCAINE HCL (CARDIAC) 20 MG/ML IV SOLN
INTRAVENOUS | Status: DC | PRN
  Administered 2012-04-16: 100 mg via INTRAVENOUS

## 2012-04-16 MED ORDER — PHENYLEPHRINE HCL 10 MG/ML IJ SOLN
INTRAMUSCULAR | Status: DC | PRN
  Administered 2012-04-16 (×6): 80 ug via INTRAVENOUS

## 2012-04-16 MED ORDER — OXYCODONE HCL 5 MG PO TABS
ORAL_TABLET | ORAL | Status: AC
  Filled 2012-04-16: qty 2

## 2012-04-16 MED ORDER — MAGNESIUM HYDROXIDE 400 MG/5ML PO SUSP: 30.0000 mL | Freq: Every day | ORAL | Status: DC | PRN

## 2012-04-16 SURGICAL SUPPLY — 76 items
BAG DECANTER FOR FLEXI CONT (MISCELLANEOUS) ×2 IMPLANT
BENZOIN TINCTURE PRP APPL 2/3 (GAUZE/BANDAGES/DRESSINGS) IMPLANT
BLADE SURG ROTATE 9660 (MISCELLANEOUS) IMPLANT
BRUSH SCRUB EZ PLAIN DRY (MISCELLANEOUS) ×2 IMPLANT
BUR ACORN 6.0 ACORN (BURR) IMPLANT
BUR ACRON 5.0MM COATED (BURR) ×2 IMPLANT
BUR MATCHSTICK NEURO 3.0 LAGG (BURR) ×2 IMPLANT
CANISTER SUCTION 2500CC (MISCELLANEOUS) ×2 IMPLANT
CAP LCK SPNE (Orthopedic Implant) ×6 IMPLANT
CAP LOCK SPINE RADIUS (Orthopedic Implant) ×6 IMPLANT
CAP LOCKING (Orthopedic Implant) ×6 IMPLANT
CLOTH BEACON ORANGE TIMEOUT ST (SAFETY) ×2 IMPLANT
CONT SPEC 4OZ CLIKSEAL STRL BL (MISCELLANEOUS) ×4 IMPLANT
COVER BACK TABLE 24X17X13 BIG (DRAPES) ×2 IMPLANT
COVER TABLE BACK 60X90 (DRAPES) ×2 IMPLANT
DERMABOND ADVANCED (GAUZE/BANDAGES/DRESSINGS) ×3
DERMABOND ADVANCED .7 DNX12 (GAUZE/BANDAGES/DRESSINGS) ×3 IMPLANT
DRAPE C-ARM 42X72 X-RAY (DRAPES) ×4 IMPLANT
DRAPE LAPAROTOMY 100X72X124 (DRAPES) ×2 IMPLANT
DRAPE POUCH INSTRU U-SHP 10X18 (DRAPES) ×2 IMPLANT
DRAPE PROXIMA HALF (DRAPES) ×2 IMPLANT
DRSG EMULSION OIL 3X3 NADH (GAUZE/BANDAGES/DRESSINGS) IMPLANT
ELECT REM PT RETURN 9FT ADLT (ELECTROSURGICAL) ×2
ELECTRODE REM PT RTRN 9FT ADLT (ELECTROSURGICAL) ×1 IMPLANT
GAUZE SPONGE 4X4 16PLY XRAY LF (GAUZE/BANDAGES/DRESSINGS) ×2 IMPLANT
GLOVE BIOGEL PI IND STRL 7.0 (GLOVE) ×1 IMPLANT
GLOVE BIOGEL PI IND STRL 8 (GLOVE) ×3 IMPLANT
GLOVE BIOGEL PI INDICATOR 7.0 (GLOVE) ×1
GLOVE BIOGEL PI INDICATOR 8 (GLOVE) ×3
GLOVE ECLIPSE 7.5 STRL STRAW (GLOVE) ×12 IMPLANT
GLOVE EXAM NITRILE LRG STRL (GLOVE) IMPLANT
GLOVE EXAM NITRILE MD LF STRL (GLOVE) IMPLANT
GLOVE EXAM NITRILE XL STR (GLOVE) IMPLANT
GLOVE EXAM NITRILE XS STR PU (GLOVE) IMPLANT
GLOVE SURG SS PI 6.5 STRL IVOR (GLOVE) ×8 IMPLANT
GOWN BRE IMP SLV AUR LG STRL (GOWN DISPOSABLE) ×4 IMPLANT
GOWN BRE IMP SLV AUR XL STRL (GOWN DISPOSABLE) ×2 IMPLANT
GOWN STRL REIN 2XL LVL4 (GOWN DISPOSABLE) ×2 IMPLANT
KIT BASIN OR (CUSTOM PROCEDURE TRAY) ×2 IMPLANT
KIT INFUSE SMALL (Orthopedic Implant) ×2 IMPLANT
KIT ROOM TURNOVER OR (KITS) ×2 IMPLANT
MILL MEDIUM DISP (BLADE) ×4 IMPLANT
NEEDLE 18GX1X1/2 (RX/OR ONLY) (NEEDLE) ×2 IMPLANT
NEEDLE BONE MARROW 8GAX6 (NEEDLE) ×2 IMPLANT
NEEDLE SPNL 18GX3.5 QUINCKE PK (NEEDLE) ×2 IMPLANT
NEEDLE SPNL 22GX3.5 QUINCKE BK (NEEDLE) ×2 IMPLANT
NS IRRIG 1000ML POUR BTL (IV SOLUTION) ×4 IMPLANT
PACK LAMINECTOMY NEURO (CUSTOM PROCEDURE TRAY) ×2 IMPLANT
PAD ARMBOARD 7.5X6 YLW CONV (MISCELLANEOUS) ×6 IMPLANT
PATTIES SURGICAL .5 X.5 (GAUZE/BANDAGES/DRESSINGS) IMPLANT
PATTIES SURGICAL .5 X1 (DISPOSABLE) IMPLANT
PATTIES SURGICAL 1X1 (DISPOSABLE) IMPLANT
PEEK PLIF AVS 13X25X4 (Peek) ×8 IMPLANT
ROD 5.5X60MM GREEN (Rod) ×4 IMPLANT
SCREW 5.75 X 635 (Screw) ×4 IMPLANT
SCREW 5.75X40M (Screw) ×8 IMPLANT
SPONGE GAUZE 4X4 12PLY (GAUZE/BANDAGES/DRESSINGS) ×2 IMPLANT
SPONGE LAP 4X18 X RAY DECT (DISPOSABLE) ×2 IMPLANT
SPONGE NEURO XRAY DETECT 1X3 (DISPOSABLE) ×2 IMPLANT
SPONGE SURGIFOAM ABS GEL 100 (HEMOSTASIS) ×2 IMPLANT
STAPLER SKIN PROX WIDE 3.9 (STAPLE) IMPLANT
STRIP BIOACTIVE VITOSS 25X100X (Neuro Prosthesis/Implant) ×2 IMPLANT
STRIP CLOSURE SKIN 1/2X4 (GAUZE/BANDAGES/DRESSINGS) ×2 IMPLANT
SUT PROLENE 6 0 BV (SUTURE) IMPLANT
SUT VIC AB 1 CT1 18XBRD ANBCTR (SUTURE) ×2 IMPLANT
SUT VIC AB 1 CT1 8-18 (SUTURE) ×2
SUT VIC AB 2-0 CP2 18 (SUTURE) ×4 IMPLANT
SYR 20CC LL (SYRINGE) ×2 IMPLANT
SYR 5ML LL (SYRINGE) IMPLANT
SYR CONTROL 10ML LL (SYRINGE) ×2 IMPLANT
TAPE CLOTH SURG 4X10 WHT LF (GAUZE/BANDAGES/DRESSINGS) ×2 IMPLANT
TOWEL OR 17X24 6PK STRL BLUE (TOWEL DISPOSABLE) ×2 IMPLANT
TOWEL OR 17X26 10 PK STRL BLUE (TOWEL DISPOSABLE) ×2 IMPLANT
TRAP SPECIMEN MUCOUS 40CC (MISCELLANEOUS) IMPLANT
TRAY FOLEY CATH 14FRSI W/METER (CATHETERS) ×2 IMPLANT
WATER STERILE IRR 1000ML POUR (IV SOLUTION) ×2 IMPLANT

## 2012-04-16 NOTE — H&P (Signed)
Subjective: Patient is a 56 y.o. female who is admitted for treatment of advanced multilevel lumbar degenerative changes. Patient presents with left lumbar radicular pain, which injury with extensive nonsurgical management. Workup included x-rays that show advanced facet arthropathy bilaterally at L4-5 and L5-S1, and a static grade 1 spinal listhesis at L4-5. MRI scan shows the L4-5 grade 1 degenerative spinal listhesis, advanced facet arthropathy bilaterally at L4-5 with marked widening facets, corresponding to a spinal listhesis. At L5-S1 there is a broad-based disc protrusion with particular left L5-S1 facet arthropathy leading to significant left L5-S1 lateral recess neural foramina stenosis. Patient is admitted now for L4-S1 decompressive lumbar laminectomy, L4-5 and L5-S1 posterior lumbar interbody arthrodesis with interbody implants and bone graft, and a bilateral L4-S1 posterior lateral arthrodesis with posterior instrumentation and bone graft.   Patient Active Problem List   Diagnosis Date Noted  . Tobacco abuse 10/28/2011  . BASAL CELL CARCINOMA, HX OF 07/18/2009  . RENAL COLIC 07/05/2008  . OSTEOARTHRITIS 02/11/2008  . ADHD 02/10/2007  . VAGINITIS, ATROPHIC 01/05/2007  . HYPERLIPIDEMIA 12/15/2006  . HYPERTENSION 12/15/2006  . ALLERGIC RHINITIS 12/15/2006  . GERD 12/15/2006  . MENOPAUSAL SYNDROME 12/15/2006  . FIBROMYALGIA 12/15/2006   Past Medical History  Diagnosis Date  . ADHD 02/10/2007  . ALLERGIC RHINITIS 12/15/2006  . BASAL CELL CARCINOMA, HX OF 07/18/2009  . FIBROMYALGIA 12/15/2006  . GERD 12/15/2006  . HYPERLIPIDEMIA 12/15/2006  . HYPERTENSION 12/15/2006  . MENOPAUSAL SYNDROME 12/15/2006  . OSTEOARTHRITIS 02/11/2008  . Renal colic 07/05/2008  . VAGINITIS, ATROPHIC 01/05/2007  . Histoplasmosis with retinitis   . Heart murmur     earlier-  not heard now  . Anxiety   . Shortness of breath   . Cancer     left side of nose  . IBS (irritable bowel syndrome)   . Anal fissure     . Thoracic outlet syndrome   . Lipoma     on chest  . Fibromyalgia     Past Surgical History  Procedure Date  . Appendectomy   . Tonsillectomy   . Dilation and curettage of uterus   . Tonsillectomy   . Eye surgery     Lazer to right x 6    Prescriptions prior to admission  Medication Sig Dispense Refill  . ALPRAZolam (XANAX) 0.25 MG tablet Take 1 tablet (0.25 mg total) by mouth 2 (two) times daily as needed for sleep.  60 tablet  0  . aspirin 325 MG tablet Take 325 mg by mouth at bedtime.       Marland Kitchen b complex vitamins tablet Take 1 tablet by mouth at bedtime.      . budesonide-formoterol (SYMBICORT) 160-4.5 MCG/ACT inhaler Inhale 2 puffs into the lungs 2 (two) times daily.  3 Inhaler  3  . Calcium Citrate-Vitamin D (CITRACAL + D PO) Take 1 tablet by mouth 2 (two) times daily.      . chlorpheniramine (CHLOR-TRIMETON) 4 MG tablet Take 4 mg by mouth at bedtime.      . Coenzyme Q10 (COQ-10) 200 MG CAPS Take 1 tablet by mouth 2 (two) times daily.      . fexofenadine-pseudoephedrine (ALLEGRA-D) 60-120 MG per tablet Take 1 tablet by mouth daily as needed. For allergies      . furosemide (LASIX) 20 MG tablet Take 20 mg by mouth daily as needed. For swelling      . HYDROcodone-acetaminophen (NORCO) 10-325 MG per tablet Take 1 tablet by mouth 3 (three) times daily as needed.  for pain      . KRILL OIL PO Take 1 tablet by mouth every morning.      . Multiple Vitamin (MULTIVITAMIN WITH MINERALS) TABS Take 0.5 tablets by mouth 2 (two) times daily.      . rosuvastatin (CRESTOR) 10 MG tablet Take 1 tablet (10 mg total) by mouth at bedtime.  90 tablet  3  . triamterene-hydrochlorothiazide (MAXZIDE) 75-50 MG per tablet Take 0.5 tablets by mouth daily.      . vitamin C (ASCORBIC ACID) 500 MG tablet Take 500 mg by mouth 2 (two) times daily.       Allergies  Allergen Reactions  . Ivp Dye (Iodinated Diagnostic Agents) Hives    Fluroscan eye dye per patient  . Lyrica (Pregabalin) Swelling  . Chantix  (Varenicline Tartrate) Nausea And Vomiting    History  Substance Use Topics  . Smoking status: Current Every Day Smoker -- 1.0 packs/day for 30 years    Types: Cigarettes  . Smokeless tobacco: Never Used   Comment: using electronic cigarette as of 03/2012  . Alcohol Use: Yes     social    Family History  Problem Relation Age of Onset  . COPD Neg Hx     family  . Heart disease Neg Hx     family  . ADD / ADHD Neg Hx     family     Review of Systems A comprehensive review of systems was negative.  Objective: Vital signs in last 24 hours: Temp:  [98.3 F (36.8 C)] 98.3 F (36.8 C) (10/31 0625) Pulse Rate:  [86] 86  (10/31 0625) Resp:  [16] 16  (10/31 0625) BP: (101)/(58) 101/58 mmHg (10/31 0625) SpO2:  [97 %] 97 % (10/31 0625)  EXAM: Patient is a well-developed well-nourished white female in no acute distress. Lungs are clear to auscultation , the patient has symmetrical respiratory excursion. Heart has a regular rate and rhythm normal S1 and S2 no murmur.   Abdomen is soft nontender nondistended bowel sounds are present. Extremity examination shows no clubbing cyanosis or edema. Musculoskeletal examination shows mild tenderness to palpation the left paralumbar region, none over the right paralumbar region, and none over the lumbar spinous processes. The patient does have a palpable scoliosis. She is able to flex beyond 90, and able to extend to 10. Straight leg raising is negative bilaterally. Motor examination shows 5 over 5 strength in the lower extremities including the iliopsoas quadriceps dorsiflexor extensor hallicus  longus and plantar flexor bilaterally. Sensation is intact to pinprick in the distal lower extremities. Reflexes are symmetrical bilaterally. No pathologic reflexes are present. Patient has a normal gait and stance.   Data Review:CBC    Component Value Date/Time   WBC 11.1* 04/09/2012 1250   RBC 4.16 04/09/2012 1250   HGB 13.7 04/09/2012 1250   HCT 39.3  04/09/2012 1250   PLT 279 04/09/2012 1250   MCV 94.5 04/09/2012 1250   MCH 32.9 04/09/2012 1250   MCHC 34.9 04/09/2012 1250   RDW 13.9 04/09/2012 1250   LYMPHSABS 3.0 11/28/2010 1008   MONOABS 0.8 11/28/2010 1008   EOSABS 0.4 11/28/2010 1008   BASOSABS 0.1 11/28/2010 1008                          BMET    Component Value Date/Time   NA 142 04/09/2012 1250   K 3.7 04/09/2012 1250   CL 105 04/09/2012 1250   CO2 26  04/09/2012 1250   GLUCOSE 92 04/09/2012 1250   GLUCOSE 100* 05/26/2006 0916   BUN 11 04/09/2012 1250   CREATININE 0.87 04/09/2012 1250   CALCIUM 9.5 04/09/2012 1250   GFRNONAA 72* 04/09/2012 1250   GFRAA 83* 04/09/2012 1250     Assessment/Plan: Patient admitted for an L4-S1 decompression , PLIF, and PLA.  I've discussed with the patient the nature of his condition, the nature the surgical procedure, the typical length of surgery, hospital stay, and overall recuperation, the limitations postoperatively, and risks of surgery. I discussed risks including risks of infection, bleeding, possibly need for transfusion, the risk of nerve root dysfunction with pain, weakness, numbness, or paresthesias, the risk of dural tear and CSF leakage and possible need for further surgery, the risk of failure of the arthrodesis and possibly for further surgery, the risk of anesthetic complications including myocardial infarction, stroke, pneumonia, and death. We discussed the need for postoperative immobilization in a lumbar brace. Understanding all this the patient does wish to proceed with surgery and is admitted for such.     Hewitt Shorts, MD 04/16/2012 7:19 AM

## 2012-04-16 NOTE — Op Note (Signed)
04/16/2012  1:16 PM  PATIENT:  Catherine Craig  56 y.o. female  PRE-OPERATIVE DIAGNOSIS:  lumbar stenosis spondylolisthesis lumbar spondylosis  POST-OPERATIVE DIAGNOSIS:  lumbar stenosis spondylolisthesis lumbar spondylosis  PROCEDURE:  Procedure(s): POSTERIOR LUMBAR FUSION 2 LEVEL: L4-S1 decompressive lumbar laminectomy, bilateral facetectomy, bilateral foraminotomies, with decompression of the exiting L4, L5, and S1 nerve roots bilaterally, with decompression beyond that required for interbody arthrodesis, with microdissection microsurgical technique, bilateral L4-5 and L5-S1 posterior lumbar interbody arthrodesis with AVS peek interbody implants, Vitoss with bone marrow aspirate, infuse, and locally harvested morcellized autograft, and a bilateral L4-S1 posterior lateral arthrodesis with radius posterior instrumentation (segmental), Vitoss with bone marrow aspirate, infuse, and locally harvested morcellized autograft   SURGEON:  Surgeon(s): Hewitt Shorts, MD Clydene Fake, MD  ASSISTANTS:James Mickeal Skinner, M.D.   ANESTHESIA:   general  EBL:  Total I/O In: 3200 [I.V.:3200] Out: 565 [Urine:315; Blood:250]  BLOOD ADMINISTERED:none  CELL SAVER GIVEN:None     COUNT:  Correct per nursing staff   DICTATION: Patient was brought to the operating room placed under general endotracheal anesthesia. The patient was turned to prone position, the lumbar region was prepped with Betadine soap and solution and draped in a sterile fashion. The midline was infiltrated with local anesthesia with epinephrine. A midline incision was made and carried down through the subcutaneous tissue, bipolar cautery and electrocautery were used to maintain hemostasis. Dissection was carried down to the lumbar fascia. The fascia was incised bilaterally and the paraspinal muscles were dissected with a spinous process and lamina in a subperiosteal fashion. Another x-ray was taken for localization and the L4-5 and L5-S1  levels was localized. Dissection was then carried out laterally over the facet complexes and the transverse processes of L4 and L5 and the ala of S1 were exposed and decorticated. Decompression was performed using the double-action rongeurs, high-speed drill, and Kerrison punches. Inferior L4, complete L5, and superior S1 laminectomy was performed, along with bilateral L4-5 and L5-S1 facetectomy. Foraminotomies for decompression of the exiting L4, L5, and S1 nerve roots was performed. Dissection was carried out laterally including facetectomy and foraminotomies with decompression of the stenotic compression of the L4, L5, and S1 nerve roots. Once the decompression of the stenotic compression of the thecal sac and exiting nerve roots was completed we proceeded with the posterior lumbar interbody arthrodesis. The annulus at each level was incised bilaterally and the disc space entered. A thorough discectomy was performed using pituitary rongeurs and curettes. Once the discectomy was completed we began to prepare the endplate surfaces, removing the cartilaginous endplates surface with paddle curettes. We then measured the height of the intervertebral disc space. We selected 13 x 25 x 4 AVS peek interbody implants for the L4-5 level, and 13 x 25 x 4 AVS peek interbody implants for the L5-S1 level.  The C-arm fluoroscope was then draped and brought in the field and we identified the pedicle entry points bilaterally at the L4, L5, and S1 levels. Each of the 6 pedicles was probed, we aspirated bone marrow aspirate from the vertebral bodies, this was injected over a 10 cc strip of Vitoss BA. Then each of the pedicles was examined with the ball probe, good bony surfaces were found and no bony cuts were found. Each of the pedicles was then tapped with a 5.25 mm tap, again examined with the ball probe good threading was found and no bony cuts were found. We then placed 5.75 by 40 millimeter screws bilaterally at the L4  level, 5.75 by 40 millimeter screws bilaterally at the L5 level, and 5.75 by 35 millimeter screws bilaterally at the S1 level.  We then packed the AVS peek interbody implants with Vitoss BA with bone marrow aspirate, and then placed the first implant at the L4-5 level on the right side, carefully retracting the thecal sac and nerve root medially. We then went back to the left side and packed the midline with locally harvested morcellized autograft and then placed a second implant on the left side again retracting the thecal sac and nerve root medially. Then at the L5-S1 level, we placed the first implant on the right side, carefully retracting the thecal sac and nerve root medially. We then went back to the left side and packed the midline with locally harvested morcellized autograft and then placed a second implant on the left side, again retracting the thecal sac and nerve root medially.    We then packed the lateral gutter over the transverse processes and intertransverse space with Vitoss BA with bone marrow aspirate, infuse, and locally harvested morcellized autograft. We then selected 60 mm pre-lordosed rods, they were placed within the screw heads and secured with locking caps once all 6 locking caps were placed final tightening was performed against a counter torque.  The wound had been irrigated multiple times during the procedure with saline solution and bacitracin solution, good hemostasis was established with a combination of bipolar cautery and Gelfoam with thrombin. Once good hemostasis was confirmed we proceeded with closure paraspinal muscles deep fascia and Scarpa's fascia were closed with interrupted undyed 1 Vicryl sutures the subcutaneous and subcuticular closed with interrupted inverted 2-0 undyed Vicryl sutures the skin edges were approximated with Dermabond.  Following surgery the patient was turned back to the supine position to be reversed and the anesthetic extubated and transferred  to the recovery room for further care.   PATIENT DISPOSITION:  PACU - hemodynamically stable.   Delay start of Pharmacological VTE agent (>24hrs) due to surgical blood loss or risk of bleeding:  yes

## 2012-04-16 NOTE — Anesthesia Postprocedure Evaluation (Signed)
  Anesthesia Post-op Note  Patient: Catherine Craig  Procedure(s) Performed: Procedure(s) (LRB) with comments: POSTERIOR LUMBAR FUSION 2 LEVEL (N/A) - Lumbar four through sacral one laminectomy with Lumbar four-five, lumbar five-sacral one posterior lumbar interbody fusion with interbody prothesis posterolateral arthrodesis and posterior segmental instrumentation  Patient Location: PACU  Anesthesia Type:General  Level of Consciousness: awake, alert , oriented and patient cooperative  Airway and Oxygen Therapy: Patient Spontanous Breathing and Patient connected to nasal cannula oxygen  Post-op Pain: moderate  Post-op Assessment: Post-op Vital signs reviewed, Patient's Cardiovascular Status Stable, Respiratory Function Stable, Patent Airway, No signs of Nausea or vomiting and Pain level controlled  Post-op Vital Signs: stable  Complications: No apparent anesthesia complications

## 2012-04-16 NOTE — Anesthesia Preprocedure Evaluation (Addendum)
Anesthesia Evaluation  Patient identified by MRN, date of birth, ID band Patient awake    Reviewed: Allergy & Precautions, H&P , NPO status , Patient's Chart, lab work & pertinent test results  Airway Mallampati: I TM Distance: >3 FB Neck ROM: Full    Dental  (+) Upper Dentures and Lower Dentures   Pulmonary shortness of breath, Current Smoker,          Cardiovascular hypertension, + Valvular Problems/Murmurs Rhythm:regular Rate:Normal     Neuro/Psych PSYCHIATRIC DISORDERS Anxiety  Neuromuscular disease    GI/Hepatic GERD-  ,  Endo/Other    Renal/GU      Musculoskeletal  (+) Fibromyalgia -  Abdominal   Peds  Hematology   Anesthesia Other Findings   Reproductive/Obstetrics                          Anesthesia Physical Anesthesia Plan  ASA: II  Anesthesia Plan: General   Post-op Pain Management:    Induction: Intravenous  Airway Management Planned: Oral ETT  Additional Equipment:   Intra-op Plan:   Post-operative Plan: Extubation in OR  Informed Consent: I have reviewed the patients History and Physical, chart, labs and discussed the procedure including the risks, benefits and alternatives for the proposed anesthesia with the patient or authorized representative who has indicated his/her understanding and acceptance.     Plan Discussed with: CRNA, Anesthesiologist and Surgeon  Anesthesia Plan Comments:         Anesthesia Quick Evaluation

## 2012-04-16 NOTE — Progress Notes (Signed)
Filed Vitals:   04/16/12 1315 04/16/12 1330 04/16/12 1418 04/16/12 1653  BP:   99/73 115/72  Pulse: 87 87 80 91  Temp:   97.6 F (36.4 C) 97.5 F (36.4 C)  TempSrc:      Resp: 19 13 16 16   SpO2: 93% 94% 96% 92%    Patient resting comfortably in bed. Has had moderate incisional pain, but good relief of left lumbar radicular pain. She's been up and out of bed, has ambulated in the hall, and has set in chair. Dressing clean and dry. Moving all 4 extremities well.  Plan: We'll discontinue Foley, and have encouraged continued and gradually increasing ambulation in halls.  Hewitt Shorts, MD 04/16/2012, 6:26 PM

## 2012-04-16 NOTE — Transfer of Care (Signed)
Immediate Anesthesia Transfer of Care Note  Patient: Catherine Craig  Procedure(s) Performed: Procedure(s) (LRB) with comments: POSTERIOR LUMBAR FUSION 2 LEVEL (N/A) - Lumbar four through sacral one laminectomy with Lumbar four-five, lumbar five-sacral one posterior lumbar interbody fusion with interbody prothesis posterolateral arthrodesis and posterior segmental instrumentation  Patient Location: PACU  Anesthesia Type:General  Level of Consciousness: awake, alert  and oriented  Airway & Oxygen Therapy: Patient Spontanous Breathing and Patient connected to nasal cannula oxygen  Post-op Assessment: Report given to PACU RN, Post -op Vital signs reviewed and stable and Patient moving all extremities X 4  Post vital signs: Reviewed and stable  Complications: No apparent anesthesia complications

## 2012-04-17 NOTE — Progress Notes (Signed)
Pt has voided x 4 in the past 12 hours, PVR completed after each one.  1. Void=231mL - PVR=29mL  2. Voided - PVR=166mL  3. Voided - PVR=135mL  4. Void=300 - PVR  Will continue to monitor patient's output. Rema Fendt, RN

## 2012-04-17 NOTE — Progress Notes (Signed)
Filed Vitals:   04/16/12 2138 04/17/12 0028 04/17/12 0400 04/17/12 0757  BP:  90/68 137/96 105/73  Pulse:  87 89 85  Temp:  99.3 F (37.4 C) 97.9 F (36.6 C) 97.7 F (36.5 C)  TempSrc:      Resp:  20 20 18   SpO2: 96% 98% 95% 93%    Patient with moderate discomfort, contributed to by 10 years of use hydrocodone, taking 10 mg 4 times a day prior to admission. Being treated with acetaminophen IV, Toradol IV, OxyIR, and morphine IM, as well as having received one Flexeril. Has ambulate in the halls twice so far. Wound clean and dry, currently opened air.  Having difficulties with urinary retention, has required it out catheterization about 5 hours ago, continued to be monitored closely by nursing staff.  Plan: Encouraged to ambulate actively in halls. We'll continue current pain management. We'll try to transition to oral pain management regimen.  Hewitt Shorts, MD 04/17/2012, 8:02 AM

## 2012-04-18 MED ORDER — OXYCODONE-ACETAMINOPHEN 10-325 MG PO TABS: 1.0000 | ORAL_TABLET | ORAL | Status: DC | PRN

## 2012-04-18 NOTE — Discharge Summary (Signed)
Physician Discharge Summary  Patient ID: Catherine Craig MRN: [id number] DOB/AGE: May 28, 1956 56 y.o.  Admit date: 04/16/2012 Discharge date: 04/18/2012  Admission Diagnoses:lumbar stenosis spondylolisthesis lumbar spondylosis   Discharge Diagnoses: lumbar stenosis spondylolisthesis lumbar spondylosis  Active Problems:  * No active hospital problems. *    Discharged Condition: good  Hospital Course: Catherine Craig was brought to the operating room and underwent a lumbar fusion. She has post op had good progression. She is ambulating, voiding, and tolerating a regular diet. Her strength is full post op.    Consults: None  Significant Diagnostic Studies: none  Treatments: surgery: POSTERIOR LUMBAR FUSION 2 LEVEL: L4-S1 decompressive lumbar laminectomy, bilateral facetectomy, bilateral foraminotomies, with decompression of the exiting L4, L5, and S1 nerve roots bilaterally, with decompression beyond that required for interbody arthrodesis, with microdissection microsurgical technique, bilateral L4-5 and L5-S1 posterior lumbar interbody arthrodesis with AVS peek interbody implants, Vitoss with bone marrow aspirate, infuse, and locally harvested morcellized autograft, and a bilateral L4-S1 posterior lateral arthrodesis with radius posterior instrumentation (segmental), Vitoss with bone marrow aspirate, infuse, and locally harvested morcellized autograft    Discharge Exam: Blood pressure 99/70, pulse 99, temperature 99.2 F (37.3 C), temperature source Oral, resp. rate 18, SpO2 95.00%. General appearance: alert, cooperative and appears stated age. Normal neurologic exam. Wound is clean dry and without signs of infection.   Disposition: Final discharge disposition not confirmed  Discharge Orders    Future Appointments: Provider: Department: Dept Phone: Center:   06/01/2012 1:10 PM [facility] [phone] [facility]       Medication List     As of 04/18/2012  9:38  AM    TAKE these medications         ALPRAZolam 0.25 MG tablet   Commonly known as: XANAX   Take 1 tablet (0.25 mg total) by mouth 2 (two) times daily as needed for sleep.      aspirin 325 MG tablet   Take 325 mg by mouth at bedtime.      b complex vitamins tablet   Take 1 tablet by mouth at bedtime.      budesonide-formoterol 160-4.5 MCG/ACT inhaler   Commonly known as: SYMBICORT   Inhale 2 puffs into the lungs 2 (two) times daily.      chlorpheniramine 4 MG tablet   Commonly known as: CHLOR-TRIMETON   Take 4 mg by mouth at bedtime.      CITRACAL + D PO   Take 1 tablet by mouth 2 (two) times daily.      CoQ-10 200 MG Caps   Take 1 tablet by mouth 2 (two) times daily.      fexofenadine-pseudoephedrine 60-120 MG per tablet   Commonly known as: ALLEGRA-D   Take 1 tablet by mouth daily as needed. For allergies      furosemide 20 MG tablet   Commonly known as: LASIX   Take 20 mg by mouth daily as needed. For swelling      HYDROcodone-acetaminophen 10-325 MG per tablet   Commonly known as: NORCO   Take 1 tablet by mouth 3 (three) times daily as needed. for pain      KRILL OIL PO   Take 1 tablet by mouth every morning.      multivitamin with minerals Tabs   Take 0.5 tablets by mouth 2 (two) times daily.      oxyCODONE-acetaminophen 10-325 MG per tablet   Commonly known as: PERCOCET   Take 1 tablet by mouth every  4 (four) hours as needed for pain.      rosuvastatin 10 MG tablet   Commonly known as: CRESTOR   Take 1 tablet (10 mg total) by mouth at bedtime.      triamterene-hydrochlorothiazide 75-50 MG per tablet   Commonly known as: MAXZIDE   Take 0.5 tablets by mouth daily.      vitamin C 500 MG tablet   Commonly known as: ASCORBIC ACID   Take 500 mg by mouth 2 (two) times daily.           Follow-up Information    Follow up with Hewitt Shorts, MD. In 3 weeks. (call to make appt)    Contact information:   [address] [place]  [place] [address] [phone]          Signed: Kearra Calkin L 04/18/2012, 9:38 AM

## 2012-04-18 NOTE — Progress Notes (Signed)
Pt voided 3 times thus far on this shift (7p - 7a) as follows:  1. @2035  - Voided at least 100 (missed the hat); PVR was 357. 2. @0040  - Voided 200; PVR was 388. 3. @0420  - Voided 300; PVR was 198.  No complaint of genitourinary discomfort voiced.

## 2012-04-18 NOTE — Progress Notes (Signed)
Pt and daughter given D/C instructions with Rx, verbal understanding given. Pt D/C'd home with family via wheelchair @ 1010 per MD order. Rema Fendt, RN

## 2012-04-20 MED FILL — Sodium Chloride IV Soln 0.9%: INTRAVENOUS | Qty: 1000 | Status: AC

## 2012-04-20 MED FILL — Heparin Sodium (Porcine) Inj 1000 Unit/ML: INTRAMUSCULAR | Qty: 30 | Status: AC

## 2012-04-20 MED FILL — Sodium Chloride Irrigation Soln 0.9%: Qty: 3000 | Status: AC

## 2012-05-13 ENCOUNTER — Encounter: Payer: Self-pay | Admitting: Internal Medicine

## 2012-05-13 ENCOUNTER — Ambulatory Visit (INDEPENDENT_AMBULATORY_CARE_PROVIDER_SITE_OTHER): Payer: BC Managed Care – PPO | Admitting: Internal Medicine

## 2012-05-13 VITALS — BP 130/80 | HR 76 | Temp 97.5°F | Resp 18 | Wt 178.0 lb

## 2012-05-13 DIAGNOSIS — M1990 Unspecified osteoarthritis, unspecified site: Secondary | ICD-10-CM

## 2012-05-13 DIAGNOSIS — I10 Essential (primary) hypertension: Secondary | ICD-10-CM

## 2012-05-13 DIAGNOSIS — IMO0001 Reserved for inherently not codable concepts without codable children: Secondary | ICD-10-CM

## 2012-05-13 DIAGNOSIS — E785 Hyperlipidemia, unspecified: Secondary | ICD-10-CM

## 2012-05-13 DIAGNOSIS — F909 Attention-deficit hyperactivity disorder, unspecified type: Secondary | ICD-10-CM

## 2012-05-13 MED ORDER — AMPHETAMINE-DEXTROAMPHETAMINE 10 MG PO TABS: 10.0000 mg | ORAL_TABLET | Freq: Two times a day (BID) | ORAL | Status: DC

## 2012-05-13 MED ORDER — HYDROCODONE-ACETAMINOPHEN 10-325 MG PO TABS: 1.0000 | ORAL_TABLET | Freq: Three times a day (TID) | ORAL | Status: DC | PRN

## 2012-05-13 NOTE — Progress Notes (Signed)
Subjective:    Patient ID: Catherine Craig, female    DOB: February 22, 1956, 56 y.o.   MRN: [id number]  HPI  56 year old patient who is seen today for followup. Since her last visit here she has had a lumbar laminectomy do to the radiculopathy on the left. She has done quite well postop. She did see her neurosurgeon 5 days ago. She continues to wear a lumbar brace. She has a history of osteoarthritis and fibromyalgia and is requesting a refill on her pain medications. Postoperatively she was given tramadol only. She also is requesting a refill on soma-this was discouraged. She has hypertension and dyslipidemia. Blood pressure is well-controlled during the hospital stay.  She also has a history of ADHD and is requesting a refill on Adderall. In general doing well postoperatively.  Past Medical History  Diagnosis Date  . ADHD 02/10/2007  . ALLERGIC RHINITIS 12/15/2006  . BASAL CELL CARCINOMA, HX OF 07/18/2009  . FIBROMYALGIA 12/15/2006  . GERD 12/15/2006  . HYPERLIPIDEMIA 12/15/2006  . HYPERTENSION 12/15/2006  . MENOPAUSAL SYNDROME 12/15/2006  . OSTEOARTHRITIS 02/11/2008  . Renal colic 07/05/2008  . VAGINITIS, ATROPHIC 01/05/2007  . Histoplasmosis with retinitis   . Heart murmur     earlier-  not heard now  . Anxiety   . Shortness of breath   . Cancer     left side of nose  . IBS (irritable bowel syndrome)   . Anal fissure   . Thoracic outlet syndrome   . Lipoma     on chest  . Fibromyalgia     History   Social History  . Marital Status: Divorced    Spouse Name: N/A    Number of Children: N/A  . Years of Education: N/A   Occupational History  . Not on file.   Social History Main Topics  . Smoking status: Current Every Day Smoker -- 1.0 packs/day for 30 years    Types: Cigarettes  . Smokeless tobacco: Never Used     Comment: using electronic cigarette as of 03/2012  . Alcohol Use: Yes     Comment: social  . Drug Use: No  . Sexually Active: Not on file   Other Topics Concern  . Not  on file   Social History Narrative  . No narrative on file    Past Surgical History  Procedure Date  . Appendectomy   . Tonsillectomy   . Dilation and curettage of uterus   . Tonsillectomy   . Eye surgery     Lazer to right x 6  . Spine surgery 04/16/2012    Family History  Problem Relation Age of Onset  . COPD Neg Hx     family  . Heart disease Neg Hx     family  . ADD / ADHD Neg Hx     family    Allergies  Allergen Reactions  . Ivp Dye (Iodinated Diagnostic Agents) Hives    Fluroscan eye dye per patient  . Lyrica (Pregabalin) Swelling  . Chantix (Varenicline Tartrate) Nausea And Vomiting    Current Outpatient Prescriptions on File Prior to Visit  Medication Sig Dispense Refill  . aspirin 325 MG tablet Take 325 mg by mouth at bedtime.       Marland Kitchen b complex vitamins tablet Take 1 tablet by mouth at bedtime.      . budesonide-formoterol (SYMBICORT) 160-4.5 MCG/ACT inhaler Inhale 2 puffs into the lungs 2 (two) times daily.  3 Inhaler  3  . Calcium Citrate-Vitamin D (CITRACAL +  D PO) Take 1 tablet by mouth 2 (two) times daily.      . chlorpheniramine (CHLOR-TRIMETON) 4 MG tablet Take 4 mg by mouth at bedtime.      . Coenzyme Q10 (COQ-10) 200 MG CAPS Take 1 tablet by mouth 2 (two) times daily.      . fexofenadine-pseudoephedrine (ALLEGRA-D) 60-120 MG per tablet Take 1 tablet by mouth daily as needed. For allergies      . furosemide (LASIX) 20 MG tablet Take 20 mg by mouth daily as needed. For swelling      . HYDROcodone-acetaminophen (NORCO) 10-325 MG per tablet Take 1 tablet by mouth 3 (three) times daily as needed. for pain      . KRILL OIL PO Take 1 tablet by mouth every morning.      . Multiple Vitamin (MULTIVITAMIN WITH MINERALS) TABS Take 0.5 tablets by mouth 2 (two) times daily.      . rosuvastatin (CRESTOR) 10 MG tablet Take 1 tablet (10 mg total) by mouth at bedtime.  90 tablet  3  . triamterene-hydrochlorothiazide (MAXZIDE) 75-50 MG per tablet Take 0.5 tablets by  mouth daily.      . vitamin C (ASCORBIC ACID) 500 MG tablet Take 500 mg by mouth 2 (two) times daily.      Marland Kitchen ALPRAZolam (XANAX) 0.25 MG tablet Take 1 tablet (0.25 mg total) by mouth 2 (two) times daily as needed for sleep.  60 tablet  0  . oxyCODONE-acetaminophen (PERCOCET) 10-325 MG per tablet Take 1 tablet by mouth every 4 (four) hours as needed for pain.  70 tablet  0  . [DISCONTINUED] fluticasone (FLONASE) 50 MCG/ACT nasal spray Place 1 spray into the nose daily.  16 g  2  . [DISCONTINUED] pravastatin (PRAVACHOL) 40 MG tablet Take 1 tablet (40 mg total) by mouth every evening.  90 tablet  3    BP 130/80  Pulse 76  Temp 97.5 F (36.4 C) (Oral)  Resp 18  Wt 178 lb (80.74 kg)       Review of Systems  Constitutional: Negative.   HENT: Negative for hearing loss, congestion, sore throat, rhinorrhea, dental problem, sinus pressure and tinnitus.   Eyes: Negative for pain, discharge and visual disturbance.  Respiratory: Negative for cough and shortness of breath.   Cardiovascular: Negative for chest pain, palpitations and leg swelling.  Gastrointestinal: Negative for nausea, vomiting, abdominal pain, diarrhea, constipation, blood in stool and abdominal distention.  Genitourinary: Negative for dysuria, urgency, frequency, hematuria, flank pain, vaginal bleeding, vaginal discharge, difficulty urinating, vaginal pain and pelvic pain.  Musculoskeletal: Positive for back pain. Negative for joint swelling, arthralgias and gait problem.  Skin: Negative for rash.  Neurological: Negative for dizziness, syncope, speech difficulty, weakness, numbness and headaches.  Hematological: Negative for adenopathy.  Psychiatric/Behavioral: Negative for behavioral problems, dysphoric mood and agitation. The patient is not nervous/anxious.        Objective:   Physical Exam  Constitutional: She is oriented to person, place, and time. She appears well-developed and well-nourished.  HENT:  Head:  Normocephalic.  Right Ear: External ear normal.  Left Ear: External ear normal.  Mouth/Throat: Oropharynx is clear and moist.  Eyes: Conjunctivae normal and EOM are normal. Pupils are equal, round, and reactive to light.  Neck: Normal range of motion. Neck supple. No thyromegaly present.  Cardiovascular: Normal rate, regular rhythm, normal heart sounds and intact distal pulses.   Pulmonary/Chest: Effort normal and breath sounds normal.  Abdominal: Soft. Bowel sounds are normal. She  exhibits no mass. There is no tenderness.  Musculoskeletal: Normal range of motion.  Lymphadenopathy:    She has no cervical adenopathy.  Neurological: She is alert and oriented to person, place, and time.  Skin: Skin is warm and dry. No rash noted.       Lumbar laminectomy scar nicely healed  Psychiatric: She has a normal mood and affect. Her behavior is normal.          Assessment & Plan:   Low back pain status post laminectomy Hypertension stable ADHD. Will renew Adderall Dyslipidemia. Continue Crestor   Recheck 6 months

## 2012-06-01 ENCOUNTER — Ambulatory Visit
Admission: RE | Admit: 2012-06-01 | Discharge: 2012-06-01 | Disposition: A | Discharge status: Home / Self Care | Payer: BC Managed Care – PPO | Source: Ambulatory Visit | Attending: Internal Medicine | Admitting: Internal Medicine

## 2012-06-01 DIAGNOSIS — Z1231 Encounter for screening mammogram for malignant neoplasm of breast: Secondary | ICD-10-CM

## 2012-07-01 ENCOUNTER — Encounter: Payer: Self-pay | Admitting: Physical Medicine & Rehabilitation

## 2012-07-06 ENCOUNTER — Other Ambulatory Visit: Payer: Self-pay | Admitting: Internal Medicine

## 2012-07-06 NOTE — Telephone Encounter (Signed)
ok 

## 2012-07-08 NOTE — Telephone Encounter (Signed)
Pt following up on refill request for her HYDROcodone-acetaminophen (NORCO) 10-325 MG per tablet.

## 2012-07-08 NOTE — Telephone Encounter (Signed)
Spoke to pt told her Rx refill for Hydrocodone was called into pharmacy. Pt verbalized understanding and stated she has set up an appt with pain management that Dr. Amador Cunas. Recommended. Told pt good.

## 2012-07-20 ENCOUNTER — Encounter: Payer: Self-pay | Admitting: Physical Medicine & Rehabilitation

## 2012-07-20 ENCOUNTER — Ambulatory Visit (HOSPITAL_BASED_OUTPATIENT_CLINIC_OR_DEPARTMENT_OTHER): Payer: BC Managed Care – PPO | Admitting: Physical Medicine & Rehabilitation

## 2012-07-20 ENCOUNTER — Encounter: Payer: BC Managed Care – PPO | Attending: Physical Medicine & Rehabilitation

## 2012-07-20 VITALS — BP 165/115 | HR 102 | Resp 14 | Ht 63.0 in | Wt 174.0 lb

## 2012-07-20 DIAGNOSIS — M7061 Trochanteric bursitis, right hip: Secondary | ICD-10-CM

## 2012-07-20 DIAGNOSIS — K219 Gastro-esophageal reflux disease without esophagitis: Secondary | ICD-10-CM | POA: Insufficient documentation

## 2012-07-20 DIAGNOSIS — M961 Postlaminectomy syndrome, not elsewhere classified: Secondary | ICD-10-CM | POA: Insufficient documentation

## 2012-07-20 DIAGNOSIS — E785 Hyperlipidemia, unspecified: Secondary | ICD-10-CM | POA: Insufficient documentation

## 2012-07-20 DIAGNOSIS — Z981 Arthrodesis status: Secondary | ICD-10-CM | POA: Insufficient documentation

## 2012-07-20 DIAGNOSIS — M76899 Other specified enthesopathies of unspecified lower limb, excluding foot: Secondary | ICD-10-CM | POA: Insufficient documentation

## 2012-07-20 DIAGNOSIS — Z5181 Encounter for therapeutic drug level monitoring: Secondary | ICD-10-CM

## 2012-07-20 DIAGNOSIS — I10 Essential (primary) hypertension: Secondary | ICD-10-CM | POA: Insufficient documentation

## 2012-07-20 DIAGNOSIS — M545 Low back pain, unspecified: Secondary | ICD-10-CM | POA: Insufficient documentation

## 2012-07-20 DIAGNOSIS — R5381 Other malaise: Secondary | ICD-10-CM | POA: Insufficient documentation

## 2012-07-20 DIAGNOSIS — M79609 Pain in unspecified limb: Secondary | ICD-10-CM | POA: Insufficient documentation

## 2012-07-20 DIAGNOSIS — IMO0001 Reserved for inherently not codable concepts without codable children: Secondary | ICD-10-CM | POA: Insufficient documentation

## 2012-07-20 DIAGNOSIS — M549 Dorsalgia, unspecified: Secondary | ICD-10-CM

## 2012-07-20 MED ORDER — TRAMADOL HCL 50 MG PO TABS: 50.0000 mg | ORAL_TABLET | Freq: Four times a day (QID) | ORAL | Status: DC | PRN

## 2012-07-20 NOTE — Progress Notes (Addendum)
Subjective:    Patient ID: Catherine Craig, female    DOB: 08-19-1955, 57 y.o.   MRN: [id number]  HPI Low back pain and left leg pain.  Underwent physical therapy and chiropractic preoperatively. Underwent left L5 transforaminal lumbar epidural injections preoperatively. Had progression of left leg pain and weakness. Underwent neurosurgical evaluation in September of 2013. Underwent L4-5 and L5-S1 decompression and fusion 04/16/2012. Had good postoperative healing. Has had followup appointments with neurosurgery and x-rays demonstrating good placement of hardware. Continues to have low back pain and this is above the level of the fusion. Left leg pain has improved since the fusion and decompression. Other pain related conditions include chronic carpal tunnel syndrome diagnosed with EMG and nerve conduction. Also with chronic neck pain, no surgery or injections for this, no formal physical therapy for this. Right shoulder pain chronic which has responded to injections, no surgery  Very athletic in her younger days, coached sports until age 47. Takes 2 aleve in am and 3 advil TID Pain low back at night sleeps on stomach  Tried Lyrica in the past for her fibromyalgia syndrome. Does cause swelling and excessive drowsiness. Pain Inventory Average Pain 4 Pain Right Now 5 My pain is constant, dull, aching and muscle spasm, numbness  In the last 24 hours, has pain interfered with the following? General activity 4 Relation with others 4 Enjoyment of life 4 What TIME of day is your pain at its worst? morning and night Sleep (in general) Poor  Pain is worse with: bending and some activites Pain improves with: rest and medication Relief from Meds: 7  Mobility walk without assistance how many minutes can you walk? 25 ability to climb steps?  yes do you drive?  yes Do you have any goals in this area?  yes  Function disabled: date disabled 10/2011 I need assistance with the following:   bathing, household duties and shopping Do you have any goals in this area?  yes  Neuro/Psych bowel control problems weakness numbness tingling spasms depression  Prior Studies x-rays CT/MRI nerve study  Physicians involved in your care Primary care  Neurosurgeon  Orthopedist  Dr Eleonore Chiquito, Dr Shirlean Kelly, Dr Shelle Iron   Family History  Problem Relation Age of Onset  . COPD Neg Hx     family  . Heart disease Neg Hx     family  . ADD / ADHD Neg Hx     family   History   Social History  . Marital Status: Divorced    Spouse Name: N/A    Number of Children: N/A  . Years of Education: N/A   Social History Main Topics  . Smoking status: Current Every Day Smoker -- 1.0 packs/day for 30 years    Types: Cigarettes  . Smokeless tobacco: Never Used     Comment: using electronic cigarette as of 03/2012  . Alcohol Use: Yes     Comment: social  . Drug Use: No  . Sexually Active: None   Other Topics Concern  . None   Social History Narrative  . None   Past Surgical History  Procedure Date  . Appendectomy   . Tonsillectomy   . Dilation and curettage of uterus   . Tonsillectomy   . Eye surgery     Lazer to right x 6  . Spine surgery 04/16/2012   Past Medical History  Diagnosis Date  . ADHD 02/10/2007  . ALLERGIC RHINITIS 12/15/2006  . BASAL CELL CARCINOMA, HX OF 07/18/2009  .  FIBROMYALGIA 12/15/2006  . GERD 12/15/2006  . HYPERLIPIDEMIA 12/15/2006  . HYPERTENSION 12/15/2006  . MENOPAUSAL SYNDROME 12/15/2006  . OSTEOARTHRITIS 02/11/2008  . Renal colic 07/05/2008  . VAGINITIS, ATROPHIC 01/05/2007  . Histoplasmosis with retinitis   . Heart murmur     earlier-  not heard now  . Anxiety   . Shortness of breath   . Cancer     left side of nose  . IBS (irritable bowel syndrome)   . Anal fissure   . Thoracic outlet syndrome   . Lipoma     on chest  . Fibromyalgia    BP 165/115  Pulse 102  Resp 14  Ht 5\' 3"  (1.6 m)  Wt 174 lb (78.926 kg)  BMI 30.82  kg/m2  SpO2 100%     Review of Systems  Constitutional: Positive for unexpected weight change.  Respiratory: Positive for cough.   Gastrointestinal: Positive for constipation.  Musculoskeletal: Positive for back pain.  Neurological: Positive for weakness and numbness.  Psychiatric/Behavioral: Positive for dysphoric mood.  All other systems reviewed and are negative.       Objective:   Physical Exam  Constitutional: She is oriented to person, place, and time.  Musculoskeletal:       Right hip: She exhibits tenderness.       Left hip: She exhibits tenderness.       Right knee: Normal.       Left knee: Normal.       Lumbar back: She exhibits decreased range of motion, tenderness and pain.       Pain in lumbar paraspinals above the level of fusion Evidence of The thoracolumbar scoliosis convex right mild to moderate Tenderness over the left PSIS Tenderness over the right greater than left hip trochanteric bursa area  Neurological: She is alert and oriented to person, place, and time. She displays no tremor. She exhibits abnormal muscle tone.  Reflex Scores:      Tricep reflexes are 2+ on the right side and 2+ on the left side.      Bicep reflexes are 2+ on the right side and 2+ on the left side.      Brachioradialis reflexes are 2+ on the right side and 2+ on the left side.      Patellar reflexes are 2+ on the right side and 2+ on the left side.      Achilles reflexes are 1+ on the right side and 1+ on the left side.      Motor strength normal with the exception of left EHL weakness at 3+/5      Assessment & Plan:  1.Lumbar postlaminectomy syndrome. L4-5 and L5-S1 decompression and fusion was 3 months ago. Leg painImproved. Residual left EHL weakness. Residual pain in the upper lumbar area. Also pain in the left SI joint area 2. Right greater than left hip trochanteric bursitis we'll inject the right hip today 3. Fibromyalgia syndrome,Recommend restarting gabapentin 1 tablet  twice a day. She has either 100 mg or 300 mg tablets at home. She will bring these in next visit to review. 4. Deconditioning referral made to aquatic therapy to instruct home exercise program. Patient does have access to a pool  Right trochanteric bursa injection Indication right trochanteric bursitis pain persists despite conservative care Informed consent was obtained after describing risks and benefits of the procedure she elects to proceed and has given written consent. Patient placed in a left lateral decubitus position right greater trochanter area was marked and  prepped with iodine and alcohol then entered with a 25-gauge 1.5 inch needle. 1 cc of 40 mg Depo-Medrol +4 cc 1% lidocaine injected patient tolerated procedure well

## 2012-07-20 NOTE — Patient Instructions (Signed)
Referral to breakthrough therapy instruct aquatic exercises Trial of tramadol 50 mg one or one and a half tablets 3 times per day Start gabapentin or Neurontin 1 tablet twice a day We will check urine drug screen

## 2012-08-11 ENCOUNTER — Ambulatory Visit: Payer: BC Managed Care – PPO | Admitting: Physical Medicine & Rehabilitation

## 2012-08-25 ENCOUNTER — Ambulatory Visit: Payer: BC Managed Care – PPO | Admitting: Physical Medicine & Rehabilitation

## 2012-09-09 ENCOUNTER — Ambulatory Visit (INDEPENDENT_AMBULATORY_CARE_PROVIDER_SITE_OTHER): Payer: BC Managed Care – PPO | Admitting: Internal Medicine

## 2012-09-09 ENCOUNTER — Encounter: Payer: Self-pay | Admitting: Internal Medicine

## 2012-09-09 VITALS — BP 118/70 | HR 90 | Temp 98.0°F | Resp 18 | Wt 176.0 lb

## 2012-09-09 DIAGNOSIS — Z720 Tobacco use: Secondary | ICD-10-CM

## 2012-09-09 DIAGNOSIS — F17200 Nicotine dependence, unspecified, uncomplicated: Secondary | ICD-10-CM

## 2012-09-09 DIAGNOSIS — IMO0001 Reserved for inherently not codable concepts without codable children: Secondary | ICD-10-CM

## 2012-09-09 DIAGNOSIS — I10 Essential (primary) hypertension: Secondary | ICD-10-CM

## 2012-09-09 DIAGNOSIS — M1990 Unspecified osteoarthritis, unspecified site: Secondary | ICD-10-CM

## 2012-09-09 DIAGNOSIS — F909 Attention-deficit hyperactivity disorder, unspecified type: Secondary | ICD-10-CM

## 2012-09-09 DIAGNOSIS — E785 Hyperlipidemia, unspecified: Secondary | ICD-10-CM

## 2012-09-09 MED ORDER — HYDROCODONE-ACETAMINOPHEN 10-325 MG PO TABS: 1.0000 | ORAL_TABLET | Freq: Four times a day (QID) | ORAL | Status: DC | PRN

## 2012-09-09 MED ORDER — AMPHETAMINE-DEXTROAMPHETAMINE 10 MG PO TABS: 10.0000 mg | ORAL_TABLET | Freq: Two times a day (BID) | ORAL | Status: DC

## 2012-09-09 NOTE — Progress Notes (Signed)
Subjective:    Patient ID: Catherine Craig, female    DOB: [birth year], 57 y.o.   MRN: [id number]  HPI  57 year old patient who has treated hypertension. This has been well-controlled on diuretic therapy. She has ADHD and requires a refill on Adderall. She has done quite well on this regimen. She has dyslipidemia and has been treated with Crestor 10 mg daily she is requesting samples and a new prescription. She has a history of chronic low back pain and is status post laminectomy and fusion in late October of last year. She has had a nice benefit with resolution of leg pain. She continues to have back pain as well as fibromyalgia. She is requesting a refill on her analgesics. She has ongoing tobacco use  Denies any cardiopulmonary complaints  Past Medical History  Diagnosis Date  . ADHD 02/10/2007  . ALLERGIC RHINITIS 12/15/2006  . BASAL CELL CARCINOMA, HX OF 07/18/2009  . FIBROMYALGIA 12/15/2006  . GERD 12/15/2006  . HYPERLIPIDEMIA 12/15/2006  . HYPERTENSION 12/15/2006  . MENOPAUSAL SYNDROME 12/15/2006  . OSTEOARTHRITIS 02/11/2008  . Renal colic 07/05/2008  . VAGINITIS, ATROPHIC 01/05/2007  . Histoplasmosis with retinitis   . Heart murmur     earlier-  not heard now  . Anxiety   . Shortness of breath   . Cancer     left side of nose  . IBS (irritable bowel syndrome)   . Anal fissure   . Thoracic outlet syndrome   . Lipoma     on chest  . Fibromyalgia     History   Social History  . Marital Status: Divorced    Spouse Name: N/A    Number of Children: N/A  . Years of Education: N/A   Occupational History  . Not on file.   Social History Main Topics  . Smoking status: Current Every Day Smoker -- 1.00 packs/day for 30 years    Types: Cigarettes  . Smokeless tobacco: Never Used     Comment: using electronic cigarette as of 03/2012  . Alcohol Use: Yes     Comment: social  . Drug Use: No  . Sexually Active: Not on file   Other Topics Concern  . Not on file   Social History  Narrative  . No narrative on file    Past Surgical History  Procedure Laterality Date  . Appendectomy    . Tonsillectomy    . Dilation and curettage of uterus    . Tonsillectomy    . Eye surgery      Lazer to right x 6  . Spine surgery  04/16/2012    Family History  Problem Relation Age of Onset  . COPD Neg Hx     family  . Heart disease Neg Hx     family  . ADD / ADHD Neg Hx     family    Allergies  Allergen Reactions  . Ivp Dye (Iodinated Diagnostic Agents) Hives    Fluroscan eye dye per patient  . Lyrica (Pregabalin) Swelling  . Chantix (Varenicline Tartrate) Nausea And Vomiting    Current Outpatient Prescriptions on File Prior to Visit  Medication Sig Dispense Refill  . aspirin 325 MG tablet Take 325 mg by mouth at bedtime.       Marland Kitchen b complex vitamins tablet Take 1 tablet by mouth at bedtime.      . Calcium Citrate-Vitamin D (CITRACAL + D PO) Take 1 tablet by mouth 2 (two) times daily.      Marland Kitchen  chlorpheniramine (CHLOR-TRIMETON) 4 MG tablet Take 4 mg by mouth at bedtime.      . Coenzyme Q10 (COQ-10) 200 MG CAPS Take 1 tablet by mouth 2 (two) times daily.      . fexofenadine-pseudoephedrine (ALLEGRA-D) 60-120 MG per tablet Take 1 tablet by mouth daily as needed. For allergies      . furosemide (LASIX) 20 MG tablet Take 20 mg by mouth daily as needed. For swelling      . KRILL OIL PO Take 1 tablet by mouth every morning.      . Multiple Vitamin (MULTIVITAMIN WITH MINERALS) TABS Take 0.5 tablets by mouth 2 (two) times daily.      . naproxen sodium (ANAPROX) 220 MG tablet Take 220 mg by mouth 2 (two) times daily with a meal.      . traMADol (ULTRAM) 50 MG tablet Take 1 tablet (50 mg total) by mouth every 6 (six) hours as needed for pain.  120 tablet  1  . triamterene-hydrochlorothiazide (MAXZIDE) 75-50 MG per tablet Take 0.5 tablets by mouth daily.      . vitamin C (ASCORBIC ACID) 500 MG tablet Take 500 mg by mouth 2 (two) times daily.      . rosuvastatin (CRESTOR) 10 MG  tablet Take 1 tablet (10 mg total) by mouth at bedtime.  90 tablet  3  . [DISCONTINUED] fluticasone (FLONASE) 50 MCG/ACT nasal spray Place 1 spray into the nose daily.  16 g  2  . [DISCONTINUED] pravastatin (PRAVACHOL) 40 MG tablet Take 1 tablet (40 mg total) by mouth every evening.  90 tablet  3   No current facility-administered medications on file prior to visit.    BP 118/70  Pulse 90  Temp(Src) 98 F (36.7 C) (Oral)  Resp 18  Wt 176 lb (79.833 kg)  BMI 31.18 kg/m2  SpO2 97%       Review of Systems  Constitutional: Negative.   HENT: Negative for hearing loss, congestion, sore throat, rhinorrhea, dental problem, sinus pressure and tinnitus.   Eyes: Negative for pain, discharge and visual disturbance.  Respiratory: Negative for cough and shortness of breath.   Cardiovascular: Negative for chest pain, palpitations and leg swelling.  Gastrointestinal: Negative for nausea, vomiting, abdominal pain, diarrhea, constipation, blood in stool and abdominal distention.  Genitourinary: Negative for dysuria, urgency, frequency, hematuria, flank pain, vaginal bleeding, vaginal discharge, difficulty urinating, vaginal pain and pelvic pain.  Musculoskeletal: Positive for myalgias, back pain and arthralgias. Negative for joint swelling and gait problem.  Skin: Negative for rash.  Neurological: Negative for dizziness, syncope, speech difficulty, weakness, numbness and headaches.  Hematological: Negative for adenopathy.  Psychiatric/Behavioral: Negative for behavioral problems, dysphoric mood and agitation. The patient is not nervous/anxious.        Objective:   Physical Exam  Constitutional: She is oriented to person, place, and time. She appears well-developed and well-nourished.  HENT:  Head: Normocephalic.  Right Ear: External ear normal.  Left Ear: External ear normal.  Mouth/Throat: Oropharynx is clear and moist.  Eyes: Conjunctivae and EOM are normal. Pupils are equal, round, and  reactive to light.  Neck: Normal range of motion. Neck supple. No thyromegaly present.  Cardiovascular: Normal rate, regular rhythm, normal heart sounds and intact distal pulses.   Pulmonary/Chest: Effort normal and breath sounds normal.  Abdominal: Soft. Bowel sounds are normal. She exhibits no mass. There is no tenderness.  Musculoskeletal: Normal range of motion.  Lymphadenopathy:    She has no cervical adenopathy.  Neurological:  She is alert and oriented to person, place, and time.  Skin: Skin is warm and dry. No rash noted.  Psychiatric: She has a normal mood and affect. Her behavior is normal.          Assessment & Plan:   Hypertension well controlled ADHD stable. Adderall refilled Dyslipidemia. Continue Crestor samples provided Chronic back pain/fibromyalgia. Analgesic refilled  Recheck 3-6 months Followup rehabilitation

## 2012-09-09 NOTE — Patient Instructions (Addendum)
Limit your sodium (Salt) intake    It is important that you exercise regularly, at least 20 minutes 3 to 4 times per week.  If you develop chest pain or shortness of breath seek  medical attention.  Please check your blood pressure on a regular basis.  If it is consistently greater than 150/90, please make an office appointment.  

## 2012-10-05 ENCOUNTER — Telehealth: Payer: Self-pay | Admitting: Physical Medicine & Rehabilitation

## 2012-10-05 NOTE — Telephone Encounter (Signed)
FYI for Dr. Wynn Banker: Patient is really unhappy about the amount of her charges for injection/office at her new patient evaluation.  She is contesting the charges with the billing department and will not be returning.  She wanted Dr. Wynn Banker to be aware.

## 2012-10-06 ENCOUNTER — Telehealth: Payer: Self-pay | Admitting: Internal Medicine

## 2012-10-06 NOTE — Telephone Encounter (Signed)
Patient Information:  [name] Name: Catherine Craig  Phone: [phone]  Patient: Catherine Craig, Catherine Craig  Gender: Female  DOB: 13-Apr-1956  Age: 57 Years  PCP: Eleonore Chiquito [facility])  Office Follow Up:  Does the office need to follow up with this patient?: No  Instructions For The Office: N/A  RN Note:  3 week history of cough and sinus pain.  States sinus pain has not improved over past week.  Afebrile.  States sinus drainage > 10 days.  Per sinus pain protocol, emergent symptoms denied; advised appt within 24 hours.  Appt scheduled 10/06/12 1100 with Dr. Selena Batten.  krs/can  Symptoms  Reason For Call & Symptoms: cough and sinus pain  Reviewed Health History In EMR: Yes  Reviewed Medications In EMR: Yes  Reviewed Allergies In EMR: Yes  Reviewed Surgeries / Procedures: Yes  Date of Onset of Symptoms: 09/15/2012  Guideline(s) Used:  Sinus Pain and Congestion  Disposition Per Guideline:   See Today or Tomorrow in Office  Reason For Disposition Reached:   Sinus congestion (pressure, fullness) present > 10 days  Advice Given:  N/A  Patient Will Follow Care Advice:  YES  Appointment Scheduled:  10/07/2012 11:00:00 Appointment Scheduled Provider:  Kriste Basque (Family Practice)

## 2012-10-07 ENCOUNTER — Encounter: Payer: Self-pay | Admitting: Family Medicine

## 2012-10-07 ENCOUNTER — Ambulatory Visit (INDEPENDENT_AMBULATORY_CARE_PROVIDER_SITE_OTHER): Payer: BC Managed Care – PPO | Admitting: Family Medicine

## 2012-10-07 VITALS — HR 97 | Temp 98.1°F | Wt 176.0 lb

## 2012-10-07 DIAGNOSIS — J329 Chronic sinusitis, unspecified: Secondary | ICD-10-CM

## 2012-10-07 MED ORDER — AMOXICILLIN 875 MG PO TABS: 875.0000 mg | ORAL_TABLET | Freq: Two times a day (BID) | ORAL | Status: DC

## 2012-10-07 NOTE — Progress Notes (Signed)
Chief Complaint  Patient presents with  . sinus pain and pressure    low grade fever, cough, mucus brown, headache, sore throatx 3 weeks     HPI: Acute visit for sinus congestion: -started: 2 weeks ago -symptoms:nasal congestion, sore throat, cough, green nasal discharge, has some tooth pain -denies:fever, SOB, NVD -has tried:  Has been using nasal saline, used to use claritin -did strep test at [facility] and this was negative -sick contacts: yes -Hx of: allergies, smoker   ROS: See pertinent positives and negatives per HPI.  Past Medical History  Diagnosis Date  . ADHD 02/10/2007  . ALLERGIC RHINITIS 12/15/2006  . BASAL CELL CARCINOMA, HX OF 07/18/2009  . FIBROMYALGIA 12/15/2006  . GERD 12/15/2006  . HYPERLIPIDEMIA 12/15/2006  . HYPERTENSION 12/15/2006  . MENOPAUSAL SYNDROME 12/15/2006  . OSTEOARTHRITIS 02/11/2008  . Renal colic 07/05/2008  . VAGINITIS, ATROPHIC 01/05/2007  . Histoplasmosis with retinitis   . Heart murmur     earlier-  not heard now  . Anxiety   . Shortness of breath   . Cancer     left side of nose  . IBS (irritable bowel syndrome)   . Anal fissure   . Thoracic outlet syndrome   . Lipoma     on chest  . Fibromyalgia     Family History  Problem Relation Age of Onset  . COPD Neg Hx     family  . Heart disease Neg Hx     family  . ADD / ADHD Neg Hx     family    History   Social History  . Marital Status: Divorced    Spouse Name: N/A    Number of Children: N/A  . Years of Education: N/A   Social History Main Topics  . Smoking status: Current Every Day Smoker -- 1.00 packs/day for 30 years    Types: Cigarettes  . Smokeless tobacco: Never Used     Comment: using electronic cigarette as of 03/2012  . Alcohol Use: Yes     Comment: social  . Drug Use: No  . Sexually Active: None   Other Topics Concern  . None   Social History Narrative  . None    Current outpatient prescriptions:amphetamine-dextroamphetamine (ADDERALL) 10 MG tablet, Take 1  tablet (10 mg total) by mouth 2 (two) times daily., Disp: 60 tablet, Rfl: 0;  amphetamine-dextroamphetamine (ADDERALL) 10 MG tablet, Take 1 tablet (10 mg total) by mouth 2 (two) times daily., Disp: 60 tablet, Rfl: 0;  amphetamine-dextroamphetamine (ADDERALL) 10 MG tablet, Take 1 tablet (10 mg total) by mouth 2 (two) times daily., Disp: 60 tablet, Rfl: 0 aspirin 325 MG tablet, Take 325 mg by mouth at bedtime. , Disp: , Rfl: ;  b complex vitamins tablet, Take 1 tablet by mouth at bedtime., Disp: , Rfl: ;  Calcium Citrate-Vitamin D (CITRACAL + D PO), Take 1 tablet by mouth 2 (two) times daily., Disp: , Rfl: ;  chlorpheniramine (CHLOR-TRIMETON) 4 MG tablet, Take 4 mg by mouth at bedtime., Disp: , Rfl: ;  Coenzyme Q10 (COQ-10) 200 MG CAPS, Take 1 tablet by mouth 2 (two) times daily., Disp: , Rfl:  fexofenadine-pseudoephedrine (ALLEGRA-D) 60-120 MG per tablet, Take 1 tablet by mouth daily as needed. For allergies, Disp: , Rfl: ;  furosemide (LASIX) 20 MG tablet, Take 20 mg by mouth daily as needed. For swelling, Disp: , Rfl: ;  HYDROcodone-acetaminophen (NORCO) 10-325 MG per tablet, Take 1 tablet by mouth every 6 (six) hours as needed.,  Disp: 120 tablet, Rfl: 2;  KRILL OIL PO, Take 1 tablet by mouth every morning., Disp: , Rfl:  Multiple Vitamin (MULTIVITAMIN WITH MINERALS) TABS, Take 0.5 tablets by mouth 2 (two) times daily., Disp: , Rfl: ;  naproxen sodium (ANAPROX) 220 MG tablet, Take 220 mg by mouth 2 (two) times daily with a meal., Disp: , Rfl: ;  traMADol (ULTRAM) 50 MG tablet, Take 1 tablet (50 mg total) by mouth every 6 (six) hours as needed for pain., Disp: 120 tablet, Rfl: 1 triamterene-hydrochlorothiazide (MAXZIDE) 75-50 MG per tablet, Take 0.5 tablets by mouth daily., Disp: , Rfl: ;  vitamin C (ASCORBIC ACID) 500 MG tablet, Take 500 mg by mouth 2 (two) times daily., Disp: , Rfl: ;  amoxicillin (AMOXIL) 875 MG tablet, Take 1 tablet (875 mg total) by mouth 2 (two) times daily., Disp: 20 tablet, Rfl: 0;   BROMFED DM 30-2-10 MG/5ML syrup, , Disp: , Rfl:  rosuvastatin (CRESTOR) 10 MG tablet, Take 1 tablet (10 mg total) by mouth at bedtime., Disp: 90 tablet, Rfl: 3;  [DISCONTINUED] fluticasone (FLONASE) 50 MCG/ACT nasal spray, Place 1 spray into the nose daily., Disp: 16 g, Rfl: 2;  [DISCONTINUED] pravastatin (PRAVACHOL) 40 MG tablet, Take 1 tablet (40 mg total) by mouth every evening., Disp: 90 tablet, Rfl: 3  EXAM:  Filed Vitals:   10/07/12 1105  Pulse: 97  Temp: 98.1 F (36.7 C)    Body mass index is 31.18 kg/(m^2).  GENERAL: vitals reviewed and listed above, alert, oriented, appears well hydrated and in no acute distress  HEENT: atraumatic, conjunttiva clear, no obvious abnormalities on inspection of external nose and ears, normal appearance of ear canals and TMs, clear nasal congestion, mild post oropharyngeal erythema with PND, no tonsillar edema or exudate, no sinus TTP  NECK: no obvious masses on inspection  LUNGS: clear to auscultation bilaterally, no wheezes, rales or rhonchi, good air movement  CV: HRRR, no peripheral edema  MS: moves all extremities without noticeable abnormality  PSYCH: pleasant and cooperative, no obvious depression or anxiety  ASSESSMENT AND PLAN:  Discussed the following assessment and plan:  Sinusitis - Plan: amoxicillin (AMOXIL) 875 MG tablet, DISCONTINUED: amoxicillin (AMOXIL) 875 MG tablet  -discussed options, more like viral or allergic and she will start back on claritin, abx given incase worsens or persist as had some sinus pain and chills but now improving. -advised to quit smoking -Patient advised to return or notify a doctor immediately if symptoms worsen or persist or new concerns arise.  There are no Patient Instructions on file for this visit.   Kriste Basque R.

## 2012-11-04 ENCOUNTER — Other Ambulatory Visit: Payer: Self-pay | Admitting: Internal Medicine

## 2012-11-06 ENCOUNTER — Telehealth: Payer: Self-pay

## 2012-11-06 NOTE — Telephone Encounter (Signed)
Tramadol refill reqeust.

## 2012-11-06 NOTE — Telephone Encounter (Signed)
Please ask patient whether she has taken Tramadol AND Hydrocodone in the past, it is npot really clear from Dr. Jodean Lima note

## 2012-11-11 ENCOUNTER — Other Ambulatory Visit: Payer: Self-pay | Admitting: *Deleted

## 2012-11-11 MED ORDER — TRAMADOL HCL 50 MG PO TABS: 50.0000 mg | ORAL_TABLET | Freq: Four times a day (QID) | ORAL | Status: DC | PRN

## 2012-11-16 ENCOUNTER — Other Ambulatory Visit: Payer: Self-pay | Admitting: Internal Medicine

## 2012-11-17 ENCOUNTER — Encounter
Payer: BC Managed Care – PPO | Attending: Physical Medicine and Rehabilitation | Admitting: Physical Medicine and Rehabilitation

## 2012-11-17 ENCOUNTER — Encounter: Payer: Self-pay | Admitting: Physical Medicine and Rehabilitation

## 2012-11-17 VITALS — BP 115/84 | HR 105 | Resp 14 | Ht 63.0 in | Wt 174.0 lb

## 2012-11-17 DIAGNOSIS — Z981 Arthrodesis status: Secondary | ICD-10-CM | POA: Insufficient documentation

## 2012-11-17 DIAGNOSIS — M545 Low back pain, unspecified: Secondary | ICD-10-CM | POA: Insufficient documentation

## 2012-11-17 DIAGNOSIS — E785 Hyperlipidemia, unspecified: Secondary | ICD-10-CM | POA: Insufficient documentation

## 2012-11-17 DIAGNOSIS — M79609 Pain in unspecified limb: Secondary | ICD-10-CM | POA: Insufficient documentation

## 2012-11-17 DIAGNOSIS — M76899 Other specified enthesopathies of unspecified lower limb, excluding foot: Secondary | ICD-10-CM | POA: Insufficient documentation

## 2012-11-17 DIAGNOSIS — I10 Essential (primary) hypertension: Secondary | ICD-10-CM | POA: Insufficient documentation

## 2012-11-17 DIAGNOSIS — IMO0001 Reserved for inherently not codable concepts without codable children: Secondary | ICD-10-CM | POA: Insufficient documentation

## 2012-11-17 DIAGNOSIS — F17200 Nicotine dependence, unspecified, uncomplicated: Secondary | ICD-10-CM | POA: Insufficient documentation

## 2012-11-17 DIAGNOSIS — M961 Postlaminectomy syndrome, not elsewhere classified: Secondary | ICD-10-CM | POA: Insufficient documentation

## 2012-11-17 MED ORDER — MELOXICAM 15 MG PO TABS: 15.0000 mg | ORAL_TABLET | Freq: Every day | ORAL | Status: DC

## 2012-11-17 MED ORDER — DICLOFENAC EPOLAMINE 1.3 % TD PTCH: 1.0000 | MEDICATED_PATCH | Freq: Two times a day (BID) | TRANSDERMAL | Status: DC

## 2012-11-17 NOTE — Progress Notes (Addendum)
Subjective:    Patient ID: Catherine Craig, female    DOB: 08-19-1955, 57 y.o.   MRN: [id number]  HPI Low back pain and left leg pain.  Underwent L4-5 and L5-S1 decompression and fusion 04/16/2012. Had good postoperative healing. Has had followup appointments with neurosurgery and x-rays demonstrating good placement of hardware.  Continues to have low back pain and this is above the level of the fusion, radiating into her anterior thighs bilateral, in a L4 distribution. Left posterior leg pain has improved since the fusion and decompression.  Other pain related conditions include chronic carpal tunnel syndrome diagnosed with EMG and nerve conduction.   Very athletic in her younger days, coached sports until age 37. Patient reports that she works out in a pool every day, she does exercises she learned from PT. The patient reports that she has to drive to [place] tomorrow and is a little concerned about the long drive.  Tried Lyrica in the past for her fibromyalgia syndrome. Does cause swelling and excessive drowsiness.  Pain Inventory Average Pain 8 Pain Right Now 4 My pain is stabbing, tingling and aching  In the last 24 hours, has pain interfered with the following? General activity 5 Relation with others 5 Enjoyment of life 5 What TIME of day is your pain at its worst? evening and night Sleep (in general) Fair  Pain is worse with: walking and standing Pain improves with: rest Relief from Meds: 6  Mobility walk without assistance how many minutes can you walk? 15-20 ability to climb steps?  yes do you drive?  yes Do you have any goals in this area?  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  yes  Neuro/Psych numbness tingling trouble walking anxiety  Prior Studies Any changes since last visit?  no  Physicians involved in your care Any  changes since last visit?  no   Family History  Problem Relation Age of Onset  . COPD Neg Hx     family  . Heart disease Neg Hx     family  . ADD / ADHD Neg Hx     family   History   Social History  . Marital Status: Divorced    Spouse Name: N/A    Number of Children: N/A  . Years of Education: N/A   Social History Main Topics  . Smoking status: Current Every Day Smoker -- 1.00 packs/day for 30 years    Types: Cigarettes  . Smokeless tobacco: Never Used     Comment: using electronic cigarette as of 03/2012  . Alcohol Use: Yes     Comment: social  . Drug Use: No  . Sexually Active: None   Other Topics Concern  . None   Social History Narrative  . None   Past Surgical History  Procedure Laterality Date  . Appendectomy    . Tonsillectomy    . Dilation and curettage of uterus    . Tonsillectomy    . Eye surgery      Lazer to right x 6  . Spine surgery  04/16/2012   Past Medical History  Diagnosis Date  . ADHD 02/10/2007  . ALLERGIC RHINITIS 12/15/2006  . BASAL CELL CARCINOMA, HX OF 07/18/2009  . FIBROMYALGIA 12/15/2006  . GERD 12/15/2006  . HYPERLIPIDEMIA 12/15/2006  . HYPERTENSION 12/15/2006  . MENOPAUSAL SYNDROME 12/15/2006  . OSTEOARTHRITIS 02/11/2008  .  Renal colic 07/05/2008  . VAGINITIS, ATROPHIC 01/05/2007  . Histoplasmosis with retinitis   . Heart murmur     earlier-  not heard now  . Anxiety   . Shortness of breath   . Cancer     left side of nose  . IBS (irritable bowel syndrome)   . Anal fissure   . Thoracic outlet syndrome   . Lipoma     on chest  . Fibromyalgia    BP 115/84  Pulse 105  Resp 14  Ht 5\' 3"  (1.6 m)  Wt 174 lb (78.926 kg)  BMI 30.83 kg/m2  SpO2 98%     Review of Systems  Musculoskeletal: Positive for back pain.  Neurological: Positive for numbness.  Psychiatric/Behavioral: The patient is nervous/anxious.   All other systems reviewed and are negative.       Objective:   Physical Exam  Constitutional: She is  oriented to person, place, and time. She appears well-developed and well-nourished.  HENT:  Head: Normocephalic.  Neck: Neck supple.  Musculoskeletal: She exhibits tenderness.  Neurological: She is alert and oriented to person, place, and time.  Skin: Skin is warm and dry.  Psychiatric: She has a normal mood and affect.  Symmetric normal motor tone is noted throughout. Normal muscle bulk. Muscle testing reveals 5/5 muscle strength of the upper extremity, and 5/5 of the lower extremity. Full range of motion in upper and lower extremities. ROM of spine is  restricted. Fine motor movements are normal in both hands. Sensory is intact and symmetric to light touch, pinprick and proprioception. DTR in the upper and lower extremity are present and symmetric 2+, except left achilles tendon reflex is a trace. No clonus is noted.  Patient arises from chair with mild difficulty. Narrow based gait with normal arm swing bilateral , able to walk on heels and toes . Tandem walk is stable. No pronator drift.          Assessment & Plan:  1.Lumbar postlaminectomy syndrome. L4-5 and L5-S1 decompression and fusion, 04/16/2012, by Dr. Newell Coral. New radiating Sx ( intermittend numbness and tingling) into her anterior thighs bilateral in a L4 distribution. We will keep an eye on this, she also will follow up with Dr. Newell Coral at the end of this month, for further evaluation. Prescribed Flector patches for an exacerbation/inflammation of her Sx , especially after her long drive. Also prescribed Mobic for her several other arthritic pain, to take when she does not use the flector patch, or if her insurance does not pay for it, which she said most likely will be the case, the patient will give [place] a call.  2. Right trochanteric bursitis, resolved after injection.  3. Fibromyalgia syndrome,Recommend restarting gabapentin 1 tablet twice a day. She has either 100 mg or 300 mg tablets at home. She will call us today about the  exact dose she was on, she forgot to bring  These today.   4.Patient should continue with the aquatic exercises on a regular basis.  Follow up in 3 month Flector patches not covered will send in Volteren gel. Gabapentin was 300mg  hs, per patient, will send this also to her pharmacy.

## 2012-11-17 NOTE — Patient Instructions (Signed)
Continue with your aquatic exercises 

## 2012-11-18 ENCOUNTER — Telehealth: Payer: Self-pay | Admitting: *Deleted

## 2012-11-18 MED ORDER — DICLOFENAC SODIUM 1 % TD GEL: 2.0000 g | Freq: Four times a day (QID) | TRANSDERMAL | Status: DC

## 2012-11-18 MED ORDER — GABAPENTIN 300 MG PO CAPS: 300.0000 mg | ORAL_CAPSULE | Freq: Every evening | ORAL | Status: DC | PRN

## 2012-11-18 NOTE — Telephone Encounter (Signed)
Just saw Catherine Craig (11/17/12).  Patches will not be covered by insurance.  Needs an rx for the "cream".  Her gabapentin was 300 mg.

## 2012-11-18 NOTE — Telephone Encounter (Signed)
Patient called again regarding gabapentin and cream.  Per note this was sent to pharmacy.  Patient aware these scripts are sent to pharmacy.

## 2012-11-18 NOTE — Telephone Encounter (Signed)
Patient seen in office regarding this.

## 2012-11-18 NOTE — Addendum Note (Signed)
Addended by: Su Monks on: 11/18/2012 11:05 AM   Modules accepted: Orders

## 2012-11-18 NOTE — Telephone Encounter (Signed)
Ok, will send both prescription to her pharmacy

## 2012-12-02 ENCOUNTER — Other Ambulatory Visit: Payer: Self-pay | Admitting: Internal Medicine

## 2012-12-02 NOTE — Telephone Encounter (Signed)
Pt also needs samples of crestor

## 2012-12-03 NOTE — Telephone Encounter (Signed)
Spoke to pt told her we do not have samples of Crestor and Rx for Hydrocodone called into pharmacy. Pt verbalized understanding.

## 2013-01-08 ENCOUNTER — Encounter: Payer: Self-pay | Admitting: Internal Medicine

## 2013-01-08 ENCOUNTER — Ambulatory Visit (INDEPENDENT_AMBULATORY_CARE_PROVIDER_SITE_OTHER): Payer: BC Managed Care – PPO | Admitting: Internal Medicine

## 2013-01-08 VITALS — BP 120/80 | HR 95 | Temp 98.9°F | Resp 20 | Wt 182.0 lb

## 2013-01-08 DIAGNOSIS — I10 Essential (primary) hypertension: Secondary | ICD-10-CM

## 2013-01-08 DIAGNOSIS — M1990 Unspecified osteoarthritis, unspecified site: Secondary | ICD-10-CM

## 2013-01-08 DIAGNOSIS — K140 Glossitis: Secondary | ICD-10-CM

## 2013-01-08 MED ORDER — AMPHETAMINE-DEXTROAMPHETAMINE 10 MG PO TABS: 10.0000 mg | ORAL_TABLET | Freq: Two times a day (BID) | ORAL | Status: DC

## 2013-01-08 MED ORDER — FIRST-DUKES MOUTHWASH MT SUSP: OROMUCOSAL | Status: DC

## 2013-01-08 NOTE — Progress Notes (Signed)
Subjective:    Patient ID: Catherine Craig, female    DOB: 02/11/1956, 57 y.o.   MRN: [id number]  HPI  57 year old patient who states that she was at the beach a few weeks ago after eating at a local restaurant had considerable sore throat and swelling of the uvula. At the present time she feels that she has some persistent pain and blistering involving the very back of the time. Symptoms are mild and present for about 3 weeks. She also is a history of carpal tunnel syndrome and is complaining of numbness involving both hands the left more prominent than the right.  Past Medical History  Diagnosis Date  . ADHD 02/10/2007  . ALLERGIC RHINITIS 12/15/2006  . BASAL CELL CARCINOMA, HX OF 07/18/2009  . FIBROMYALGIA 12/15/2006  . GERD 12/15/2006  . HYPERLIPIDEMIA 12/15/2006  . HYPERTENSION 12/15/2006  . MENOPAUSAL SYNDROME 12/15/2006  . OSTEOARTHRITIS 02/11/2008  . Renal colic 07/05/2008  . VAGINITIS, ATROPHIC 01/05/2007  . Histoplasmosis with retinitis   . Heart murmur     earlier-  not heard now  . Anxiety   . Shortness of breath   . Cancer     left side of nose  . IBS (irritable bowel syndrome)   . Anal fissure   . Thoracic outlet syndrome   . Lipoma     on chest  . Fibromyalgia     History   Social History  . Marital Status: Divorced    Spouse Name: N/A    Number of Children: N/A  . Years of Education: N/A   Occupational History  . Not on file.   Social History Main Topics  . Smoking status: Current Every Day Smoker -- 1.00 packs/day for 30 years    Types: Cigarettes  . Smokeless tobacco: Never Used     Comment: using electronic cigarette as of 03/2012  . Alcohol Use: Yes     Comment: social  . Drug Use: No  . Sexually Active: Not on file   Other Topics Concern  . Not on file   Social History Narrative  . No narrative on file    Past Surgical History  Procedure Laterality Date  . Appendectomy    . Tonsillectomy    . Dilation and curettage of uterus    .  Tonsillectomy    . Eye surgery      Lazer to right x 6  . Spine surgery  04/16/2012    Family History  Problem Relation Age of Onset  . COPD Neg Hx     family  . Heart disease Neg Hx     family  . ADD / ADHD Neg Hx     family    Allergies  Allergen Reactions  . Ivp Dye (Iodinated Diagnostic Agents) Hives    Fluroscan eye dye per patient  . Lyrica (Pregabalin) Swelling  . Chantix (Varenicline Tartrate) Nausea And Vomiting    Current Outpatient Prescriptions on File Prior to Visit  Medication Sig Dispense Refill  . amphetamine-dextroamphetamine (ADDERALL) 10 MG tablet Take 1 tablet (10 mg total) by mouth 2 (two) times daily.  60 tablet  0  . aspirin 325 MG tablet Take 325 mg by mouth at bedtime.       Marland Kitchen b complex vitamins tablet Take 1 tablet by mouth at bedtime.      . Calcium Citrate-Vitamin D (CITRACAL + D PO) Take 1 tablet by mouth 2 (two) times daily.      . Coenzyme Q10 (COQ-10)  200 MG CAPS Take 1 tablet by mouth 2 (two) times daily.      . diclofenac (FLECTOR) 1.3 % PTCH Place 1 patch onto the skin 2 (two) times daily.  30 patch  1  . diclofenac sodium (VOLTAREN) 1 % GEL Apply 2 g topically 4 (four) times daily.  2 Tube  2  . fexofenadine-pseudoephedrine (ALLEGRA-D) 60-120 MG per tablet Take 1 tablet by mouth daily as needed. For allergies      . furosemide (LASIX) 20 MG tablet Take 20 mg by mouth daily as needed. For swelling      . furosemide (LASIX) 20 MG tablet TAKE 1 TABLET BY MOUTH ONCE DAILY  90 tablet  1  . gabapentin (NEURONTIN) 300 MG capsule Take 1 capsule (300 mg total) by mouth at bedtime as needed.  30 capsule  2  . HYDROcodone-acetaminophen (NORCO) 10-325 MG per tablet TAKE 1 TABLET BY MOUTH EVERY 6 HOURS AS NEEDED  120 tablet  2  . KRILL OIL PO Take 1 tablet by mouth every morning.      . meloxicam (MOBIC) 15 MG tablet Take 1 tablet (15 mg total) by mouth daily.  30 tablet  1  . Multiple Vitamin (MULTIVITAMIN WITH MINERALS) TABS Take 0.5 tablets by mouth 2  (two) times daily.      . naproxen sodium (ANAPROX) 220 MG tablet Take 220 mg by mouth 2 (two) times daily with a meal.      . traMADol (ULTRAM) 50 MG tablet Take 1 tablet (50 mg total) by mouth every 6 (six) hours as needed for pain.  120 tablet  1  . triamterene-hydrochlorothiazide (MAXZIDE) 75-50 MG per tablet Take 0.5 tablets by mouth daily.      . vitamin C (ASCORBIC ACID) 500 MG tablet Take 500 mg by mouth 2 (two) times daily.      . rosuvastatin (CRESTOR) 10 MG tablet Take 1 tablet (10 mg total) by mouth at bedtime.  90 tablet  3  . [DISCONTINUED] fluticasone (FLONASE) 50 MCG/ACT nasal spray Place 1 spray into the nose daily.  16 g  2  . [DISCONTINUED] pravastatin (PRAVACHOL) 40 MG tablet Take 1 tablet (40 mg total) by mouth every evening.  90 tablet  3   No current facility-administered medications on file prior to visit.    BP 120/80  Pulse 95  Temp(Src) 98.9 F (37.2 C) (Oral)  Resp 20  Wt 182 lb (82.555 kg)  BMI 32.25 kg/m2  SpO2 96%       Review of Systems  Constitutional: Negative.   HENT: Positive for sore throat and trouble swallowing. Negative for hearing loss, congestion, rhinorrhea, dental problem, sinus pressure and tinnitus.   Eyes: Negative for pain, discharge and visual disturbance.  Respiratory: Negative for cough and shortness of breath.   Cardiovascular: Negative for chest pain, palpitations and leg swelling.  Gastrointestinal: Negative for nausea, vomiting, abdominal pain, diarrhea, constipation, blood in stool and abdominal distention.  Genitourinary: Negative for dysuria, urgency, frequency, hematuria, flank pain, vaginal bleeding, vaginal discharge, difficulty urinating, vaginal pain and pelvic pain.  Musculoskeletal: Negative for joint swelling, arthralgias and gait problem.  Skin: Negative for rash.  Neurological: Positive for numbness. Negative for dizziness, syncope, speech difficulty, weakness and headaches.  Hematological: Negative for  adenopathy.  Psychiatric/Behavioral: Negative for behavioral problems, dysphoric mood and agitation. The patient is not nervous/anxious.        Objective:   Physical Exam  Constitutional: She appears well-developed and well-nourished.  HENT:  Mild glossitis with prominent papilla and erythema most marked involving the base of the tongue  Musculoskeletal:  Positive Tinel's on the left  Lymphadenopathy:    She has no cervical adenopathy.          Assessment & Plan:   Glossitis. Will treat with a Dukes Magic mouthwash Hypertension stable Carpal tunnel syndrome. A wrist splint encouraged

## 2013-01-08 NOTE — Patient Instructions (Signed)
Call or return to clinic prn if these symptoms worsen or fail to improve as anticipated.

## 2013-01-15 ENCOUNTER — Other Ambulatory Visit: Payer: Self-pay

## 2013-01-15 MED ORDER — TRAMADOL HCL 50 MG PO TABS: 50.0000 mg | ORAL_TABLET | Freq: Four times a day (QID) | ORAL | Status: DC | PRN

## 2013-02-16 ENCOUNTER — Other Ambulatory Visit: Payer: Self-pay | Admitting: Internal Medicine

## 2013-02-16 ENCOUNTER — Other Ambulatory Visit: Payer: Self-pay

## 2013-02-16 MED ORDER — TRAMADOL HCL 50 MG PO TABS: 50.0000 mg | ORAL_TABLET | Freq: Four times a day (QID) | ORAL | Status: DC | PRN

## 2013-02-16 NOTE — Telephone Encounter (Signed)
Refill of tramadol called in at [facility]

## 2013-02-17 ENCOUNTER — Encounter: Payer: BC Managed Care – PPO | Admitting: Physical Medicine and Rehabilitation

## 2013-02-19 ENCOUNTER — Ambulatory Visit (HOSPITAL_BASED_OUTPATIENT_CLINIC_OR_DEPARTMENT_OTHER): Payer: BC Managed Care – PPO | Admitting: Physical Medicine & Rehabilitation

## 2013-02-19 ENCOUNTER — Encounter: Payer: Self-pay | Admitting: Physical Medicine & Rehabilitation

## 2013-02-19 ENCOUNTER — Encounter: Payer: BC Managed Care – PPO | Attending: Physical Medicine and Rehabilitation

## 2013-02-19 VITALS — BP 136/78 | HR 118 | Resp 14 | Ht 63.0 in | Wt 170.8 lb

## 2013-02-19 DIAGNOSIS — M961 Postlaminectomy syndrome, not elsewhere classified: Secondary | ICD-10-CM | POA: Insufficient documentation

## 2013-02-19 DIAGNOSIS — IMO0001 Reserved for inherently not codable concepts without codable children: Secondary | ICD-10-CM | POA: Insufficient documentation

## 2013-02-19 DIAGNOSIS — Z981 Arthrodesis status: Secondary | ICD-10-CM | POA: Insufficient documentation

## 2013-02-19 DIAGNOSIS — M76899 Other specified enthesopathies of unspecified lower limb, excluding foot: Secondary | ICD-10-CM | POA: Insufficient documentation

## 2013-02-19 DIAGNOSIS — F17200 Nicotine dependence, unspecified, uncomplicated: Secondary | ICD-10-CM | POA: Insufficient documentation

## 2013-02-19 DIAGNOSIS — M545 Low back pain, unspecified: Secondary | ICD-10-CM | POA: Insufficient documentation

## 2013-02-19 DIAGNOSIS — IMO0002 Reserved for concepts with insufficient information to code with codable children: Secondary | ICD-10-CM

## 2013-02-19 DIAGNOSIS — E785 Hyperlipidemia, unspecified: Secondary | ICD-10-CM | POA: Insufficient documentation

## 2013-02-19 DIAGNOSIS — M5416 Radiculopathy, lumbar region: Secondary | ICD-10-CM

## 2013-02-19 DIAGNOSIS — I10 Essential (primary) hypertension: Secondary | ICD-10-CM | POA: Insufficient documentation

## 2013-02-19 DIAGNOSIS — M79609 Pain in unspecified limb: Secondary | ICD-10-CM | POA: Insufficient documentation

## 2013-02-19 MED ORDER — OXYCODONE HCL 5 MG PO TABS: 5.0000 mg | ORAL_TABLET | ORAL | Status: DC | PRN

## 2013-02-19 MED ORDER — GABAPENTIN 400 MG PO CAPS: 400.0000 mg | ORAL_CAPSULE | Freq: Every evening | ORAL | Status: DC | PRN

## 2013-02-19 NOTE — Patient Instructions (Signed)
I am recommending an epidural injection. I will send it to the radiology office for this based on your financial concerns. It is something I can do in my office as well. I'm recommending L3-L4 level. They can do either translaminar approach were transforaminal approach. If this is not helpful T12 L1-L2 medial branch blocks may be helpful

## 2013-02-19 NOTE — Progress Notes (Signed)
Subjective:    Patient ID: Catherine Craig, female    DOB: [birth year], 57 y.o.   MRN: [id number]  HPI Low back pain and left leg pain. Underwent physical therapy and chiropractic preoperatively. Underwent left L5 transforaminal lumbar epidural injections preoperatively. Had progression of left leg pain and weakness. Underwent neurosurgical evaluation in September of 2013. Underwent L4-5 and L5-S1 decompression and fusion 04/16/2012. Had good postoperative healing. Has had followup appointments with neurosurgery and x-rays demonstrating good placement of hardware.  Continues to have low back pain and this is above the level of the fusion. Left leg pain has improved since the fusion and decompression.  Other pain related conditions include chronic carpal tunnel syndrome diagnosed with EMG and nerve conduction.  Also with chronic neck pain, no surgery or injections for this, no formal physical therapy for this.  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  Takes 2 aleve in am and 3 advil TID  Pain low back at night sleeps on stomach  Tried Lyrica in the past for her fibromyalgia syndrome. Does cause swelling and excessive drowsiness.  Patient complains of increasing pain and numbness down the front of both thighs. Has been reevaluated by neurosurgery. X-rays demonstrate normal positioning of the hardware. Also has had some problems with weakness in the legs with falls. Feeling some shocks across the back.  Has been exercising in the pool which has been helpful while in the pool. Still has problems when she gets out of the pool. Pain Inventory Average Pain 10 Pain Right Now 6 My pain is constant, tingling and aching  In the last 24 hours, has pain interfered with the following? General activity 10 Relation with others 10 Enjoyment of life 10 What TIME of day is your pain at its worst? varies Sleep  (in general) Poor  Pain is worse with: walking, bending, sitting and standing Pain improves with: rest and medication Relief from Meds: 5  Mobility walk without assistance how many minutes can you walk? 5 ability to climb steps?  yes do you drive?  yes  Function disabled: date disabled . I need assistance with the following:  dressing, bathing, meal prep, household duties and shopping  Neuro/Psych weakness numbness tingling trouble walking spasms depression  Prior Studies Any changes since last visit?  no  Physicians involved in your care Any changes since last visit?  no   Family History  Problem Relation Age of Onset  . COPD Neg Hx     family  . Heart disease Neg Hx     family  . ADD / ADHD Neg Hx     family   History   Social History  . Marital Status: Divorced    Spouse Name: N/A    Number of Children: N/A  . Years of Education: N/A   Social History Main Topics  . Smoking status: Current Every Day Smoker -- 1.00 packs/day for 30 years    Types: Cigarettes  . Smokeless tobacco: Never Used     Comment: using electronic cigarette as of 03/2012  . Alcohol Use: Yes     Comment: social  . Drug Use: No  . Sexual Activity: None   Other Topics Concern  . None   Social History Narrative  . None   Past Surgical History  Procedure Laterality Date  . Appendectomy    . Tonsillectomy    . Dilation and curettage of  uterus    . Tonsillectomy    . Eye surgery      Lazer to right x 6  . Spine surgery  04/16/2012   Past Medical History  Diagnosis Date  . ADHD 02/10/2007  . ALLERGIC RHINITIS 12/15/2006  . BASAL CELL CARCINOMA, HX OF 07/18/2009  . FIBROMYALGIA 12/15/2006  . GERD 12/15/2006  . HYPERLIPIDEMIA 12/15/2006  . HYPERTENSION 12/15/2006  . MENOPAUSAL SYNDROME 12/15/2006  . OSTEOARTHRITIS 02/11/2008  . Renal colic 07/05/2008  . VAGINITIS, ATROPHIC 01/05/2007  . Histoplasmosis with retinitis   . Heart murmur     earlier-  not heard now  . Anxiety   .  Shortness of breath   . Cancer     left side of nose  . IBS (irritable bowel syndrome)   . Anal fissure   . Thoracic outlet syndrome   . Lipoma     on chest  . Fibromyalgia    BP 136/78  Pulse 118  Resp 14  Ht 5\' 3"  (1.6 m)  Wt 170 lb 12.8 oz (77.474 kg)  BMI 30.26 kg/m2  SpO2 97%    Review of Systems  Respiratory: Positive for cough.   Gastrointestinal: Positive for constipation.  Musculoskeletal: Positive for back pain and gait problem.       Spasms  Neurological: Positive for weakness and numbness.       Tingling  Psychiatric/Behavioral: Positive for dysphoric mood.  All other systems reviewed and are negative.       Objective:   Physical Exam  Constitutional: She is oriented to person, place, and time.  Musculoskeletal:  Right hip: She exhibits tenderness.  Left hip: She exhibits tenderness.  Right knee: Normal.  Left knee: Normal.  Lumbar back: She exhibits decreased range of motion, tenderness and pain.  Pain in lumbar paraspinals above the level of fusion Evidence of The thoracolumbar scoliosis convex right mild to moderate Tenderness over the left PSIS Tenderness over the right greater than left hip trochanteric bursa area  Neurological: She is alert and oriented to person, place, and time. She displays no tremor. She exhibits abnormal muscle tone.  Reflex Scores:  Tricep reflexes are 2+ on the right side and 2+ on the left side.  Bicep reflexes are 2+ on the right side and 2+ on the left side.  Brachioradialis reflexes are 2+ on the right side and 2+ on the left side.  Patellar reflexes are 2+ on the right side and 2+ on the left side.  Achilles reflexes are 1+ on the right side and 1+ on the left side. Motor strength normal with the exception of left EHL weakness at 3+/5  Femoral stretch test positive     Assessment & Plan:  1.Lumbar postlaminectomy syndrome. L4-5 and L5-S1 decompression and fusion was 3 months ago. Leg painImproved. Residual left  EHL weakness. Residual pain in the upper lumbar area. Also has symptoms consistent with L3 radiculitis. Has femoral stretch test. Recommend L3-L4 epidural injection. Patient requests an office that does not have facility feet. Will prefer to [facility] Return to clinic for followup after injection. If no relief consider facet injections versus repeat MRI  2. Right greater than left hip trochanteric bursitis improved after injection in Feb 2014  3. Fibromyalgia syndrome, this does not appear to be the main pain generator currently 4. Deconditioning referral made to aquatic therapy to instruct home exercise program. Patient does have access to a pool, recommend [employer] once unable to swim outside

## 2013-02-23 ENCOUNTER — Other Ambulatory Visit: Payer: Self-pay | Admitting: Physical Medicine & Rehabilitation

## 2013-02-23 DIAGNOSIS — M961 Postlaminectomy syndrome, not elsewhere classified: Secondary | ICD-10-CM

## 2013-03-22 ENCOUNTER — Encounter: Payer: Self-pay | Admitting: Physical Medicine & Rehabilitation

## 2013-03-22 ENCOUNTER — Ambulatory Visit (HOSPITAL_BASED_OUTPATIENT_CLINIC_OR_DEPARTMENT_OTHER): Payer: BC Managed Care – PPO | Admitting: Physical Medicine & Rehabilitation

## 2013-03-22 ENCOUNTER — Encounter: Payer: BC Managed Care – PPO | Attending: Physical Medicine and Rehabilitation

## 2013-03-22 VITALS — BP 128/87 | HR 120 | Resp 16 | Ht 63.0 in | Wt 171.0 lb

## 2013-03-22 DIAGNOSIS — M549 Dorsalgia, unspecified: Secondary | ICD-10-CM

## 2013-03-22 DIAGNOSIS — M961 Postlaminectomy syndrome, not elsewhere classified: Secondary | ICD-10-CM | POA: Insufficient documentation

## 2013-03-22 DIAGNOSIS — F17200 Nicotine dependence, unspecified, uncomplicated: Secondary | ICD-10-CM | POA: Insufficient documentation

## 2013-03-22 DIAGNOSIS — Z5181 Encounter for therapeutic drug level monitoring: Secondary | ICD-10-CM

## 2013-03-22 DIAGNOSIS — Z79899 Other long term (current) drug therapy: Secondary | ICD-10-CM

## 2013-03-22 DIAGNOSIS — IMO0001 Reserved for inherently not codable concepts without codable children: Secondary | ICD-10-CM | POA: Insufficient documentation

## 2013-03-22 DIAGNOSIS — M545 Low back pain, unspecified: Secondary | ICD-10-CM | POA: Insufficient documentation

## 2013-03-22 DIAGNOSIS — M76899 Other specified enthesopathies of unspecified lower limb, excluding foot: Secondary | ICD-10-CM | POA: Insufficient documentation

## 2013-03-22 DIAGNOSIS — IMO0002 Reserved for concepts with insufficient information to code with codable children: Secondary | ICD-10-CM

## 2013-03-22 DIAGNOSIS — M5416 Radiculopathy, lumbar region: Secondary | ICD-10-CM

## 2013-03-22 DIAGNOSIS — M79609 Pain in unspecified limb: Secondary | ICD-10-CM | POA: Insufficient documentation

## 2013-03-22 DIAGNOSIS — I10 Essential (primary) hypertension: Secondary | ICD-10-CM | POA: Insufficient documentation

## 2013-03-22 DIAGNOSIS — Z981 Arthrodesis status: Secondary | ICD-10-CM | POA: Insufficient documentation

## 2013-03-22 DIAGNOSIS — E785 Hyperlipidemia, unspecified: Secondary | ICD-10-CM | POA: Insufficient documentation

## 2013-03-22 MED ORDER — OXYCODONE-ACETAMINOPHEN 5-325 MG PO TABS: 1.0000 | ORAL_TABLET | Freq: Four times a day (QID) | ORAL | Status: DC | PRN

## 2013-03-22 NOTE — Progress Notes (Signed)
Subjective:    Patient ID: Catherine Craig, female    DOB: 1955-07-20, 57 y.o.   MRN: [id number] Low back pain and left leg pain. Underwent physical therapy and chiropractic preoperatively. Underwent left L5 transforaminal lumbar epidural injections preoperatively. Had progression of left leg pain and weakness. Underwent neurosurgical evaluation in September of 2013. Underwent L4-5 and L5-S1 decompression and fusion 04/16/2012. Had good postoperative healing. Has had followup appointments with neurosurgery and x-rays demonstrating good placement of hardware.  Continues to have low back pain and this is above the level of the fusion. Left leg pain has improved since the fusion and decompression.  Other pain related conditions include chronic carpal tunnel syndrome diagnosed with EMG and nerve conduction.  Also with chronic neck pain, no surgery or injections for this, no formal physical therapy for this.  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  Takes 2 aleve in am and 3 advil TID  Pain low back at night sleeps on stomach  Tried Lyrica in the past for her fibromyalgia syndrome. Does cause swelling and excessive drowsiness.  HPI We discussed epidural injections. Order has been sent last visit for epidural at [facility]. Instruct patient to call them to see whether an appointment has been set up yet.  Continues to have leg pain thigh pain buttocks pain Requests a change to Percocet from oxycodone. Because of finger arthritis she has difficulty with smaller tablets Pain Inventory Average Pain 8 Pain Right Now 6 My pain is constant and electrical shock  In the last 24 hours, has pain interfered with the following? General activity 8 Relation with others 8 Enjoyment of life 8 What TIME of day is your pain at its worst? evening Sleep (in general) Fair  Pain is worse with: walking, bending,  sitting, standing and some activites Pain improves with: rest and medication Relief from Meds: 7  Mobility walk without assistance how many minutes can you walk? 10 ability to climb steps?  yes do you drive?  yes Do you have any goals in this area?  yes  Function disabled: date disabled 2013 Do you have any goals in this area?  yes  Neuro/Psych bowel control problems weakness numbness tingling trouble walking spasms depression  Prior Studies Any changes since last visit?  no  Physicians involved in your care Any changes since last visit?  no   Family History  Problem Relation Age of Onset  . COPD Neg Hx     family  . Heart disease Neg Hx     family  . ADD / ADHD Neg Hx     family   History   Social History  . Marital Status: Divorced    Spouse Name: N/A    Number of Children: N/A  . Years of Education: N/A   Social History Main Topics  . Smoking status: Current Every Day Smoker -- 1.00 packs/day for 30 years    Types: Cigarettes  . Smokeless tobacco: Never Used     Comment: using electronic cigarette as of 03/2012  . Alcohol Use: Yes     Comment: social  . Drug Use: No  . Sexual Activity: None   Other Topics Concern  . None   Social History Narrative  . None   Past Surgical History  Procedure Laterality Date  . Appendectomy    . Tonsillectomy    . Dilation and curettage of uterus    .  Tonsillectomy    . Eye surgery      Lazer to right x 6  . Spine surgery  04/16/2012   Past Medical History  Diagnosis Date  . ADHD 02/10/2007  . ALLERGIC RHINITIS 12/15/2006  . BASAL CELL CARCINOMA, HX OF 07/18/2009  . FIBROMYALGIA 12/15/2006  . GERD 12/15/2006  . HYPERLIPIDEMIA 12/15/2006  . HYPERTENSION 12/15/2006  . MENOPAUSAL SYNDROME 12/15/2006  . OSTEOARTHRITIS 02/11/2008  . Renal colic 07/05/2008  . VAGINITIS, ATROPHIC 01/05/2007  . Histoplasmosis with retinitis   . Heart murmur     earlier-  not heard now  . Anxiety   . Shortness of breath   .  Cancer     left side of nose  . IBS (irritable bowel syndrome)   . Anal fissure   . Thoracic outlet syndrome   . Lipoma     on chest  . Fibromyalgia    BP 128/87  Pulse 120  Resp 16  Ht 5\' 3"  (1.6 m)  Wt 171 lb (77.565 kg)  BMI 30.3 kg/m2  SpO2 96%     Review of Systems  Gastrointestinal: Positive for constipation.  Musculoskeletal: Positive for back pain and gait problem.  Neurological: Positive for weakness and numbness.  Psychiatric/Behavioral: Positive for dysphoric mood.  All other systems reviewed and are negative.       Objective:   Physical Exam    Musculoskeletal:  Right hip: She exhibits tenderness.  Left hip: She exhibits tenderness.  Right knee: Normal.  Left knee: Normal.  Lumbar back: She exhibits decreased range of motion, tenderness and pain.  Pain in lumbar paraspinals above the level of fusion Evidence of The thoracolumbar scoliosis convex right mild to moderate Tenderness over the left PSIS Tenderness over the right greater than left hip trochanteric bursa area  Neurological: She is alert and oriented to person, place, and time. She displays no tremor. She exhibits abnormal muscle tone.  Reflex Scores:      Assessment & Plan:  1.Lumbar postlaminectomy syndrome. L4-5 and L5-S1 decompression and fusion was 3 months ago. Leg painImproved. Residual left EHL weakness. Residual pain in the upper lumbar area. Also has symptoms consistent with L3 radiculitis. Has femoral stretch test. Recommend L3-L4 epidural injection. Patient requests an office that does not have facility feet. Will prefer to [facility] imaging  Return to clinic for followup after injection. If no relief consider facet injections versus repeat MRI  2. Right greater than left hip trochanteric bursitis improved after injection in Feb 2014  3. Fibromyalgia syndrome, this does not appear to be the main pain generator currently  4. Deconditioning referral made to aquatic therapy to  instruct home exercise program. Patient does have access to a pool, recommend [employer] once unable to swim outside   Forgot pills for count. Had none left. He'll date was 02/19/2013. Reinforce bring any prescriptions filled by this office for pill counts at every visit Percocet 5/325 #120 one month supply Urine drug screen today. Review results prior to any further prescriptions for narcotic analgesics.

## 2013-03-22 NOTE — Patient Instructions (Signed)
Change from oxycodone to Percocet since the pills are bigger and easier to handle.  We'll check a urine drug screen. If no sign of oxycodone or signs of illegal drugs we will not feel the continue your pain medicine prescription  Return in one month

## 2013-03-30 ENCOUNTER — Ambulatory Visit: Payer: BC Managed Care – PPO

## 2013-03-30 ENCOUNTER — Telehealth: Payer: Self-pay | Admitting: Internal Medicine

## 2013-03-30 NOTE — Telephone Encounter (Signed)
ok 

## 2013-03-30 NOTE — Telephone Encounter (Signed)
Spoke to pt told her Dr. Kirtland Bouchard said okay for flu shot as long as no fever. Pt verbalized understanding.

## 2013-03-30 NOTE — Telephone Encounter (Signed)
Pt is having deep cough, phelm, head congestion and would like to know if it is ok to get the flu shot tomorrow. Pt has been very sick w/ this for about 3 wks. No fever. pls advise.

## 2013-03-31 ENCOUNTER — Ambulatory Visit (INDEPENDENT_AMBULATORY_CARE_PROVIDER_SITE_OTHER): Payer: BC Managed Care – PPO

## 2013-03-31 DIAGNOSIS — Z23 Encounter for immunization: Secondary | ICD-10-CM

## 2013-04-08 ENCOUNTER — Telehealth: Payer: Self-pay

## 2013-04-08 NOTE — Telephone Encounter (Signed)
Tried to contact patient regarding her medication request.  Patient should be seen.

## 2013-04-08 NOTE — Telephone Encounter (Signed)
Patient called to say she is taking 10mg  of her percocet instead of 5 and it is working better, but she will be out of her medication.

## 2013-04-09 ENCOUNTER — Ambulatory Visit: Payer: BC Managed Care – PPO | Admitting: Physical Medicine & Rehabilitation

## 2013-04-09 NOTE — Telephone Encounter (Signed)
Left message for patient to call office and schedule appointment to discuss medication.

## 2013-04-12 ENCOUNTER — Encounter: Payer: BC Managed Care – PPO | Attending: Physical Medicine & Rehabilitation

## 2013-04-12 ENCOUNTER — Encounter: Payer: Self-pay | Admitting: Physical Medicine & Rehabilitation

## 2013-04-12 ENCOUNTER — Ambulatory Visit (HOSPITAL_BASED_OUTPATIENT_CLINIC_OR_DEPARTMENT_OTHER): Payer: BC Managed Care – PPO | Admitting: Physical Medicine & Rehabilitation

## 2013-04-12 VITALS — BP 138/80 | HR 93 | Resp 14 | Ht 63.5 in | Wt 177.6 lb

## 2013-04-12 DIAGNOSIS — Z5181 Encounter for therapeutic drug level monitoring: Secondary | ICD-10-CM

## 2013-04-12 DIAGNOSIS — M961 Postlaminectomy syndrome, not elsewhere classified: Secondary | ICD-10-CM | POA: Insufficient documentation

## 2013-04-12 DIAGNOSIS — IMO0001 Reserved for inherently not codable concepts without codable children: Secondary | ICD-10-CM

## 2013-04-12 DIAGNOSIS — Z79899 Other long term (current) drug therapy: Secondary | ICD-10-CM

## 2013-04-12 MED ORDER — OXYCODONE HCL 10 MG PO TABS: 10.0000 mg | ORAL_TABLET | Freq: Three times a day (TID) | ORAL | Status: DC

## 2013-04-12 MED ORDER — GABAPENTIN 100 MG PO CAPS: 100.0000 mg | ORAL_CAPSULE | Freq: Three times a day (TID) | ORAL | Status: DC

## 2013-04-12 NOTE — Progress Notes (Signed)
Subjective:    Patient ID: Catherine Craig, female    DOB: 1955/08/14, 57 y.o.   MRN: [id number]  HPI Low back pain and left leg pain. Underwent physical therapy and chiropractic preoperatively. Underwent left L5 transforaminal lumbar epidural injections preoperatively. Had progression of left leg pain and weakness. Underwent neurosurgical evaluation in September of 2013. Underwent L4-5 and L5-S1 decompression and fusion 04/16/2012. Had good postoperative healing. Has had followup appointments with neurosurgery and x-rays demonstrating good placement of hardware.  Continues to have low back pain and this is above the level of the fusion. Left leg pain has improved since the fusion and decompression.  Other pain related conditions include chronic carpal tunnel syndrome diagnosed with EMG and nerve conduction.  Also with chronic neck pain, no surgery or injections for this, no formal physical therapy for this.  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  Takes 2 aleve in am and qhs.  Ibuprofen OTC as needed once a day  Pain low back at night sleeps on stomach  Tried Lyrica in the past for her fibromyalgia syndrome. Does cause swelling and excessive drowsiness.  Oxycodone 5/325 prescribed last visit field on 10 6. Patient states she's been taking 2 tablets rather than one, 3 or 4 times per day and is now out since yesterday. Also using tramadol 1-2 tablets at night.  Increased gabapentin to 400 mg was somewhat helpful still wakes up with "electrical pain" at night when she turns over. Pain Inventory Average Pain 6 Pain Right Now 8 My pain is intermittent, sharp, stabbing and tingling  In the last 24 hours, has pain interfered with the following? General activity 8 Relation with others 8 Enjoyment of life 8 What TIME of day is your pain at its worst? varies Sleep (in general) Fair  Pain is worse  with: walking, standing and some activites Pain improves with: rest, pacing activities and medication Relief from Meds: 9  Mobility walk without assistance ability to climb steps?  no do you drive?  yes  Function disabled: date disabled . I need assistance with the following:  bathing, meal prep, household duties and shopping  Neuro/Psych bladder control problems numbness tingling trouble walking spasms depression  Prior Studies Any changes since last visit?  no  Physicians involved in your care Any changes since last visit?  no   Family History  Problem Relation Age of Onset  . COPD Neg Hx     family  . Heart disease Neg Hx     family  . ADD / ADHD Neg Hx     family   History   Social History  . Marital Status: Divorced    Spouse Name: N/A    Number of Children: N/A  . Years of Education: N/A   Social History Main Topics  . Smoking status: Current Every Day Smoker -- 1.00 packs/day for 30 years    Types: Cigarettes  . Smokeless tobacco: Never Used     Comment: using electronic cigarette as of 03/2012  . Alcohol Use: Yes     Comment: social  . Drug Use: No  . Sexual Activity: None   Other Topics Concern  . None   Social History Narrative  . None   Past Surgical History  Procedure Laterality Date  . Appendectomy    . Tonsillectomy    . Dilation and curettage of uterus    . Tonsillectomy    .  Eye surgery      Lazer to right x 6  . Spine surgery  04/16/2012   Past Medical History  Diagnosis Date  . ADHD 02/10/2007  . ALLERGIC RHINITIS 12/15/2006  . BASAL CELL CARCINOMA, HX OF 07/18/2009  . FIBROMYALGIA 12/15/2006  . GERD 12/15/2006  . HYPERLIPIDEMIA 12/15/2006  . HYPERTENSION 12/15/2006  . MENOPAUSAL SYNDROME 12/15/2006  . OSTEOARTHRITIS 02/11/2008  . Renal colic 07/05/2008  . VAGINITIS, ATROPHIC 01/05/2007  . Histoplasmosis with retinitis   . Heart murmur     earlier-  not heard now  . Anxiety   . Shortness of breath   . Cancer     left side  of nose  . IBS (irritable bowel syndrome)   . Anal fissure   . Thoracic outlet syndrome   . Lipoma     on chest  . Fibromyalgia    BP 138/80  Pulse 93  Resp 14  Ht 5' 3.5" (1.613 m)  Wt 177 lb 9.6 oz (80.559 kg)  BMI 30.96 kg/m2  SpO2 97%    Review of Systems  Genitourinary:       Bladder control problems  Musculoskeletal: Positive for back pain.       Spasms  Neurological: Positive for weakness and numbness.       Tingling  Psychiatric/Behavioral: Positive for dysphoric mood.  All other systems reviewed and are negative.       Objective:   Physical Exam  Nursing note and vitals reviewed. Constitutional: She is oriented to person, place, and time. She appears well-developed and well-nourished.  HENT:  Head: Normocephalic and atraumatic.  Eyes: Conjunctivae and EOM are normal. Pupils are equal, round, and reactive to light.  Neck: Normal range of motion.  Neurological: She is alert and oriented to person, place, and time. She has normal strength. A sensory deficit is present. Gait abnormal.  Reflex Scores:      Patellar reflexes are 2+ on the right side and 2+ on the left side.      Achilles reflexes are 2+ on the right side and 2+ on the left side. Negative femoral stretch test Negative straight leg   Reduced left L5 pinprick sensation  Psychiatric: She has a normal mood and affect.    Right PSIS tenderness Mild pain and mild reduction flexion of lumbar spine More severe pain with less than 25% of normal extension of the lumbar spine accompanied by posterior thigh pain      Assessment & Plan:  1. Lumbar postlaminectomy syndrome with both mechanical back pain as well as nerve root pain. She would benefit from epidural injection and possibly right sacroiliac injection as she has motion related pain below the level of the L4-S1 fusion. We discussed these injections and that these can be done in a clinic without a facility fee. We made a referral to reach for a  radiology but no call back so far. The patient will see her neurosurgeon this week and asked whether that can be done in his office.  Also patient has chronic narcotic management. This can also be done at neurosurgery office. Patient will inquire about this.  Ran out of medicine early. We'll need to recheck your drug screen.should only see oxycodone adn metabolites We'll give a prescription for oxycodone 10 mg #90 1 3 times per day,   Also for the nerve pain at night will increase gabapentin  800 mg at night.  Can see pt back for medication management if she doesn't establish with  [facility]

## 2013-04-12 NOTE — Patient Instructions (Addendum)
We have increased oxycodone dose from 5 mg to 10 mg  taking 3 tablets per day. We have increase the dose of gabapentin from 400 mg at night to 800 mg at night We have added a 100 mg dose of gabapentin 3 times a day  Since he needs both injections as well as pain medicines and your insurance is better for clinics without a facility he, asked Dr. Newell Coral whether one of the 2 physicians in his office may be able to take over management.  I will see you back in 1 month if you do not getting with either Dr Murray Hodgkins or Dr Ollen Bowl

## 2013-04-20 ENCOUNTER — Ambulatory Visit: Payer: BC Managed Care – PPO | Admitting: Physical Medicine & Rehabilitation

## 2013-05-12 ENCOUNTER — Telehealth: Payer: Self-pay

## 2013-05-12 MED ORDER — OXYCODONE HCL 10 MG PO TABS: 10.0000 mg | ORAL_TABLET | Freq: Three times a day (TID) | ORAL | Status: DC

## 2013-05-12 NOTE — Telephone Encounter (Signed)
Left message advising patient her RX is ready for pick up and if she would like to change medications she would need to be seen.

## 2013-05-12 NOTE — Telephone Encounter (Signed)
Patient called requesting oxycodone refill.  She would rather have percocet because its hard for her to pick up the others.

## 2013-05-17 ENCOUNTER — Other Ambulatory Visit: Payer: Self-pay | Admitting: Physical Medicine & Rehabilitation

## 2013-05-17 NOTE — Telephone Encounter (Signed)
Looks like there should be another refill

## 2013-05-20 ENCOUNTER — Telehealth: Payer: Self-pay

## 2013-05-20 MED ORDER — TRAMADOL HCL 50 MG PO TABS: 50.0000 mg | ORAL_TABLET | Freq: Four times a day (QID) | ORAL | Status: DC

## 2013-05-20 NOTE — Telephone Encounter (Signed)
May refill tramadol 50 mg 4 times a day #120 with 3 refills

## 2013-05-20 NOTE — Telephone Encounter (Signed)
Tramdol 50mg  refill request from [facility].  Please advise.

## 2013-05-20 NOTE — Telephone Encounter (Signed)
[facility]amadol called [facility].  Informed patient.

## 2013-05-26 ENCOUNTER — Other Ambulatory Visit: Payer: Self-pay

## 2013-05-26 DIAGNOSIS — Z1231 Encounter for screening mammogram for malignant neoplasm of breast: Secondary | ICD-10-CM

## 2013-05-27 ENCOUNTER — Other Ambulatory Visit: Payer: Self-pay | Admitting: Internal Medicine

## 2013-06-02 ENCOUNTER — Other Ambulatory Visit: Payer: Self-pay | Admitting: Neurosurgery

## 2013-06-02 DIAGNOSIS — M47816 Spondylosis without myelopathy or radiculopathy, lumbar region: Secondary | ICD-10-CM

## 2013-06-07 ENCOUNTER — Telehealth: Payer: Self-pay

## 2013-06-07 ENCOUNTER — Other Ambulatory Visit: Payer: Self-pay

## 2013-06-07 MED ORDER — GABAPENTIN 400 MG PO CAPS: 400.0000 mg | ORAL_CAPSULE | Freq: Every evening | ORAL | Status: DC | PRN

## 2013-06-07 NOTE — Telephone Encounter (Signed)
Patient is requesting a refill on Oxycodone. Please advise.

## 2013-06-07 NOTE — Telephone Encounter (Signed)
The documentation trail is a little difficult to follow. I thought she was requesting percocet, but last rx was for oxycodone? Otherwise, she is fairly on [facility] for refill date. Could be filled with postdate for 12/24. If you have no concerns we can fill the oxycodone

## 2013-06-08 ENCOUNTER — Ambulatory Visit
Admission: RE | Admit: 2013-06-08 | Discharge: 2013-06-08 | Disposition: A | Discharge status: Home / Self Care | Payer: BC Managed Care – PPO | Source: Ambulatory Visit | Attending: Neurosurgery | Admitting: Neurosurgery

## 2013-06-08 ENCOUNTER — Other Ambulatory Visit: Payer: Self-pay

## 2013-06-08 ENCOUNTER — Telehealth: Payer: Self-pay | Admitting: Internal Medicine

## 2013-06-08 DIAGNOSIS — M47816 Spondylosis without myelopathy or radiculopathy, lumbar region: Secondary | ICD-10-CM

## 2013-06-08 MED ORDER — OXYCODONE HCL 10 MG PO TABS: 10.0000 mg | ORAL_TABLET | Freq: Three times a day (TID) | ORAL | Status: DC

## 2013-06-08 NOTE — Telephone Encounter (Signed)
Contacted patient to inform her that her refill was ready for pickup. Let patient know that she would need to make appt before she would receive a RX for next month and patient stated "she was trying to find another pain doctor that was cheaper." Patient said hopefully she would have established care elsewhere by the time her refill was due again.

## 2013-06-08 NOTE — Telephone Encounter (Signed)
i will refill this time, #90, but any other fills will require a visit here

## 2013-06-08 NOTE — Telephone Encounter (Addendum)
Patient's last OV was 04/12/13 with no return appt. Note states return if symptoms worsen, so I just wanted to make sure it was okay to refill the oxycodone. Patient is also requesting a bigger tablet that she can handle better because of arthritis in her hands.

## 2013-06-08 NOTE — Telephone Encounter (Signed)
Pt need samples of crestor 10 mg and new rx generic adderall 10 mg #60

## 2013-06-09 ENCOUNTER — Other Ambulatory Visit: Payer: Self-pay | Admitting: *Deleted

## 2013-06-09 MED ORDER — AMPHETAMINE-DEXTROAMPHETAMINE 10 MG PO TABS: 10.0000 mg | ORAL_TABLET | Freq: Two times a day (BID) | ORAL | Status: DC

## 2013-06-09 NOTE — Telephone Encounter (Signed)
Sample and Rx ready for pick up and Left message on machine for patient

## 2013-06-30 ENCOUNTER — Ambulatory Visit
Admission: RE | Admit: 2013-06-30 | Discharge: 2013-06-30 | Disposition: A | Discharge status: Home / Self Care | Payer: BC Managed Care – PPO | Source: Ambulatory Visit

## 2013-06-30 DIAGNOSIS — Z1231 Encounter for screening mammogram for malignant neoplasm of breast: Secondary | ICD-10-CM

## 2013-07-16 ENCOUNTER — Encounter: Payer: BC Managed Care – PPO | Attending: Physical Medicine & Rehabilitation

## 2013-07-16 ENCOUNTER — Ambulatory Visit (HOSPITAL_BASED_OUTPATIENT_CLINIC_OR_DEPARTMENT_OTHER): Payer: BC Managed Care – PPO | Admitting: Physical Medicine & Rehabilitation

## 2013-07-16 ENCOUNTER — Encounter: Payer: Self-pay | Admitting: Physical Medicine & Rehabilitation

## 2013-07-16 VITALS — BP 144/91 | HR 108 | Resp 14 | Ht 63.0 in | Wt 177.0 lb

## 2013-07-16 DIAGNOSIS — M545 Low back pain, unspecified: Secondary | ICD-10-CM | POA: Insufficient documentation

## 2013-07-16 DIAGNOSIS — E785 Hyperlipidemia, unspecified: Secondary | ICD-10-CM | POA: Insufficient documentation

## 2013-07-16 DIAGNOSIS — M79609 Pain in unspecified limb: Secondary | ICD-10-CM | POA: Insufficient documentation

## 2013-07-16 DIAGNOSIS — K219 Gastro-esophageal reflux disease without esophagitis: Secondary | ICD-10-CM | POA: Insufficient documentation

## 2013-07-16 DIAGNOSIS — M961 Postlaminectomy syndrome, not elsewhere classified: Secondary | ICD-10-CM | POA: Insufficient documentation

## 2013-07-16 DIAGNOSIS — I10 Essential (primary) hypertension: Secondary | ICD-10-CM | POA: Insufficient documentation

## 2013-07-16 DIAGNOSIS — M48061 Spinal stenosis, lumbar region without neurogenic claudication: Secondary | ICD-10-CM

## 2013-07-16 MED ORDER — OXYCODONE HCL 10 MG PO TABS: 10.0000 mg | ORAL_TABLET | Freq: Three times a day (TID) | ORAL | Status: DC

## 2013-07-16 NOTE — Patient Instructions (Signed)
Back Exercises These exercises may help you when beginning to rehabilitate your injury. Your symptoms may resolve with or without further involvement from your physician, physical therapist or athletic trainer. While completing these exercises, remember:   Restoring tissue flexibility helps normal motion to return to the joints. This allows healthier, less painful movement and activity.  An effective stretch should be held for at least 30 seconds.  A stretch should never be painful. You should only feel a gentle lengthening or release in the stretched tissue. STRETCH  Extension, Prone on Elbows   Lie on your stomach on the floor, a bed will be too soft. Place your palms about shoulder width apart and at the height of your head.  Place your elbows under your shoulders. If this is too painful, stack pillows under your chest.  Allow your body to relax so that your hips drop lower and make contact more completely with the floor.  Hold this position for __________ seconds.  Slowly return to lying flat on the floor. Repeat __________ times. Complete this exercise __________ times per day.  RANGE OF MOTION  Extension, Prone Press Ups   Lie on your stomach on the floor, a bed will be too soft. Place your palms about shoulder width apart and at the height of your head.  Keeping your back as relaxed as possible, slowly straighten your elbows while keeping your hips on the floor. You may adjust the placement of your hands to maximize your comfort. As you gain motion, your hands will come more underneath your shoulders.  Hold this position __________ seconds.  Slowly return to lying flat on the floor. Repeat __________ times. Complete this exercise __________ times per day.  RANGE OF MOTION- Quadruped, Neutral Spine   Assume a hands and knees position on a firm surface. Keep your hands under your shoulders and your knees under your hips. You may place padding under your knees for comfort.  Drop  your head and point your tail bone toward the ground below you. This will round out your low back like an angry cat. Hold this position for __________ seconds.  Slowly lift your head and release your tail bone so that your back sags into a large arch, like an old horse.  Hold this position for __________ seconds.  Repeat this until you feel limber in your low back.  Now, find your "sweet spot." This will be the most comfortable position somewhere between the two previous positions. This is your neutral spine. Once you have found this position, tense your stomach muscles to support your low back.  Hold this position for __________ seconds. Repeat __________ times. Complete this exercise __________ times per day.  STRETCH  Flexion, Single Knee to Chest   Lie on a firm bed or floor with both legs extended in front of you.  Keeping one leg in contact with the floor, bring your opposite knee to your chest. Hold your leg in place by either grabbing behind your thigh or at your knee.  Pull until you feel a gentle stretch in your low back. Hold __________ seconds.  Slowly release your grasp and repeat the exercise with the opposite side. Repeat __________ times. Complete this exercise __________ times per day.  STRETCH - Hamstrings, Standing  Stand or sit and extend your right / left leg, placing your foot on a chair or foot stool  Keeping a slight arch in your low back and your hips straight forward.  Lead with your chest and   lean forward at the waist until you feel a gentle stretch in the back of your right / left knee or thigh. (When done correctly, this exercise requires leaning only a small distance.)  Hold this position for __________ seconds. Repeat __________ times. Complete this stretch __________ times per day. STRENGTHENING  Deep Abdominals, Pelvic Tilt   Lie on a firm bed or floor. Keeping your legs in front of you, bend your knees so they are both pointed toward the ceiling and  your feet are flat on the floor.  Tense your lower abdominal muscles to press your low back into the floor. This motion will rotate your pelvis so that your tail bone is scooping upwards rather than pointing at your feet or into the floor.  With a gentle tension and even breathing, hold this position for __________ seconds. Repeat __________ times. Complete this exercise __________ times per day.  STRENGTHENING  Abdominals, Crunches   Lie on a firm bed or floor. Keeping your legs in front of you, bend your knees so they are both pointed toward the ceiling and your feet are flat on the floor. Cross your arms over your chest.  Slightly tip your chin down without bending your neck.  Tense your abdominals and slowly lift your trunk high enough to just clear your shoulder blades. Lifting higher can put excessive stress on the low back and does not further strengthen your abdominal muscles.  Control your return to the starting position. Repeat __________ times. Complete this exercise __________ times per day.  STRENGTHENING  Quadruped, Opposite UE/LE Lift   Assume a hands and knees position on a firm surface. Keep your hands under your shoulders and your knees under your hips. You may place padding under your knees for comfort.  Find your neutral spine and gently tense your abdominal muscles so that you can maintain this position. Your shoulders and hips should form a rectangle that is parallel with the floor and is not twisted.  Keeping your trunk steady, lift your right hand no higher than your shoulder and then your left leg no higher than your hip. Make sure you are not holding your breath. Hold this position __________ seconds.  Continuing to keep your abdominal muscles tense and your back steady, slowly return to your starting position. Repeat with the opposite arm and leg. Repeat __________ times. Complete this exercise __________ times per day. Document Released: 06/21/2005 Document  Revised: 08/26/2011 Document Reviewed: 09/15/2008 [employer] Patient Information 2014 [employer]  

## 2013-07-16 NOTE — Progress Notes (Signed)
Subjective:    Patient ID: Catherine Craig, female    DOB: [birth year], 58 y.o.   MRN: [id number]  HPI Low back pain and left leg pain. Underwent physical therapy and chiropractic preoperatively. Underwent left L5 transforaminal lumbar epidural injections preoperatively. Had progression of left leg pain and weakness. Underwent neurosurgical evaluation in September of 2013. Underwent L4-5 and L5-S1 decompression and fusion 04/16/2012. Had good postoperative healing. Has had followup appointments with neurosurgery and x-rays demonstrating good placement of hardware.  Continues to have low back pain and this is above the level of the fusion. Left leg pain has improved since the fusion and decompression.   Epidural injection (? Level)  December 2014 by neurosurgery, helped the anterior thigh pain on the right side.  Tried increased dose of gabapentin 800 mg causing excessive sedation. Currently taking 500 -600mg  at night  Will discuss with ophthalmologist regarding eye procedure corneal surgery, no surgery is scheduled at the current time.  Pain Inventory Average Pain 6 Pain Right Now 6 My pain is intermittent, sharp, stabbing and tingling  In the last 24 hours, has pain interfered with the following? General activity 8 Relation with others 8 Enjoyment of life 8 What TIME of day is your pain at its worst? varies Sleep (in general) Fair  Pain is worse with: walking and sleeping Pain improves with: rest and medication Relief from Meds: 8  Mobility walk without assistance ability to climb steps?  no do you drive?  yes  Function disabled: date disabled na I need assistance with the following:  bathing, meal prep, household duties and shopping  Neuro/Psych bladder control problems numbness tingling trouble walking spasms depression  Prior Studies Any changes since last visit?  no  Physicians involved in your care Any changes since last visit?  no   Family History    Problem Relation Age of Onset  . COPD Neg Hx     family  . Heart disease Neg Hx     family  . ADD / ADHD Neg Hx     family   History   Social History  . Marital Status: Divorced    Spouse Name: N/A    Number of Children: N/A  . Years of Education: N/A   Social History Main Topics  . Smoking status: Current Every Day Smoker -- 1.00 packs/day for 30 years    Types: Cigarettes  . Smokeless tobacco: Never Used     Comment: using electronic cigarette as of 03/2012  . Alcohol Use: Yes     Comment: social  . Drug Use: No  . Sexual Activity: None   Other Topics Concern  . None   Social History Narrative  . None   Past Surgical History  Procedure Laterality Date  . Appendectomy    . Tonsillectomy    . Dilation and curettage of uterus    . Tonsillectomy    . Eye surgery      Lazer to right x 6  . Spine surgery  04/16/2012   Past Medical History  Diagnosis Date  . ADHD 02/10/2007  . ALLERGIC RHINITIS 12/15/2006  . BASAL CELL CARCINOMA, HX OF 07/18/2009  . FIBROMYALGIA 12/15/2006  . GERD 12/15/2006  . HYPERLIPIDEMIA 12/15/2006  . HYPERTENSION 12/15/2006  . MENOPAUSAL SYNDROME 12/15/2006  . OSTEOARTHRITIS 02/11/2008  . Renal colic 09/23/8117  . VAGINITIS, ATROPHIC 01/05/2007  . Histoplasmosis with retinitis   . Heart murmur     earlier-  not heard now  . Anxiety   .  Shortness of breath   . Cancer     left side of nose  . IBS (irritable bowel syndrome)   . Anal fissure   . Thoracic outlet syndrome   . Lipoma     on chest  . Fibromyalgia    BP 144/91  Pulse 108  Resp 14  Ht 5\' 3"  (1.6 m)  Wt 177 lb (80.287 kg)  BMI 31.36 kg/m2  SpO2 99%  Opioid Risk Score: 7 Fall Risk Score:  (pt educated on fall risk, brochure given to pt.)    Review of Systems  Genitourinary:       Bladder control problems  Musculoskeletal: Positive for back pain and gait problem.  Neurological: Positive for weakness and numbness.       Spasms, tingling  Psychiatric/Behavioral:  Positive for dysphoric mood.  All other systems reviewed and are negative.       Objective:   Physical Exam  Nursing note and vitals reviewed.  Constitutional: She is oriented to person, place, and time. She appears well-developed and well-nourished.  HENT:  Head: Normocephalic and atraumatic.  Eyes: Conjunctivae and EOM are normal. Pupils are equal, round, and reactive to light.  Neck: Normal range of motion.  Neurological: She is alert and oriented to person, place, and time. She has normal strength. A sensory deficit is present. Gait abnormal.  Reflex Scores:  Patellar reflexes are 2+ on the right side and 2+ on the left side.  Achilles reflexes are 0 on the right side and 0 on the left side. Negative femoral stretch test Negative straight leg   Reduced right L5 pinprick sensation  Psychiatric: She has a normal mood and affect.   Right PSIS tenderness  Mild pain and mild reduction flexion of lumbar spine  More severe pain with less than 25% of normal extension of the lumbar spine accompanied by posterior thigh pain  Good hip ROM     Assessment & Plan:  1. Lumbar postlaminectomy syndrome with both mechanical back pain as well as nerve root pain. She would benefit from epidural injection and possibly right sacroiliac injection as she has motion related pain below the level of the L4-S1 fusion. Her neurosurgeon wanted to be the only physician to do to injections on her spine, as per her report. Her neurosurgeon wanted this clinic to manage the pain medications. Opioid risk tool score 7 moderate risk, monitor closely with pill counts and urine drug screen.  Oxycodone 10 mg 3 times a day #90, 1 months supply,  no early refills   Discussed exercises, emphasis on flexion and avoiding hyper ext Over half of the 25 min visit was spent counseling and coordinating care.

## 2013-07-21 ENCOUNTER — Telehealth: Payer: Self-pay | Admitting: Medical Oncology

## 2013-07-21 NOTE — Telephone Encounter (Signed)
erroneous

## 2013-08-11 ENCOUNTER — Other Ambulatory Visit: Payer: Self-pay | Admitting: *Deleted

## 2013-08-11 MED ORDER — OXYCODONE HCL 10 MG PO TABS: 10.0000 mg | ORAL_TABLET | Freq: Three times a day (TID) | ORAL | Status: DC

## 2013-08-11 NOTE — Telephone Encounter (Signed)
RX printed early for controlled medication for the visit with RN on 08/13/13 (to be signed by MD) 

## 2013-08-13 ENCOUNTER — Encounter: Payer: Self-pay | Admitting: *Deleted

## 2013-08-13 ENCOUNTER — Encounter: Payer: BC Managed Care – PPO | Attending: Physical Medicine & Rehabilitation | Admitting: *Deleted

## 2013-08-13 ENCOUNTER — Telehealth: Payer: Self-pay | Admitting: *Deleted

## 2013-08-13 VITALS — BP 113/70 | HR 113 | Resp 14

## 2013-08-13 DIAGNOSIS — M48061 Spinal stenosis, lumbar region without neurogenic claudication: Secondary | ICD-10-CM | POA: Insufficient documentation

## 2013-08-13 DIAGNOSIS — M961 Postlaminectomy syndrome, not elsewhere classified: Secondary | ICD-10-CM | POA: Insufficient documentation

## 2013-08-13 DIAGNOSIS — M1990 Unspecified osteoarthritis, unspecified site: Secondary | ICD-10-CM

## 2013-08-13 DIAGNOSIS — Z79899 Other long term (current) drug therapy: Secondary | ICD-10-CM | POA: Insufficient documentation

## 2013-08-13 NOTE — Telephone Encounter (Signed)
I will need to examine her prior to med changes, check UDS next visit

## 2013-08-13 NOTE — Telephone Encounter (Signed)
Catherine Craig was in to day for med refill visit.  She is complaining about the small size of the oxycodone and with her arthritis in he hands she has trouble getting the top off without spilling them.  The small size makes it difficult to find.  She would like to know if it can be changed to the oxycodone with acetaminophen because the pills are larger for the next appointment. Please advise

## 2013-08-13 NOTE — Patient Instructions (Signed)
Follow up one month with RN for med refill

## 2013-08-13 NOTE — Progress Notes (Signed)
Here for pill count and medication refills.oxycodone 10 mg #90 Fill date  07/16/13  Today NV#0  She complains about the small size of the oxycodone tablets and with her arthritis she has a tendency to spill them when she pops the top off the pill bottle.  Would like to be switched to percocet because the pills are larger.    VSS   No falls.  Fall risk is moderate.  I have educated and given her a handout on fall prevention in the home.  Refill given of the oxycodone 10 mg #90 and will return in one month for RN med refill.

## 2013-08-16 ENCOUNTER — Telehealth: Payer: Self-pay | Admitting: Physical Medicine & Rehabilitation

## 2013-08-16 NOTE — Telephone Encounter (Signed)
Spoke to patient. States she was suppose to get prescription last time she saw Dr. Letta Pate but he wrote it wrong. Realized it after she got it filled. She just wants a corrected prescription. No appointments available until into April for Dr. Letta Pate. Patient declined the appointment opeing on March 09,2015/hs

## 2013-08-16 NOTE — Telephone Encounter (Signed)
Sent phone note back to clinical pool.

## 2013-08-17 NOTE — Telephone Encounter (Signed)
Ms Catherine Craig is unable to come to an appt with Dr Letta Pate on the 20th which was available.  Her copay is dependent on when she gets her disability?[employer]  She will keep her appt with me on 09/09/13 He will not change her medication without a visit with him.  So her next scheduled appt will need to be with Dr Letta Pate.

## 2013-09-05 ENCOUNTER — Other Ambulatory Visit: Payer: Self-pay | Admitting: *Deleted

## 2013-09-05 MED ORDER — OXYCODONE HCL 10 MG PO TABS: 10.0000 mg | ORAL_TABLET | Freq: Three times a day (TID) | ORAL | Status: DC

## 2013-09-05 NOTE — Telephone Encounter (Signed)
RX printed for MD to sign for RN visit 09/08/13

## 2013-09-08 ENCOUNTER — Encounter: Payer: BC Managed Care – PPO | Attending: Physical Medicine & Rehabilitation | Admitting: *Deleted

## 2013-09-08 ENCOUNTER — Encounter: Payer: Self-pay | Admitting: *Deleted

## 2013-09-08 ENCOUNTER — Other Ambulatory Visit: Payer: Self-pay | Admitting: Physical Medicine & Rehabilitation

## 2013-09-08 DIAGNOSIS — E785 Hyperlipidemia, unspecified: Secondary | ICD-10-CM | POA: Insufficient documentation

## 2013-09-08 DIAGNOSIS — Z981 Arthrodesis status: Secondary | ICD-10-CM | POA: Insufficient documentation

## 2013-09-08 DIAGNOSIS — M79609 Pain in unspecified limb: Secondary | ICD-10-CM | POA: Insufficient documentation

## 2013-09-08 DIAGNOSIS — F17200 Nicotine dependence, unspecified, uncomplicated: Secondary | ICD-10-CM | POA: Insufficient documentation

## 2013-09-08 DIAGNOSIS — K219 Gastro-esophageal reflux disease without esophagitis: Secondary | ICD-10-CM | POA: Insufficient documentation

## 2013-09-08 DIAGNOSIS — Z5181 Encounter for therapeutic drug level monitoring: Secondary | ICD-10-CM

## 2013-09-08 DIAGNOSIS — Z79899 Other long term (current) drug therapy: Secondary | ICD-10-CM

## 2013-09-08 DIAGNOSIS — M545 Low back pain, unspecified: Secondary | ICD-10-CM | POA: Insufficient documentation

## 2013-09-08 DIAGNOSIS — Z9089 Acquired absence of other organs: Secondary | ICD-10-CM | POA: Insufficient documentation

## 2013-09-08 DIAGNOSIS — M961 Postlaminectomy syndrome, not elsewhere classified: Secondary | ICD-10-CM | POA: Insufficient documentation

## 2013-09-08 DIAGNOSIS — I10 Essential (primary) hypertension: Secondary | ICD-10-CM | POA: Insufficient documentation

## 2013-09-08 MED ORDER — TRAMADOL HCL 50 MG PO TABS: 50.0000 mg | ORAL_TABLET | Freq: Four times a day (QID) | ORAL | Status: DC

## 2013-09-08 NOTE — Patient Instructions (Signed)
Follow up one month with RN and 2 month with [facility]

## 2013-09-08 NOTE — Progress Notes (Signed)
Here for pill count and medication refills. Oxycodone 10 mg #90 Fill date 08/13/13 Today NV# 0  A random UDS was collected today.  She says she has been out of her medication for a couple of days.  This is Day #28 of her rx which means she is out at least 2 days early but if she has been out a couple of days then it would be more like 4-5 days early.  Each UDS collected she has been out of her medication and no metabolite present except for initial UDS for which her hydrocodone was last taken the night before and small amount of metabolite present.  This presents a definite red flag.  VSS.  Refill was given for her oxycodone and her tramadol was called to pharmacy.  She is asking about her gabapentin but will have to send a message to Dr Letta Pate to clarify what her dose is supposed to be.  His last visit said she is taking 5-600 per night but the last rx given was for 400 mg.  Will address this with him and the UDS situation. She says that she had a precedure by Dr Rita Ohara (?injection) and that he did not give her any pain medication and said that she had it so to take what she had.  I assume she is indicating this is why she is out early but a back injection should not require additional pain medication. She has not had any falls and was given handout and educated at last visit. She has an Advanced Directive/HCPOA but no copy in chart though one was requested at some point.

## 2013-09-08 NOTE — Telephone Encounter (Signed)
Pharmacy and patient is requesting a refill on her gabapentin.  Your last note said she is taking 500-600mg  of gabapentin but last rx was for 400 mg at hs.  She has had a previous rx for 100 mg tid. Please clarify what you want her to be taking.  I also refilled her tramadol 50 mg qid at her RN med mgmnt appt today.  We collected a UDS but she said she had been out of her oxycodone a few days.  The fill date was 08/13/13 which would make this day #28 of the rx (which is 2 days out early). If out a couple of days then it is more like 4-5 days early.  I looked back and every UDS she has been out of med prior to UDS--last one in October dose taken night before and trace metabolite. She did say she had some procedure with [facility] for which he did not give her additional med but told her to take what she had. This possibly was an epidural and would not really require additional pain med. Please advise about the gabapentin

## 2013-09-09 NOTE — Telephone Encounter (Signed)
Correction gabapentin 500-600 mg each bedtime

## 2013-09-09 NOTE — Telephone Encounter (Signed)
Parents and 100 mg 3 times a day and 300 mg each bedtime

## 2013-09-15 ENCOUNTER — Other Ambulatory Visit: Payer: Self-pay

## 2013-09-15 NOTE — Progress Notes (Signed)
Urine drug screen from 09/08/2013 was consistent.

## 2013-10-01 ENCOUNTER — Telehealth: Payer: Self-pay | Admitting: Internal Medicine

## 2013-10-01 MED ORDER — AMPHETAMINE-DEXTROAMPHETAMINE 10 MG PO TABS: 10.0000 mg | ORAL_TABLET | Freq: Two times a day (BID) | ORAL | Status: DC

## 2013-10-01 NOTE — Telephone Encounter (Signed)
Spoke to pt told her Rx's ready for pickup and I do have samples of Crestor they will be at the front desk. Pt verbalized understanding.

## 2013-10-01 NOTE — Telephone Encounter (Signed)
Pt request 3 mo supply amphetamine-dextroamphetamine (ADDERALL) 10 MG tablet 1 bid Pt also wants samples of crestor 10 mg (5mg  will do )

## 2013-10-08 ENCOUNTER — Ambulatory Visit: Payer: BC Managed Care – PPO | Admitting: Registered Nurse

## 2013-10-14 ENCOUNTER — Encounter: Payer: Self-pay | Admitting: Physical Medicine & Rehabilitation

## 2013-10-14 ENCOUNTER — Ambulatory Visit (HOSPITAL_BASED_OUTPATIENT_CLINIC_OR_DEPARTMENT_OTHER): Payer: BC Managed Care – PPO | Admitting: Physical Medicine & Rehabilitation

## 2013-10-14 ENCOUNTER — Encounter: Payer: BC Managed Care – PPO | Attending: Physical Medicine & Rehabilitation

## 2013-10-14 VITALS — BP 146/88 | HR 103 | Resp 14 | Ht 63.0 in | Wt 172.6 lb

## 2013-10-14 DIAGNOSIS — M48061 Spinal stenosis, lumbar region without neurogenic claudication: Secondary | ICD-10-CM

## 2013-10-14 DIAGNOSIS — M1990 Unspecified osteoarthritis, unspecified site: Secondary | ICD-10-CM | POA: Insufficient documentation

## 2013-10-14 DIAGNOSIS — M961 Postlaminectomy syndrome, not elsewhere classified: Secondary | ICD-10-CM

## 2013-10-14 DIAGNOSIS — Z79899 Other long term (current) drug therapy: Secondary | ICD-10-CM | POA: Insufficient documentation

## 2013-10-14 MED ORDER — OXYCODONE-ACETAMINOPHEN 10-325 MG PO TABS: 1.0000 | ORAL_TABLET | ORAL | Status: DC | PRN

## 2013-10-14 NOTE — Patient Instructions (Signed)
May benefit from bilateral medial branch blocks above the spinal fusion. Lumbar facet joint pain may radiate into the buttocks and the thighs and can mascarade as sciatic pain

## 2013-10-14 NOTE — Progress Notes (Signed)
Subjective:    Patient ID: Catherine Craig, female    DOB: [birth year], 58 y.o.   MRN: [id number] Low back pain and left leg pain. Underwent physical therapy and chiropractic preoperatively. Underwent left L5 transforaminal lumbar epidural injections preoperatively. Had progression of left leg pain and weakness. Underwent neurosurgical evaluation in September of 2013. Underwent L4-5 and L5-S1 decompression and fusion 04/16/2012. Had good postoperative healing. Has had followup appointments with neurosurgery and x-rays demonstrating good placement of hardware.  Continues to have low back pain and this is above the level of the fusion. Left leg pain has improved since the fusion and decompression.  Epidural injection (? Level) December 2014 by neurosurgery, helped the anterior thigh pain on the right side.  Tried increased dose of gabapentin 800 mg causing excessive sedation. 500-600mg  casued some imbalance during th e day  Currently taking 200 -300mg  at night  HPI Did not have corneal surgery yet. intermittent numbness Left leg with standing more than 5 min Doing back exercise  Has had  fall Right elbow and Right hip bruised Constipation with oxycodone Pain Inventory Average Pain 8 Pain Right Now 7 My pain is sharp and stabbing  In the last 24 hours, has pain interfered with the following? General activity 6 Relation with others 7 Enjoyment of life 6 What TIME of day is your pain at its worst? daytime Sleep (in general) Fair  Pain is worse with: walking, bending, sitting, standing and some activites Pain improves with: rest and medication Relief from Meds: 8  Mobility walk with assistance use a cane how many minutes can you walk? 10 ability to climb steps?  yes do you drive?  yes  Function disabled: date disabled . I need assistance with the following:  dressing, bathing, household duties and shopping  Neuro/Psych weakness trouble walking spasms depression  Prior  Studies Any changes since last visit?  yes broken toe on right foot  Physicians involved in your care Any changes since last visit?  no   Family History  Problem Relation Age of Onset  . COPD Neg Hx     family  . Heart disease Neg Hx     family  . ADD / ADHD Neg Hx     family   History   Social History  . Marital Status: Divorced    Spouse Name: N/A    Number of Children: N/A  . Years of Education: N/A   Social History Main Topics  . Smoking status: Current Every Day Smoker -- 1.00 packs/day for 30 years    Types: Cigarettes  . Smokeless tobacco: Never Used     Comment: using electronic cigarette as of 03/2012  . Alcohol Use: Yes     Comment: social  . Drug Use: No  . Sexual Activity: None   Other Topics Concern  . None   Social History Narrative  . None   Past Surgical History  Procedure Laterality Date  . Appendectomy    . Tonsillectomy    . Dilation and curettage of uterus    . Tonsillectomy    . Eye surgery      Lazer to right x 6  . Spine surgery  04/16/2012   Past Medical History  Diagnosis Date  . ADHD 02/10/2007  . ALLERGIC RHINITIS 12/15/2006  . BASAL CELL CARCINOMA, HX OF 07/18/2009  . FIBROMYALGIA 12/15/2006  . GERD 12/15/2006  . HYPERLIPIDEMIA 12/15/2006  . HYPERTENSION 12/15/2006  . MENOPAUSAL SYNDROME 12/15/2006  . OSTEOARTHRITIS 02/11/2008  .  Renal colic 4/69/6295  . VAGINITIS, ATROPHIC 01/05/2007  . Histoplasmosis with retinitis   . Heart murmur     earlier-  not heard now  . Anxiety   . Shortness of breath   . Cancer     left side of nose  . IBS (irritable bowel syndrome)   . Anal fissure   . Thoracic outlet syndrome   . Lipoma     on chest  . Fibromyalgia    BP 146/88  Pulse 103  Resp 14  Ht 5\' 3"  (1.6 m)  Wt 172 lb 9.6 oz (78.291 kg)  BMI 30.58 kg/m2  SpO2 97%  Opioid Risk Score:   Fall Risk Score: Moderate Fall Risk (6-13 points) (educated and handout given at previous vist on fall prevention in the home)  Review of  Systems  Constitutional: Positive for unexpected weight change.  Gastrointestinal: Positive for constipation.  Musculoskeletal: Positive for back pain and gait problem.       Spasms  Neurological: Positive for weakness.  Psychiatric/Behavioral: Positive for dysphoric mood.  All other systems reviewed and are negative.      Objective:   Physical Exam  HENT:  Head: Normocephalic and atraumatic.  Eyes: Conjunctivae and EOM are normal. Pupils are equal, round, and reactive to light.  Neck: Normal range of motion.  Neurological: She is alert and oriented to person, place, and time. She has normal strength. A sensory deficit is present.  Reflex Scores:  Patellar reflexes are 0 on the right side and 2+ on the left side.  Achilles reflexes are 0 on the right side and 1 on the left side.  Negative straight leg   Reduced right L5 pinprick sensation, reduced bilateral L4 pinprick as well as S1 pinprick  Psychiatric: She has a normal mood and affect.  Right PSIS tenderness  Mild pain and mild reduction flexion of lumbar spine  More severe pain with less than 25% of normal extension of the lumbar spine accompanied by posterior thigh pain , full lumbar flexion, 25% lat bending Good hip ROM       Assessment & Plan:  1. Lumbar postlaminectomy syndrome with both mechanical back pain as well as nerve root pain. She would benefit from epidural injection and possibly right sacroiliac injection as she has motion related pain below the level of the L4-S1 fusion.  Her neurosurgeon wanted to be the only physician to do to injections on her spine, as per her report.   Opioid risk tool score 7 moderate risk, monitor closely with pill counts and urine drug screen.    Patient complains at the oxycodone tablets are very small and difficult to handle. Patient states that she spills up to 10 tablets per month to Request switch to Percocet. Discussed Tylenol use and that Percocet does have Tylenol in  it. Oxycodone 10/325 mg 3 times a day #90, 1 months supply, no early refills Return to clinic in 3 weeks with nurse practitioner, assess how the medication is working. If inadequate consider increased to 4 times a day Discussed exercises, emphasis on flexion and avoiding hyper ext  Over half of the 25 min visit was spent counseling and coordinating care.

## 2013-11-04 ENCOUNTER — Encounter: Payer: BC Managed Care – PPO | Attending: Physical Medicine & Rehabilitation | Admitting: Registered Nurse

## 2013-11-04 DIAGNOSIS — E785 Hyperlipidemia, unspecified: Secondary | ICD-10-CM | POA: Insufficient documentation

## 2013-11-04 DIAGNOSIS — M545 Low back pain, unspecified: Secondary | ICD-10-CM | POA: Insufficient documentation

## 2013-11-04 DIAGNOSIS — Z9089 Acquired absence of other organs: Secondary | ICD-10-CM | POA: Insufficient documentation

## 2013-11-04 DIAGNOSIS — M961 Postlaminectomy syndrome, not elsewhere classified: Secondary | ICD-10-CM | POA: Insufficient documentation

## 2013-11-04 DIAGNOSIS — Z981 Arthrodesis status: Secondary | ICD-10-CM | POA: Insufficient documentation

## 2013-11-04 DIAGNOSIS — I10 Essential (primary) hypertension: Secondary | ICD-10-CM | POA: Insufficient documentation

## 2013-11-04 DIAGNOSIS — M79609 Pain in unspecified limb: Secondary | ICD-10-CM | POA: Insufficient documentation

## 2013-11-04 DIAGNOSIS — F17200 Nicotine dependence, unspecified, uncomplicated: Secondary | ICD-10-CM | POA: Insufficient documentation

## 2013-11-04 DIAGNOSIS — K219 Gastro-esophageal reflux disease without esophagitis: Secondary | ICD-10-CM | POA: Insufficient documentation

## 2013-11-06 ENCOUNTER — Other Ambulatory Visit: Payer: Self-pay | Admitting: Physical Medicine & Rehabilitation

## 2013-11-07 ENCOUNTER — Other Ambulatory Visit: Payer: Self-pay | Admitting: Internal Medicine

## 2013-11-10 ENCOUNTER — Other Ambulatory Visit: Payer: Self-pay | Admitting: Internal Medicine

## 2013-11-10 ENCOUNTER — Encounter: Payer: Self-pay | Admitting: Registered Nurse

## 2013-11-10 ENCOUNTER — Encounter (HOSPITAL_BASED_OUTPATIENT_CLINIC_OR_DEPARTMENT_OTHER): Payer: BC Managed Care – PPO | Admitting: Registered Nurse

## 2013-11-10 VITALS — BP 169/92 | HR 103 | Resp 14 | Ht 63.0 in | Wt 176.0 lb

## 2013-11-10 DIAGNOSIS — M961 Postlaminectomy syndrome, not elsewhere classified: Secondary | ICD-10-CM

## 2013-11-10 DIAGNOSIS — M48061 Spinal stenosis, lumbar region without neurogenic claudication: Secondary | ICD-10-CM

## 2013-11-10 DIAGNOSIS — Z5181 Encounter for therapeutic drug level monitoring: Secondary | ICD-10-CM

## 2013-11-10 DIAGNOSIS — Z79899 Other long term (current) drug therapy: Secondary | ICD-10-CM

## 2013-11-10 MED ORDER — OXYCODONE-ACETAMINOPHEN 10-325 MG PO TABS: 1.0000 | ORAL_TABLET | Freq: Four times a day (QID) | ORAL | Status: DC | PRN
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
## 2013-12-03 ENCOUNTER — Other Ambulatory Visit: Payer: Self-pay | Admitting: Neurosurgery

## 2013-12-03 DIAGNOSIS — M48062 Spinal stenosis, lumbar region with neurogenic claudication: Secondary | ICD-10-CM

## 2013-12-06 ENCOUNTER — Ambulatory Visit
Admission: RE | Admit: 2013-12-06 | Discharge: 2013-12-06 | Disposition: A | Discharge status: Home / Self Care | Payer: BC Managed Care – PPO | Source: Ambulatory Visit | Attending: Neurosurgery | Admitting: Neurosurgery

## 2013-12-06 DIAGNOSIS — M48062 Spinal stenosis, lumbar region with neurogenic claudication: Secondary | ICD-10-CM

## 2013-12-08 ENCOUNTER — Other Ambulatory Visit: Payer: Self-pay | Admitting: Neurosurgery

## 2013-12-08 DIAGNOSIS — M48062 Spinal stenosis, lumbar region with neurogenic claudication: Secondary | ICD-10-CM

## 2013-12-09 ENCOUNTER — Encounter: Payer: Self-pay | Admitting: Physical Medicine & Rehabilitation

## 2013-12-09 ENCOUNTER — Other Ambulatory Visit: Payer: Self-pay

## 2013-12-09 ENCOUNTER — Ambulatory Visit (HOSPITAL_BASED_OUTPATIENT_CLINIC_OR_DEPARTMENT_OTHER): Payer: BC Managed Care – PPO | Admitting: Physical Medicine & Rehabilitation

## 2013-12-09 ENCOUNTER — Encounter: Payer: BC Managed Care – PPO | Attending: Physical Medicine & Rehabilitation

## 2013-12-09 VITALS — BP 146/85 | HR 101 | Resp 14 | Ht 63.0 in | Wt 177.2 lb

## 2013-12-09 DIAGNOSIS — Z79899 Other long term (current) drug therapy: Secondary | ICD-10-CM | POA: Insufficient documentation

## 2013-12-09 DIAGNOSIS — M1990 Unspecified osteoarthritis, unspecified site: Secondary | ICD-10-CM | POA: Insufficient documentation

## 2013-12-09 DIAGNOSIS — M48061 Spinal stenosis, lumbar region without neurogenic claudication: Secondary | ICD-10-CM | POA: Insufficient documentation

## 2013-12-09 DIAGNOSIS — M961 Postlaminectomy syndrome, not elsewhere classified: Secondary | ICD-10-CM

## 2013-12-09 DIAGNOSIS — IMO0002 Reserved for concepts with insufficient information to code with codable children: Secondary | ICD-10-CM

## 2013-12-09 DIAGNOSIS — Z5181 Encounter for therapeutic drug level monitoring: Secondary | ICD-10-CM

## 2013-12-09 DIAGNOSIS — M5416 Radiculopathy, lumbar region: Secondary | ICD-10-CM

## 2013-12-09 DIAGNOSIS — M48062 Spinal stenosis, lumbar region with neurogenic claudication: Secondary | ICD-10-CM

## 2013-12-09 MED ORDER — OXYCODONE-ACETAMINOPHEN 10-325 MG PO TABS: 1.0000 | ORAL_TABLET | Freq: Four times a day (QID) | ORAL | Status: DC | PRN

## 2013-12-09 MED ORDER — CYCLOBENZAPRINE HCL 10 MG PO TABS: 10.0000 mg | ORAL_TABLET | Freq: Three times a day (TID) | ORAL | Status: DC | PRN

## 2013-12-09 NOTE — Patient Instructions (Addendum)
Need to bring pill bottles every time  A degenerated L3-L4 disc with spinal stenosis causing neurogenic claudication

## 2013-12-09 NOTE — Progress Notes (Signed)
Subjective:    Patient ID: Catherine Craig, female    DOB: 11-Dec-1955, 58 y.o.   MRN: [id number]  HPI With complaints of increased back pain. Has seen her neurosurgeon. CT of the lumbar spine performed. Results as below. Complains of pain going down from the low back area on the right side through both hips as well as into the ankles  Lumbar flexion relieves the pain. Increased pain with standing and walking. Patient gets muscle spasms when turning in bed at night  in her low back area as well Pain Inventory Average Pain 10 Pain Right Now 9 My pain is sharp, stabbing, aching and like being electrocuted  In the last 24 hours, has pain interfered with the following? General activity 10 Relation with others 10 Enjoyment of life 10 What TIME of day is your pain at its worst? evening and night Sleep (in general) Poor  Pain is worse with: walking, bending, sitting, standing and some activites Pain improves with: medication and lying completely still Relief from Meds: 7  Mobility walk with assistance use a cane how many minutes can you walk? 1-5 ability to climb steps?  yes do you drive?  yes  Function disabled: date disabled 2013 I need assistance with the following:  dressing, bathing, toileting, meal prep, household duties and shopping  Neuro/Psych weakness numbness tingling trouble walking spasms confusion depression  Prior Studies Any changes since last visit?  no CT LUMBAR SPINE WITHOUT CONTRAST  TECHNIQUE:  Multidetector CT imaging of the lumbar spine was performed without  intravenous contrast administration. Multiplanar CT image  reconstructions were also generated.  COMPARISON: 06/08/2013  FINDINGS:  Vertebral body heights are preserved without compression fracture.  Sequelae of prior L4-S1 posterior lumbar interbody fusion are again  identified. Fusion is identified across both postoperative disc  spaces. Fractures noted on the prior study through the  left L4  superior facet and left L4 transverse process are slightly less  conspicuous on the current study. There is mild lumbar levoscoliosis  with slight left lateral listhesis of L3 on L4.  There is mild ectasia of the infrarenal abdominal aorta, with  moderate atherosclerotic calcification noted. Punctate  nonobstructing right renal calculus is noted.  T12-L1: Moderate disc space narrowing and mild endplate spurring  without stenosis, unchanged.  L1-2: Minimal disc bulge without stenosis, unchanged.  L2-3: Minimal disc bulge without stenosis, unchanged.  L3-4: Prominent disc bulge asymmetric to the right with superimposed  central/ right central disc extrusion overall appears mildly  increased in size compared to the prior CT and results in moderate  to severe spinal stenosis, slightly increased. Moderate to severe  right neural foraminal stenosis due to foraminal disc material and  facet arthrosis does not appear significantly changed. Facet and  ligamentum flavum hypertrophy contribute to the spinal stenosis.  L4-5: Prior laminectomies with decompressed spinal canal. No neural  foraminal stenosis identified.  L5-S1: Prior laminectomies with decompressed spinal canal. No neural  foraminal stenosis identified.  IMPRESSION:  1. Mild interval progression of disc degeneration at L3-4 with  slightly increased, moderate to severe spinal stenosis. Moderate to  severe right neural foraminal stenosis is unchanged.  2. Postoperative changes at L4-5 and L5-S1 without evidence of  recurrent stenosis.   Physicians involved in your care Any changes since last visit?  no   Family History  Problem Relation Age of Onset  . COPD Neg Hx     family  . Heart disease Neg Hx     family  .  ADD / ADHD Neg Hx     family   History   Social History  . Marital Status: Divorced    Spouse Name: N/A    Number of Children: N/A  . Years of Education: N/A   Social History Main Topics  . Smoking  status: Current Every Day Smoker -- 1.00 packs/day for 30 years    Types: Cigarettes  . Smokeless tobacco: Never Used     Comment: using electronic cigarette as of 03/2012  . Alcohol Use: Yes     Comment: social  . Drug Use: No  . Sexual Activity: None   Other Topics Concern  . None   Social History Narrative  . None   Past Surgical History  Procedure Laterality Date  . Appendectomy    . Tonsillectomy    . Dilation and curettage of uterus    . Tonsillectomy    . Eye surgery      Lazer to right x 6  . Spine surgery  04/16/2012   Past Medical History  Diagnosis Date  . ADHD 02/10/2007  . ALLERGIC RHINITIS 12/15/2006  . BASAL CELL CARCINOMA, HX OF 07/18/2009  . FIBROMYALGIA 12/15/2006  . GERD 12/15/2006  . HYPERLIPIDEMIA 12/15/2006  . HYPERTENSION 12/15/2006  . MENOPAUSAL SYNDROME 12/15/2006  . OSTEOARTHRITIS 02/11/2008  . Renal colic 5/40/0867  . VAGINITIS, ATROPHIC 01/05/2007  . Histoplasmosis with retinitis   . Heart murmur     earlier-  not heard now  . Anxiety   . Shortness of breath   . Cancer     left side of nose  . IBS (irritable bowel syndrome)   . Anal fissure   . Thoracic outlet syndrome   . Lipoma     on chest  . Fibromyalgia    BP 146/85  Pulse 101  Resp 14  Ht 5\' 3"  (1.6 m)  Wt 177 lb 3.2 oz (80.377 kg)  BMI 31.40 kg/m2  SpO2 95%  Opioid Risk Score:   Fall Risk Score: High Fall Risk (>13 points) (previously educated and given handout on fall prevention inthe home)   Review of Systems  Constitutional: Positive for appetite change.  Gastrointestinal: Positive for nausea and constipation.  Musculoskeletal: Positive for gait problem.       Spasms  Neurological: Positive for weakness and numbness.       Tingling  Psychiatric/Behavioral: Positive for confusion and dysphoric mood.       "thinking that I can't live like this any longer"--not active suicide ideation  All other systems reviewed and are negative.      Objective:   Physical Exam    Nursing note and vitals reviewed. Constitutional: She appears well-developed and well-nourished.  HENT:  Head: Normocephalic and atraumatic.  Eyes: Conjunctivae and EOM are normal. Pupils are equal, round, and reactive to light.  Musculoskeletal:       Lumbar back: She exhibits decreased range of motion and deformity. She exhibits no tenderness.  straight back  Flexion is 50%. Extension is 0%. Lateral bending 25% to left and to right  Positive straight leg raising on the left  Strength is 5/5 bilateral knee extensors ankle dorsiflexors and plantar flexors Hip flexor 4/5 bilateral limited by pain  Neurological:  Reflex Scores:      Patellar reflexes are 2+ on the right side and 2+ on the left side.      Achilles reflexes are 1+ on the right side and 1+ on the left side. Decreased left L4 dermatome sensation  Decreased right L5 and S1 dermatomes sensation  Psychiatric: Her mood appears anxious. Her affect is labile.  Cries easily          Assessment & Plan:  1. Lumbar spinal stenosis with radicular symptoms greater on left than right. L4 level.  In addition she is now developing neurogenic claudication. She is followed up with neurosurgery and a CT of the lumbar spine demonstrated some progression of the adjacent level lumbar spinal stenosis. Patient is quite discouraged. We discussed treatment options including medication adjustment, a course of oral steroids, a trial of epidural steroid injection as well as possible need for repeat decompression and fusion at L3-L4 level  Patient states that oral steroids have not been helpful for her in the past therefore will not try Will recheck your drug screen, increase oxycodone 10 mg from 4 times a day to 5 times per day Continue gabapentin 100 mg 3 times a day and 600 mg each bedtime, does not tolerate higher daytime doses. Schedule for left L4 transforaminal epidural steroid injection under first guidance  Patient also states that  she has an MRI scheduled of her lumbar spine and a followup with neurosurgery

## 2013-12-13 ENCOUNTER — Other Ambulatory Visit: Payer: BC Managed Care – PPO

## 2013-12-16 ENCOUNTER — Ambulatory Visit
Admission: RE | Admit: 2013-12-16 | Discharge: 2013-12-16 | Disposition: A | Discharge status: Home / Self Care | Payer: BC Managed Care – PPO | Source: Ambulatory Visit | Attending: Neurosurgery | Admitting: Neurosurgery

## 2013-12-16 DIAGNOSIS — M48062 Spinal stenosis, lumbar region with neurogenic claudication: Secondary | ICD-10-CM

## 2013-12-16 MED ORDER — GADOBENATE DIMEGLUMINE 529 MG/ML IV SOLN
15.0000 mL | Freq: Once | INTRAVENOUS | Status: AC | PRN
  Administered 2013-12-16: 15 mL via INTRAVENOUS

## 2014-01-01 ENCOUNTER — Other Ambulatory Visit: Payer: Self-pay | Admitting: Physical Medicine & Rehabilitation

## 2014-01-03 ENCOUNTER — Telehealth: Payer: Self-pay | Admitting: *Deleted

## 2014-01-03 MED ORDER — TRAMADOL HCL 50 MG PO TABS: 50.0000 mg | ORAL_TABLET | Freq: Four times a day (QID) | ORAL | Status: DC | PRN

## 2014-01-03 NOTE — Telephone Encounter (Signed)
Called Tramadol refill to pharmacy

## 2014-01-03 NOTE — Telephone Encounter (Signed)
You increased her oxycodone 20 mg from 4 times per day to 5 times per day and did a UDS.   The results are not in the computer yet. No mention of tramadol in note. Do you want to refill the tramadol today?

## 2014-01-07 ENCOUNTER — Ambulatory Visit (INDEPENDENT_AMBULATORY_CARE_PROVIDER_SITE_OTHER): Payer: BC Managed Care – PPO | Admitting: Internal Medicine

## 2014-01-07 ENCOUNTER — Encounter: Payer: Self-pay | Admitting: Internal Medicine

## 2014-01-07 VITALS — BP 150/84 | HR 92 | Temp 98.2°F | Ht 63.0 in | Wt 157.0 lb

## 2014-01-07 DIAGNOSIS — M48061 Spinal stenosis, lumbar region without neurogenic claudication: Secondary | ICD-10-CM

## 2014-01-07 DIAGNOSIS — F17200 Nicotine dependence, unspecified, uncomplicated: Secondary | ICD-10-CM

## 2014-01-07 DIAGNOSIS — M1990 Unspecified osteoarthritis, unspecified site: Secondary | ICD-10-CM

## 2014-01-07 DIAGNOSIS — I10 Essential (primary) hypertension: Secondary | ICD-10-CM

## 2014-01-07 DIAGNOSIS — Z720 Tobacco use: Secondary | ICD-10-CM

## 2014-01-07 DIAGNOSIS — E785 Hyperlipidemia, unspecified: Secondary | ICD-10-CM

## 2014-01-07 LAB — CBC WITH DIFFERENTIAL/PLATELET
BASOS ABS: 0 10*3/uL (ref 0.0–0.1)
Basophils Relative: 0.4 % (ref 0.0–3.0)
Eosinophils Absolute: 0.5 10*3/uL (ref 0.0–0.7)
Eosinophils Relative: 4.1 % (ref 0.0–5.0)
HCT: 41.4 % (ref 36.0–46.0)
HEMOGLOBIN: 14.4 g/dL (ref 12.0–15.0)
Lymphocytes Relative: 19 % (ref 12.0–46.0)
Lymphs Abs: 2.5 10*3/uL (ref 0.7–4.0)
MCHC: 34.8 g/dL (ref 30.0–36.0)
MCV: 93.7 fl (ref 78.0–100.0)
MONOS PCT: 3.7 % (ref 3.0–12.0)
Monocytes Absolute: 0.5 10*3/uL (ref 0.1–1.0)
NEUTROS PCT: 72.8 % (ref 43.0–77.0)
Neutro Abs: 9.6 10*3/uL — ABNORMAL HIGH (ref 1.4–7.7)
Platelets: 247 10*3/uL (ref 150.0–400.0)
RBC: 4.42 Mil/uL (ref 3.87–5.11)
RDW: 14.3 % (ref 11.5–15.5)
WBC: 13.2 10*3/uL — ABNORMAL HIGH (ref 4.0–10.5)

## 2014-01-07 LAB — COMPREHENSIVE METABOLIC PANEL
ALBUMIN: 3.8 g/dL (ref 3.5–5.2)
ALT: 20 U/L (ref 0–35)
AST: 22 U/L (ref 0–37)
Alkaline Phosphatase: 106 U/L (ref 39–117)
BUN: 13 mg/dL (ref 6–23)
CALCIUM: 10.7 mg/dL — AB (ref 8.4–10.5)
CHLORIDE: 102 meq/L (ref 96–112)
CO2: 26 meq/L (ref 19–32)
CREATININE: 1.1 mg/dL (ref 0.4–1.2)
GFR: 54.94 mL/min — AB (ref >60.00)
Glucose, Bld: 95 mg/dL (ref 70–99)
Potassium: 4.3 mEq/L (ref 3.5–5.1)
Sodium: 139 mEq/L (ref 135–145)
Total Bilirubin: 0.5 mg/dL (ref 0.2–1.2)
Total Protein: 6.7 g/dL (ref 6.0–8.3)

## 2014-01-07 LAB — LIPID PANEL
Cholesterol: 207 mg/dL — ABNORMAL HIGH (ref 0–200)
HDL: 38.2 mg/dL — ABNORMAL LOW (ref >39.00)
NONHDL: 168.8
Total CHOL/HDL Ratio: 5
Triglycerides: 368 mg/dL — ABNORMAL HIGH (ref 0.0–149.0)
VLDL: 73.6 mg/dL — ABNORMAL HIGH (ref 0.0–40.0)

## 2014-01-07 LAB — LDL CHOLESTEROL, DIRECT: Direct LDL: 123.4 mg/dL

## 2014-01-07 LAB — TSH: TSH: 2.04 u[IU]/mL (ref 0.35–4.50)

## 2014-01-07 MED ORDER — AMPHETAMINE-DEXTROAMPHETAMINE 10 MG PO TABS: 10.0000 mg | ORAL_TABLET | Freq: Two times a day (BID) | ORAL | Status: DC

## 2014-01-07 NOTE — Progress Notes (Signed)
Subjective:    Patient ID: Catherine Craig, female    DOB: 03-08-1956, 58 y.o.   MRN: [id number]  HPI  58 year old patient who is seen today for followup.  She is followed closely by chronic pain management and neurosurgery  do to  spinal stenosis with neurogenic claudication.  She continues to do poorly with the back pain.  She walks in a stooped position due to the claudication. Medical problems include hypertension, dyslipidemia, and ongoing tobacco use.  She does request referral to [facility].  Neurosurgery- Dr Harl Bowie. Denies any cardiopulmonary complaints, but she is fairly inactive due to her chronic back pain and fibromyalgia.  Past Medical History  Diagnosis Date  . ADHD 02/10/2007  . ALLERGIC RHINITIS 12/15/2006  . BASAL CELL CARCINOMA, HX OF 07/18/2009  . FIBROMYALGIA 12/15/2006  . GERD 12/15/2006  . HYPERLIPIDEMIA 12/15/2006  . HYPERTENSION 12/15/2006  . MENOPAUSAL SYNDROME 12/15/2006  . OSTEOARTHRITIS 02/11/2008  . Renal colic 2/95/1884  . VAGINITIS, ATROPHIC 01/05/2007  . Histoplasmosis with retinitis   . Heart murmur     earlier-  not heard now  . Anxiety   . Shortness of breath   . Cancer     left side of nose  . IBS (irritable bowel syndrome)   . Anal fissure   . Thoracic outlet syndrome   . Lipoma     on chest  . Fibromyalgia     History   Social History  . Marital Status: Married    Spouse Name: N/A    Number of Children: N/A  . Years of Education: N/A   Occupational History  . Not on file.   Social History Main Topics  . Smoking status: Current Every Day Smoker -- 1.00 packs/day for 30 years    Types: Cigarettes  . Smokeless tobacco: Never Used     Comment: using electronic cigarette as of 03/2012  . Alcohol Use: Yes     Comment: social  . Drug Use: No  . Sexual Activity: Not on file   Other Topics Concern  . Not on file   Social History Narrative  . No narrative on file    Past Surgical History  Procedure Laterality Date  . Appendectomy     . Tonsillectomy    . Dilation and curettage of uterus    . Tonsillectomy    . Eye surgery      Lazer to right x 6  . Spine surgery  04/16/2012    Family History  Problem Relation Age of Onset  . COPD Neg Hx     family  . Heart disease Neg Hx     family  . ADD / ADHD Neg Hx     family    Allergies  Allergen Reactions  . Ivp Dye [Iodinated Diagnostic Agents] Hives    Fluroscan eye dye per patient  . Lyrica [Pregabalin] Swelling  . Chantix [Varenicline Tartrate] Nausea And Vomiting    Current Outpatient Prescriptions on File Prior to Visit  Medication Sig Dispense Refill  . aspirin 325 MG tablet Take 325 mg by mouth at bedtime.       Marland Kitchen b complex vitamins tablet Take 1 tablet by mouth at bedtime.      . Calcium Citrate-Vitamin D (CITRACAL + D PO) Take 1 tablet by mouth 2 (two) times daily.      . Coenzyme Q10 (COQ-10) 200 MG CAPS Take 1 tablet by mouth 2 (two) times daily.      . cyclobenzaprine (  FLEXERIL) 10 MG tablet Take 1 tablet (10 mg total) by mouth 3 (three) times daily as needed for muscle spasms.  30 tablet  2  . fexofenadine-pseudoephedrine (ALLEGRA-D) 60-120 MG per tablet Take 1 tablet by mouth daily as needed. For allergies      . furosemide (LASIX) 20 MG tablet Take 20 mg by mouth daily as needed. For swelling      . gabapentin (NEURONTIN) 100 MG capsule TAKE 1 CAPSULE BY MOUTH 3 TIMES DAILY  90 capsule  1  . gabapentin (NEURONTIN) 400 MG capsule TAKE ONE CAPSULE AT BEDTIME AS NEEDED  30 capsule  1  . KRILL OIL PO Take 1 tablet by mouth every morning.      Marland Kitchen MAGNESIUM CITRATE PO Take by mouth.      . Multiple Vitamin (MULTIVITAMIN WITH MINERALS) TABS Take 0.5 tablets by mouth 2 (two) times daily.      . naproxen sodium (ANAPROX) 220 MG tablet Take 220 mg by mouth 2 (two) times daily with a meal.      . nystatin (MYCOSTATIN) 100000 UNIT/ML suspension       . oxyCODONE-acetaminophen (PERCOCET) 10-325 MG per tablet Take 1 tablet by mouth every 6 (six) hours as  needed for pain.  150 tablet  0  . prednisoLONE acetate (PRED FORTE) 1 % ophthalmic suspension       . rosuvastatin (CRESTOR) 10 MG tablet Take 1 tablet (10 mg total) by mouth at bedtime.  90 tablet  3  . traMADol (ULTRAM) 50 MG tablet Take 1 tablet (50 mg total) by mouth every 6 (six) hours as needed.  120 tablet  1  . triamterene-hydrochlorothiazide (MAXZIDE) 75-50 MG per tablet TAKE 1/2 TABLET BY MOUTH EVERY DAY  90 tablet  0  . vitamin C (ASCORBIC ACID) 500 MG tablet Take 500 mg by mouth 2 (two) times daily.      . [DISCONTINUED] fluticasone (FLONASE) 50 MCG/ACT nasal spray Place 1 spray into the nose daily.  16 g  2  . [DISCONTINUED] pravastatin (PRAVACHOL) 40 MG tablet Take 1 tablet (40 mg total) by mouth every evening.  90 tablet  3   No current facility-administered medications on file prior to visit.    BP 150/84  Pulse 92  Temp(Src) 98.2 F (36.8 C) (Oral)  Ht 5\' 3"  (1.6 m)  Wt 157 lb (71.215 kg)  BMI 27.82 kg/m2     Review of Systems  Constitutional: Positive for fatigue.  HENT: Negative for congestion, dental problem, hearing loss, rhinorrhea, sinus pressure, sore throat and tinnitus.   Eyes: Negative for pain, discharge and visual disturbance.  Respiratory: Negative for cough and shortness of breath.   Cardiovascular: Negative for chest pain, palpitations and leg swelling.  Gastrointestinal: Negative for nausea, vomiting, abdominal pain, diarrhea, constipation, blood in stool and abdominal distention.  Genitourinary: Negative for dysuria, urgency, frequency, hematuria, flank pain, vaginal bleeding, vaginal discharge, difficulty urinating, vaginal pain and pelvic pain.  Musculoskeletal: Positive for back pain, gait problem and myalgias. Negative for arthralgias and joint swelling.  Skin: Negative for rash.  Neurological: Negative for dizziness, syncope, speech difficulty, weakness, numbness and headaches.  Hematological: Negative for adenopathy.  Psychiatric/Behavioral:  Negative for behavioral problems, dysphoric mood and agitation. The patient is not nervous/anxious.        Objective:   Physical Exam  Constitutional: She is oriented to person, place, and time. She appears well-developed and well-nourished.  Walks and S2 position Repeat blood pressure 140/80  HENT:  Head:  Normocephalic.  Right Ear: External ear normal.  Left Ear: External ear normal.  Mouth/Throat: Oropharynx is clear and moist.  Eyes: Conjunctivae and EOM are normal. Pupils are equal, round, and reactive to light.  Neck: Normal range of motion. Neck supple. No thyromegaly present.  Cardiovascular: Normal rate, regular rhythm, normal heart sounds and intact distal pulses.   Pulmonary/Chest: Effort normal and breath sounds normal.  Abdominal: Soft. Bowel sounds are normal. She exhibits no mass. There is no tenderness.  Musculoskeletal: Normal range of motion.  Lymphadenopathy:    She has no cervical adenopathy.  Neurological: She is alert and oriented to person, place, and time.  Skin: Skin is warm and dry. No rash noted.  Psychiatric: She has a normal mood and affect. Her behavior is normal.          Assessment & Plan:   Spinal stenosis with neurogenic claudication.  Medical records copied.  Referral made Hypertension ADHD.  Adderall refilled Chronic pain Dyslipidemia.  We'll check a lipid profile  Screening labs will be reviewed

## 2014-01-07 NOTE — Patient Instructions (Signed)
Limit your sodium (Salt) intake    It is important that you exercise regularly, at least 20 minutes 3 to 4 times per week.  If you develop chest pain or shortness of breath seek  medical attention.  Return in 6 months for follow-up  

## 2014-01-07 NOTE — Progress Notes (Signed)
Pre visit review using our clinic review tool, if applicable. No additional management support is needed unless otherwise documented below in the visit note. 

## 2014-01-11 ENCOUNTER — Encounter: Payer: Self-pay | Admitting: Registered Nurse

## 2014-01-11 ENCOUNTER — Encounter: Payer: BC Managed Care – PPO | Attending: Physical Medicine & Rehabilitation | Admitting: Registered Nurse

## 2014-01-11 VITALS — BP 148/94 | HR 113 | Resp 14 | Wt 173.6 lb

## 2014-01-11 DIAGNOSIS — F17200 Nicotine dependence, unspecified, uncomplicated: Secondary | ICD-10-CM | POA: Insufficient documentation

## 2014-01-11 DIAGNOSIS — IMO0002 Reserved for concepts with insufficient information to code with codable children: Secondary | ICD-10-CM

## 2014-01-11 DIAGNOSIS — E785 Hyperlipidemia, unspecified: Secondary | ICD-10-CM | POA: Insufficient documentation

## 2014-01-11 DIAGNOSIS — M79609 Pain in unspecified limb: Secondary | ICD-10-CM | POA: Insufficient documentation

## 2014-01-11 DIAGNOSIS — M48062 Spinal stenosis, lumbar region with neurogenic claudication: Secondary | ICD-10-CM

## 2014-01-11 DIAGNOSIS — M961 Postlaminectomy syndrome, not elsewhere classified: Secondary | ICD-10-CM | POA: Insufficient documentation

## 2014-01-11 DIAGNOSIS — M545 Low back pain, unspecified: Secondary | ICD-10-CM | POA: Insufficient documentation

## 2014-01-11 DIAGNOSIS — Z981 Arthrodesis status: Secondary | ICD-10-CM | POA: Insufficient documentation

## 2014-01-11 DIAGNOSIS — Z5181 Encounter for therapeutic drug level monitoring: Secondary | ICD-10-CM

## 2014-01-11 DIAGNOSIS — Z9089 Acquired absence of other organs: Secondary | ICD-10-CM | POA: Insufficient documentation

## 2014-01-11 DIAGNOSIS — I10 Essential (primary) hypertension: Secondary | ICD-10-CM | POA: Insufficient documentation

## 2014-01-11 DIAGNOSIS — Z79899 Other long term (current) drug therapy: Secondary | ICD-10-CM

## 2014-01-11 DIAGNOSIS — M5416 Radiculopathy, lumbar region: Secondary | ICD-10-CM

## 2014-01-11 DIAGNOSIS — K219 Gastro-esophageal reflux disease without esophagitis: Secondary | ICD-10-CM | POA: Insufficient documentation

## 2014-01-11 MED ORDER — OXYCODONE-ACETAMINOPHEN 10-325 MG PO TABS: 1.0000 | ORAL_TABLET | Freq: Four times a day (QID) | ORAL | Status: DC | PRN

## 2014-01-11 NOTE — Progress Notes (Signed)
Subjective:    Patient ID: Catherine Craig, female    DOB: Apr 06, 1956, 58 y.o.   MRN: [id number]  HPI: [name] is a 58 year old female who returns for follow up for chronic pain and medication refill. She says her pain is located in her lower back, bilateral buttocks and bilateral lower extremities posteriorly. She rates her pain 8. Her current exercise regime is performing stretching exercises and walking her dogs for short distances twice a day. She says she seen her neurosurgeon Dr. Rita Ohara two weeks ago he ordered a MRI and she was told she needed more surgery. She is in the process of obtaining a second opinion at [facility]:  Multiplanar and multiecho pulse sequences of the lumbar spine were  obtained without and with intravenous contrast.  CONTRAST: 19mL MULTIHANCE GADOBENATE DIMEGLUMINE 529 MG/ML IV SOLN  COMPARISON: Lumbar spine CT 12/06/2013. MRI of the lumbar spine  without and with contrast 06/07/2013.  FINDINGS:  Patient is status post lumbar fusion at L4-5 and L5-S1. Pedicle  screw and rod fixation is intact. Normal signal is present in the  conus medullaris which terminates at L2, within normal limits.  Exaggerated kyphosis and endplate marrow changes than the lower  thoracic spine are noted from T10 through L1.  Limited imaging of the abdomen is unremarkable.  T12-L1: Mild facet hypertrophy is present. There is no significant  stenosis.  L1-2: Negative.  L2-3: Asymmetric left-sided facet hypertrophy is noted. There is no  significant stenosis.  L3-4: A broad-based disc protrusion has progressed. Moderate central  canal stenosis is evident. Moderate foraminal stenosis is slightly  worse than on the prior exam.  L4-5: The patient is status post PLIF. No residual or recurrent  stenosis is evident.  L5-S1: The patient is status post PLIF. No residual or recurrent  stenosis is present.  IMPRESSION:  1.  Progressive broad-based disc protrusion and facet hypertrophy at  L3-4 with worsening moderate central canal stenosis.  2. Moderate foraminal stenosis is slightly worse than on the prior  exam at L3-4.  3. Stable postoperative changes at L4-5 and L5-S1 without residual  or recurrent stenosis at these levels.  4. Mild facet hypertrophy at T12-L1 and L2-3 without significant  disc protrusions or stenosis.  5. Exaggerated kyphosis with chronic endplate marrow changes in the  lower thoracic spine are stable.   Pain Inventory Average Pain 8 Pain Right Now 8 My pain is sharp, stabbing and aching  In the last 24 hours, has pain interfered with the following? General activity 8 Relation with others 9 Enjoyment of life 9 What TIME of day is your pain at its worst? all Sleep (in general) Poor  Pain is worse with: walking, bending and sitting Pain improves with: rest, therapy/exercise, pacing activities and medication Relief from Meds: 6  Mobility walk with assistance use a cane use a walker how many minutes can you walk? 5 ability to climb steps?  yes do you drive?  yes  Function disabled: date disabled . I need assistance with the following:  dressing, toileting, meal prep, household duties and shopping  Neuro/Psych bowel control problems weakness numbness tingling trouble walking spasms depression anxiety  Prior Studies Any changes since last visit?  no  Physicians involved in your care Any changes since last visit?  no   Family History  Problem Relation Age of Onset  . COPD Neg Hx     family  . Heart  disease Neg Hx     family  . ADD / ADHD Neg Hx     family   History   Social History  . Marital Status: Married    Spouse Name: N/A    Number of Children: N/A  . Years of Education: N/A   Social History Main Topics  . Smoking status: Current Every Day Smoker -- 1.00 packs/day for 30 years    Types: Cigarettes  . Smokeless tobacco: Never Used      Comment: using electronic cigarette as of 03/2012  . Alcohol Use: Yes     Comment: social  . Drug Use: No  . Sexual Activity: None   Other Topics Concern  . None   Social History Narrative  . None   Past Surgical History  Procedure Laterality Date  . Appendectomy    . Tonsillectomy    . Dilation and curettage of uterus    . Tonsillectomy    . Eye surgery      Lazer to right x 6  . Spine surgery  04/16/2012   Past Medical History  Diagnosis Date  . ADHD 02/10/2007  . ALLERGIC RHINITIS 12/15/2006  . BASAL CELL CARCINOMA, HX OF 07/18/2009  . FIBROMYALGIA 12/15/2006  . GERD 12/15/2006  . HYPERLIPIDEMIA 12/15/2006  . HYPERTENSION 12/15/2006  . MENOPAUSAL SYNDROME 12/15/2006  . OSTEOARTHRITIS 02/11/2008  . Renal colic 09/07/5571  . VAGINITIS, ATROPHIC 01/05/2007  . Histoplasmosis with retinitis   . Heart murmur     earlier-  not heard now  . Anxiety   . Shortness of breath   . Cancer     left side of nose  . IBS (irritable bowel syndrome)   . Anal fissure   . Thoracic outlet syndrome   . Lipoma     on chest  . Fibromyalgia    BP 148/94  Pulse 113  Resp 14  Wt 173 lb 9.6 oz (78.744 kg)  SpO2 98%  Opioid Risk Score:   Fall Risk Score: Moderate Fall Risk (6-13 points) (previously educated and given handout for fall prevention) Review of Systems  Gastrointestinal:       Bowel Control Problems  Musculoskeletal: Positive for back pain and gait problem.       Spasms  Neurological: Positive for weakness and numbness.  Psychiatric/Behavioral: Positive for dysphoric mood. The patient is nervous/anxious.   All other systems reviewed and are negative.      Objective:   Physical Exam  Nursing note and vitals reviewed. Constitutional: She appears well-developed and well-nourished.  HENT:  Head: Normocephalic and atraumatic.  Neck: Normal range of motion. Neck supple.  Cardiovascular: Normal rate, regular rhythm and normal heart sounds.   Pulmonary/Chest: Effort normal and  breath sounds normal.  Musculoskeletal:  Normal Muscle Bulk and Muscle Testing Reveals: Upper Extremities: Full ROM and Muscle Strength 5/5 Back without Spinal or Paraspinal Tenderness Spinal Forward Flexion 90 Degrees ahe says she is unable to extend. Lower Extremities: Full ROM and Muscle strength 5/5 Bilateral Flexion Produces pain into Buttocks and Posteriorly in Lower Extremities She walks with a cane Wide Based Gait/ Antalgic  Neurological: She is alert.  Skin: Skin is warm and dry.  Psychiatric: She has a normal mood and affect.          Assessment & Plan:  1. Lumbar postlaminectomy syndrome with both mechanical back pain as well as nerve root pain.  Refilled: Oxycodone 10/325mg  one tablet every 6 hours as needed #150. She was out of  her medications due to her scheduled appointment . She will be called in for a pill count prior to her next appointment. She is scheduled for ESI with Dr. Letta Pate on 02/10/14.  20 minutes of face to face patient care time was spent during this visit. All questions were encouraged and answered.   F/U in 1 month

## 2014-01-30 ENCOUNTER — Other Ambulatory Visit: Payer: Self-pay | Admitting: Physical Medicine & Rehabilitation

## 2014-02-10 ENCOUNTER — Encounter: Payer: BC Managed Care – PPO | Attending: Physical Medicine & Rehabilitation

## 2014-02-10 ENCOUNTER — Ambulatory Visit: Payer: BC Managed Care – PPO | Admitting: Physical Medicine & Rehabilitation

## 2014-02-10 DIAGNOSIS — Z79899 Other long term (current) drug therapy: Secondary | ICD-10-CM | POA: Insufficient documentation

## 2014-02-10 DIAGNOSIS — M1990 Unspecified osteoarthritis, unspecified site: Secondary | ICD-10-CM | POA: Insufficient documentation

## 2014-02-10 DIAGNOSIS — M961 Postlaminectomy syndrome, not elsewhere classified: Secondary | ICD-10-CM | POA: Insufficient documentation

## 2014-02-10 DIAGNOSIS — M48061 Spinal stenosis, lumbar region without neurogenic claudication: Secondary | ICD-10-CM | POA: Insufficient documentation

## 2014-02-14 ENCOUNTER — Encounter: Payer: Self-pay | Admitting: Physical Medicine & Rehabilitation

## 2014-02-14 ENCOUNTER — Ambulatory Visit (HOSPITAL_BASED_OUTPATIENT_CLINIC_OR_DEPARTMENT_OTHER): Payer: BC Managed Care – PPO | Admitting: Physical Medicine & Rehabilitation

## 2014-02-14 VITALS — BP 131/78 | HR 118 | Resp 14 | Ht 63.0 in | Wt 173.0 lb

## 2014-02-14 DIAGNOSIS — M48061 Spinal stenosis, lumbar region without neurogenic claudication: Secondary | ICD-10-CM | POA: Diagnosis present

## 2014-02-14 DIAGNOSIS — M1990 Unspecified osteoarthritis, unspecified site: Secondary | ICD-10-CM | POA: Diagnosis not present

## 2014-02-14 DIAGNOSIS — Z79899 Other long term (current) drug therapy: Secondary | ICD-10-CM | POA: Diagnosis not present

## 2014-02-14 DIAGNOSIS — M961 Postlaminectomy syndrome, not elsewhere classified: Secondary | ICD-10-CM

## 2014-02-14 MED ORDER — GABAPENTIN 100 MG PO CAPS: ORAL_CAPSULE | ORAL | Status: DC

## 2014-02-14 MED ORDER — OXYCODONE-ACETAMINOPHEN 10-325 MG PO TABS: 1.0000 | ORAL_TABLET | Freq: Four times a day (QID) | ORAL | Status: DC | PRN

## 2014-02-14 MED ORDER — CYCLOBENZAPRINE HCL 10 MG PO TABS: 10.0000 mg | ORAL_TABLET | Freq: Three times a day (TID) | ORAL | Status: DC | PRN

## 2014-02-14 NOTE — Progress Notes (Signed)
Subjective:    Patient ID: Catherine Craig, female    DOB: August 20, 1955, 58 y.o.   MRN: [id number]  HPI Patient has been complaining of increasing low back pain since I saw her in June of 2015. Has undergone MRI of the lumbar spine demonstrating increased degeneration at L3-L4 which is above the level of her fusion. She saw her neurosurgeon who recommended fusion at this level. She went to [facility] to get a second opinion. Saw physical medicine physician at the [facility]. Simple laminectomy was not recommended secondary to multiple areas of pain. EMG testing was performed. The report is not in electronic medical record at this point. The patient states that the doctor couldn't tell her if there was any nerve problem on the right side. It sounded like some chronic nerve problems on left side.  CC  Back pain Left groin pain spreading into the leg  Also has pain in Right thigh with numbness.  Not tolerating gabapentin at higher doses  PSH:  L4-5, L5-S1 fusion October 2013  Pain Inventory Average Pain 7 Pain Right Now 6 My pain is intermittent, sharp, burning, stabbing and aching  In the last 24 hours, has pain interfered with the following? General activity 8 Relation with others 10 Enjoyment of life 10 What TIME of day is your pain at its worst? constant all day Sleep (in general) Poor  Pain is worse with: walking, bending, sitting and standing Pain improves with: rest and medication Relief from Meds: 7  Mobility walk with assistance use a cane how many minutes can you walk? 5 ability to climb steps?  yes do you drive?  yes use a wheelchair Do you have any goals in this area?  yes  Function disabled: date disabled 06/2011 I need assistance with the following:  dressing, bathing, household duties and shopping  Neuro/Psych weakness numbness tingling trouble walking spasms depression  Prior Studies Any changes since last visit?  yes nerve study  Physicians  involved in your care Any changes since last visit?  no   Family History  Problem Relation Age of Onset  . COPD Neg Hx     family  . Heart disease Neg Hx     family  . ADD / ADHD Neg Hx     family   History   Social History  . Marital Status: Married    Spouse Name: N/A    Number of Children: N/A  . Years of Education: N/A   Social History Main Topics  . Smoking status: Current Every Day Smoker -- 1.00 packs/day for 30 years    Types: Cigarettes  . Smokeless tobacco: Never Used     Comment: using electronic cigarette as of 03/2012  . Alcohol Use: Yes     Comment: social  . Drug Use: No  . Sexual Activity: None   Other Topics Concern  . None   Social History Narrative  . None   Past Surgical History  Procedure Laterality Date  . Appendectomy    . Tonsillectomy    . Dilation and curettage of uterus    . Tonsillectomy    . Eye surgery      Lazer to right x 6  . Spine surgery  04/16/2012   Past Medical History  Diagnosis Date  . ADHD 02/10/2007  . ALLERGIC RHINITIS 12/15/2006  . BASAL CELL CARCINOMA, HX OF 07/18/2009  . FIBROMYALGIA 12/15/2006  . GERD 12/15/2006  . HYPERLIPIDEMIA 12/15/2006  . HYPERTENSION 12/15/2006  .  MENOPAUSAL SYNDROME 12/15/2006  . OSTEOARTHRITIS 02/11/2008  . Renal colic 10/11/621  . VAGINITIS, ATROPHIC 01/05/2007  . Histoplasmosis with retinitis   . Heart murmur     earlier-  not heard now  . Anxiety   . Shortness of breath   . Cancer     left side of nose  . IBS (irritable bowel syndrome)   . Anal fissure   . Thoracic outlet syndrome   . Lipoma     on chest  . Fibromyalgia    BP 131/78  Pulse 118  Resp 14  Ht 5\' 3"  (1.6 m)  Wt 173 lb (78.472 kg)  BMI 30.65 kg/m2  SpO2 98%  Opioid Risk Score:   Fall Risk Score: Moderate Fall Risk (6-13 points) (pt educated, declined handout)    Review of Systems  Musculoskeletal: Positive for back pain and gait problem.  Neurological: Positive for weakness and numbness.       Tingling,  spasms  Psychiatric/Behavioral:       Depression  All other systems reviewed and are negative.      Objective:   Physical Exam  Constitutional: She is oriented to person, place, and time.  Neurological: She is alert and oriented to person, place, and time.  Reflex Scores:      Patellar reflexes are 1+ on the right side and 2+ on the left side.      Achilles reflexes are 1+ on the right side and 1+ on the left side. No pinprick numbness in L3  Musculoskeletal:  Lumbar back: She exhibits decreased range of motion and deformity. She exhibits no tenderness.  straight back  Flexion is 50%. Extension is 0%. Lateral bending 25% to left and to right   negative straight leg raising on the left  Strength is 5/5 bilateral knee extensors ankle dorsiflexors and plantar flexors Hip flexor 4/5 bilateral limited by pain  Neurological:  Reflex Scores:  Patellar reflexes are 2+ on the right side and 2+ on the left side.  Achilles reflexes are 1+ on the right side and 1+ on the left side.    Decrease left L4 and S1 dermatomal sensation to pinprick      Assessment & Plan:  1.  Lumbar post laminectomy- chronic pain lumbar axial and radicular Has no focal neurologic signs other than pinprick sensation abnormalities She has had epidural steroid injections about 2 months ago in about 6 months ago. She thinks that one of them were transforaminal. Will request these records so we know how to proceed. Ultimately she will require repeat surgery but is trying to get  some short term relief in the meantime  We discussed spinal cord stimulation may be helpful to relieve pain but will not address the disc degeneration directly  She gets only partial relief with oxycodone 10 mg 5 times per day, she does get some relief with gabapentin however can only tolerate 100 mg 3 times per day, cannot tolerate Lyrica secondary to swelling Not a good candidate for Cymbalta secondary to tramadol 4 times a  day  Discussed other antidepressant support her mood only other option IC would be Wellbutrin which she has tried for smoking cessation and states it really didn't feel like it help anything with her mood  Over half of the 25 min visit was spent counseling and coordinating care.

## 2014-02-14 NOTE — Patient Instructions (Signed)
Cannot use most antidepressants with tramadol, may try wellbutrin  Spinal cord stimulation a possibility for pain relief but will not change the disc problem  Our office fax is  [phone]

## 2014-02-28 ENCOUNTER — Ambulatory Visit: Payer: BC Managed Care – PPO | Admitting: Physical Medicine & Rehabilitation

## 2014-03-02 ENCOUNTER — Other Ambulatory Visit: Payer: Self-pay | Admitting: Physical Medicine & Rehabilitation

## 2014-03-08 ENCOUNTER — Encounter: Payer: BC Managed Care – PPO | Attending: Physical Medicine & Rehabilitation

## 2014-03-08 ENCOUNTER — Ambulatory Visit: Payer: BC Managed Care – PPO | Admitting: Physical Medicine & Rehabilitation

## 2014-03-08 DIAGNOSIS — Z79899 Other long term (current) drug therapy: Secondary | ICD-10-CM | POA: Insufficient documentation

## 2014-03-08 DIAGNOSIS — M48061 Spinal stenosis, lumbar region without neurogenic claudication: Secondary | ICD-10-CM

## 2014-03-08 DIAGNOSIS — M1990 Unspecified osteoarthritis, unspecified site: Secondary | ICD-10-CM | POA: Insufficient documentation

## 2014-03-08 DIAGNOSIS — M961 Postlaminectomy syndrome, not elsewhere classified: Secondary | ICD-10-CM | POA: Insufficient documentation

## 2014-03-08 MED ORDER — OXYCODONE-ACETAMINOPHEN 10-325 MG PO TABS: 1.0000 | ORAL_TABLET | Freq: Four times a day (QID) | ORAL | Status: DC | PRN

## 2014-03-08 NOTE — Progress Notes (Signed)
No MD visit done today.  Catherine Craig is again out of her medication early.  She should have #37 tablets today and she has #0.  She has consistently been out of her medication early.  For this reason she is being discharged from clinic.  A wean down dose of percocet was given and her discharge letter with list of area pain clinics.  She was advised to take her medication as prescribed in the future to avoid this situation at future MD offices.

## 2014-04-14 ENCOUNTER — Telehealth: Payer: Self-pay | Admitting: Internal Medicine

## 2014-04-14 NOTE — Telephone Encounter (Signed)
Pt called back and gave her the message

## 2014-04-14 NOTE — Telephone Encounter (Addendum)
Lm on vm on husband's phone.  Only lm for pt to cb. Nothing else. [facility] nurse , Edwinna Areola at [phone] like pt to call her concerning upcoming surgery

## 2014-04-16 ENCOUNTER — Other Ambulatory Visit: Payer: Self-pay | Admitting: Physical Medicine & Rehabilitation

## 2014-04-19 ENCOUNTER — Ambulatory Visit (INDEPENDENT_AMBULATORY_CARE_PROVIDER_SITE_OTHER): Payer: BC Managed Care – PPO | Admitting: Internal Medicine

## 2014-04-19 ENCOUNTER — Encounter: Payer: Self-pay | Admitting: Internal Medicine

## 2014-04-19 VITALS — BP 150/100 | HR 102 | Temp 98.1°F | Resp 20 | Ht 63.0 in | Wt 171.0 lb

## 2014-04-19 DIAGNOSIS — Z23 Encounter for immunization: Secondary | ICD-10-CM

## 2014-04-19 DIAGNOSIS — I10 Essential (primary) hypertension: Secondary | ICD-10-CM

## 2014-04-19 DIAGNOSIS — E785 Hyperlipidemia, unspecified: Secondary | ICD-10-CM

## 2014-04-19 DIAGNOSIS — Z720 Tobacco use: Secondary | ICD-10-CM

## 2014-04-19 MED ORDER — ATORVASTATIN CALCIUM 40 MG PO TABS: 40.0000 mg | ORAL_TABLET | Freq: Every day | ORAL | Status: DC

## 2014-04-19 MED ORDER — GABAPENTIN 100 MG PO CAPS: ORAL_CAPSULE | ORAL | Status: DC

## 2014-04-19 MED ORDER — TRAMADOL HCL 50 MG PO TABS: 50.0000 mg | ORAL_TABLET | Freq: Four times a day (QID) | ORAL | Status: DC | PRN

## 2014-04-19 NOTE — Patient Instructions (Signed)
Limit your sodium (Salt) intake  Please check your blood pressure on a regular basis.  If it is consistently greater than 150/90, please make an office appointment.  Smoking tobacco is very bad for your health. You should stop smoking immediately.  Return in 3 months for follow-up  

## 2014-04-19 NOTE — Progress Notes (Signed)
Pre visit review using our clinic review tool, if applicable. No additional management support is needed unless otherwise documented below in the visit note. 

## 2014-04-19 NOTE — Progress Notes (Signed)
Subjective:    Patient ID: Catherine Craig, female    DOB: 1956-06-15, 58 y.o.   MRN: [id number]  HPI 58 year old patient who is seen today preoperatively for repeat lumbar surgery.she has had persistent lumbar pain following prior surgery for spinal stenosis. Otherwise, doing quite well.  She has decreased her smoking consumption somewhat and is down to 1 half pack per day.  No exertional chest pain.  Her pulmonary status has been stable. She has treated hypertension. She is off narcotics and presently is on Neurontin and tramadol Surgery is scheduled in 10 days.  She does plan to discontinue anti-inflammatories 7 days preoperatively. She has been on Crestor for dyslipidemia and objects to the cost.  She had been on atorvastatin.  Prior but switch due to inadequate response on 80 mg.  Presently she is on 5 mg of Crestor daily due to cost considerations.  She did have a lipid panel performed in July  Past Medical History  Diagnosis Date  . ADHD 02/10/2007  . ALLERGIC RHINITIS 12/15/2006  . BASAL CELL CARCINOMA, HX OF 07/18/2009  . FIBROMYALGIA 12/15/2006  . GERD 12/15/2006  . HYPERLIPIDEMIA 12/15/2006  . HYPERTENSION 12/15/2006  . MENOPAUSAL SYNDROME 12/15/2006  . OSTEOARTHRITIS 02/11/2008  . Renal colic 6/83/4196  . VAGINITIS, ATROPHIC 01/05/2007  . Histoplasmosis with retinitis   . Heart murmur     earlier-  not heard now  . Anxiety   . Shortness of breath   . Cancer     left side of nose  . IBS (irritable bowel syndrome)   . Anal fissure   . Thoracic outlet syndrome   . Lipoma     on chest  . Fibromyalgia     History   Social History  . Marital Status: Married    Spouse Name: N/A    Number of Children: N/A  . Years of Education: N/A   Occupational History  . Not on file.   Social History Main Topics  . Smoking status: Current Every Day Smoker -- 1.00 packs/day for 30 years    Types: Cigarettes  . Smokeless tobacco: Never Used     Comment: using electronic cigarette as  of 03/2012  . Alcohol Use: Yes     Comment: social  . Drug Use: No  . Sexual Activity: Not on file   Other Topics Concern  . Not on file   Social History Narrative    Past Surgical History  Procedure Laterality Date  . Appendectomy    . Tonsillectomy    . Dilation and curettage of uterus    . Tonsillectomy    . Eye surgery      Lazer to right x 6  . Spine surgery  04/16/2012    Family History  Problem Relation Age of Onset  . COPD Neg Hx     family  . Heart disease Neg Hx     family  . ADD / ADHD Neg Hx     family    Allergies  Allergen Reactions  . Ivp Dye [Iodinated Diagnostic Agents] Hives    Fluroscan eye dye per patient  . Lyrica [Pregabalin] Swelling  . Chantix [Varenicline Tartrate] Nausea And Vomiting    Current Outpatient Prescriptions on File Prior to Visit  Medication Sig Dispense Refill  . amphetamine-dextroamphetamine (ADDERALL) 10 MG tablet Take 1 tablet (10 mg total) by mouth 2 (two) times daily. 60 tablet 0  . aspirin 325 MG tablet Take 325 mg by mouth at bedtime.     Marland Kitchen  b complex vitamins tablet Take 1 tablet by mouth at bedtime.    . Calcium Citrate-Vitamin D (CITRACAL + D PO) Take 1 tablet by mouth 2 (two) times daily.    . Coenzyme Q10 (COQ-10) 200 MG CAPS Take 1 tablet by mouth 2 (two) times daily.    . cyclobenzaprine (FLEXERIL) 10 MG tablet Take 1 tablet (10 mg total) by mouth 3 (three) times daily as needed for muscle spasms. 90 tablet 2  . fexofenadine-pseudoephedrine (ALLEGRA-D) 60-120 MG per tablet Take 1 tablet by mouth daily as needed. For allergies    . furosemide (LASIX) 20 MG tablet Take 20 mg by mouth daily as needed. For swelling    . gabapentin (NEURONTIN) 100 MG capsule TAKE 1 CAPSULE BY MOUTH 3 TIMES DAILY 90 capsule 1  . gabapentin (NEURONTIN) 400 MG capsule TAKE ONE CAPSULE AT BEDTIME AS NEEDED 30 capsule 1  . KRILL OIL PO Take 1 tablet by mouth every morning.    Marland Kitchen MAGNESIUM CITRATE PO Take by mouth.    . Multiple Vitamin  (MULTIVITAMIN WITH MINERALS) TABS Take 0.5 tablets by mouth 2 (two) times daily.    . naproxen sodium (ANAPROX) 220 MG tablet Take 220 mg by mouth 2 (two) times daily with a meal.    . nystatin (MYCOSTATIN) 100000 UNIT/ML suspension     . prednisoLONE acetate (PRED FORTE) 1 % ophthalmic suspension Place 1 drop into the left eye 3 (three) times daily.     . traMADol (ULTRAM) 50 MG tablet TAKE 1 TABLET BY MOUTH EVERY 6 HOURS AS NEEDED FOR PAIN 120 tablet 1  . triamterene-hydrochlorothiazide (MAXZIDE) 75-50 MG per tablet TAKE 1/2 TABLET BY MOUTH EVERY DAY 90 tablet 0  . vitamin C (ASCORBIC ACID) 500 MG tablet Take 500 mg by mouth 2 (two) times daily.    . rosuvastatin (CRESTOR) 10 MG tablet Take 1 tablet (10 mg total) by mouth at bedtime. 90 tablet 3  . [DISCONTINUED] fluticasone (FLONASE) 50 MCG/ACT nasal spray Place 1 spray into the nose daily. 16 g 2  . [DISCONTINUED] pravastatin (PRAVACHOL) 40 MG tablet Take 1 tablet (40 mg total) by mouth every evening. 90 tablet 3   No current facility-administered medications on file prior to visit.    BP 150/100 mmHg  Pulse 102  Temp(Src) 98.1 F (36.7 C) (Oral)  Resp 20  Ht 5\' 3"  (1.6 m)  Wt 171 lb (77.565 kg)  BMI 30.30 kg/m2  SpO2 98%      Review of Systems  Constitutional: Negative.   HENT: Negative for congestion, dental problem, hearing loss, rhinorrhea, sinus pressure, sore throat and tinnitus.   Eyes: Negative for pain, discharge and visual disturbance.  Respiratory: Negative for cough and shortness of breath.   Cardiovascular: Negative for chest pain, palpitations and leg swelling.  Gastrointestinal: Negative for nausea, vomiting, abdominal pain, diarrhea, constipation, blood in stool and abdominal distention.  Genitourinary: Negative for dysuria, urgency, frequency, hematuria, flank pain, vaginal bleeding, vaginal discharge, difficulty urinating, vaginal pain and pelvic pain.  Musculoskeletal: Positive for back pain. Negative for  joint swelling, arthralgias and gait problem.  Skin: Negative for rash.  Neurological: Negative for dizziness, syncope, speech difficulty, weakness, numbness and headaches.  Hematological: Negative for adenopathy.  Psychiatric/Behavioral: Negative for behavioral problems, dysphoric mood and agitation. The patient is not nervous/anxious.        Objective:   Physical Exam  Constitutional: She is oriented to person, place, and time. She appears well-developed and well-nourished.  Able to stand  easily from a sitting position and ambulated to the examining table Walks with a slightly stooped posture Repeat blood pressure right arm 1:30 over 82  HENT:  Head: Normocephalic.  Right Ear: External ear normal.  Left Ear: External ear normal.  Mouth/Throat: Oropharynx is clear and moist.  Eyes: Conjunctivae and EOM are normal. Pupils are equal, round, and reactive to light.  Neck: Normal range of motion. Neck supple. No thyromegaly present.  Cardiovascular: Normal rate, regular rhythm, normal heart sounds and intact distal pulses.   Pulmonary/Chest: Effort normal and breath sounds normal. No respiratory distress. She has no wheezes. She has no rales.  O2 saturation 98%  Abdominal: Soft. Bowel sounds are normal. She exhibits no mass. There is no tenderness.  Musculoskeletal: Normal range of motion.  Lymphadenopathy:    She has no cervical adenopathy.  Neurological: She is alert and oriented to person, place, and time.  Skin: Skin is warm and dry. No rash noted.  Psychiatric: She has a normal mood and affect. Her behavior is normal.          Assessment & Plan:   Chronic low back pain status post laminectomy for spinal stenosis Hypertension Ongoing tobacco use.  Total smoking cessation encouraged ADHD.  Presently his medications are on hold Dyslipidemia.  Will switch to atorvastatin at your present supply of Crestor  Recheck 3 months

## 2014-04-20 ENCOUNTER — Telehealth: Payer: Self-pay | Admitting: Internal Medicine

## 2014-04-20 NOTE — Telephone Encounter (Signed)
emmi emailed °

## 2014-04-23 ENCOUNTER — Other Ambulatory Visit: Payer: Self-pay | Admitting: Internal Medicine

## 2014-06-03 ENCOUNTER — Other Ambulatory Visit: Payer: Self-pay

## 2014-06-03 DIAGNOSIS — Z1231 Encounter for screening mammogram for malignant neoplasm of breast: Secondary | ICD-10-CM

## 2014-06-29 ENCOUNTER — Other Ambulatory Visit: Payer: Self-pay | Admitting: Internal Medicine

## 2014-07-04 ENCOUNTER — Inpatient Hospital Stay: Admission: RE | Admit: 2014-07-04 | Payer: BC Managed Care – PPO | Source: Ambulatory Visit

## 2014-07-05 ENCOUNTER — Other Ambulatory Visit: Payer: Self-pay | Admitting: Internal Medicine

## 2014-08-24 ENCOUNTER — Other Ambulatory Visit: Payer: Self-pay | Admitting: Internal Medicine

## 2014-09-20 ENCOUNTER — Other Ambulatory Visit: Payer: Self-pay | Admitting: Internal Medicine

## 2014-10-03 ENCOUNTER — Other Ambulatory Visit: Payer: Self-pay | Admitting: Internal Medicine

## 2014-10-20 ENCOUNTER — Other Ambulatory Visit: Payer: Self-pay | Admitting: Internal Medicine

## 2014-10-30 ENCOUNTER — Other Ambulatory Visit: Payer: Self-pay | Admitting: Physical Medicine & Rehabilitation

## 2014-10-31 ENCOUNTER — Other Ambulatory Visit: Payer: Self-pay | Admitting: Physical Medicine & Rehabilitation

## 2014-11-01 ENCOUNTER — Telehealth: Payer: Self-pay | Admitting: Internal Medicine

## 2014-11-01 NOTE — Telephone Encounter (Signed)
Catherine Craig, please call pt and schedule appointment for follow up on medications. Pt was suppose to come back in 3 months, last seen 04/2014. No refill till seen.

## 2014-11-01 NOTE — Telephone Encounter (Signed)
Pt needs new rx generic adderall 10 mg. Pt also needs refill on tramadol call into [facility]. Pt is having back surgery again on 12-01-14 at [facility].

## 2014-11-02 ENCOUNTER — Ambulatory Visit (INDEPENDENT_AMBULATORY_CARE_PROVIDER_SITE_OTHER): Payer: BLUE CROSS/BLUE SHIELD | Admitting: Internal Medicine

## 2014-11-02 ENCOUNTER — Encounter: Payer: Self-pay | Admitting: Internal Medicine

## 2014-11-02 VITALS — BP 120/90 | HR 89 | Temp 98.1°F | Resp 20 | Ht 63.0 in | Wt 158.0 lb

## 2014-11-02 DIAGNOSIS — E785 Hyperlipidemia, unspecified: Secondary | ICD-10-CM | POA: Diagnosis not present

## 2014-11-02 DIAGNOSIS — F908 Attention-deficit hyperactivity disorder, other type: Secondary | ICD-10-CM

## 2014-11-02 DIAGNOSIS — Z720 Tobacco use: Secondary | ICD-10-CM | POA: Diagnosis not present

## 2014-11-02 DIAGNOSIS — M159 Polyosteoarthritis, unspecified: Secondary | ICD-10-CM

## 2014-11-02 DIAGNOSIS — M150 Primary generalized (osteo)arthritis: Secondary | ICD-10-CM

## 2014-11-02 DIAGNOSIS — I10 Essential (primary) hypertension: Secondary | ICD-10-CM

## 2014-11-02 MED ORDER — AMPHETAMINE-DEXTROAMPHETAMINE 10 MG PO TABS: 10.0000 mg | ORAL_TABLET | Freq: Two times a day (BID) | ORAL | Status: DC

## 2014-11-02 MED ORDER — TRAMADOL HCL 50 MG PO TABS: 50.0000 mg | ORAL_TABLET | Freq: Four times a day (QID) | ORAL | Status: DC | PRN

## 2014-11-02 MED ORDER — ROSUVASTATIN CALCIUM 10 MG PO TABS: 10.0000 mg | ORAL_TABLET | Freq: Every day | ORAL | Status: DC

## 2014-11-02 MED ORDER — HYDROCODONE-ACETAMINOPHEN 5-325 MG PO TABS: 1.0000 | ORAL_TABLET | Freq: Four times a day (QID) | ORAL | Status: DC | PRN

## 2014-11-02 NOTE — Progress Notes (Signed)
Subjective:    Patient ID: Catherine Craig, female    DOB: 1956-06-15, 59 y.o.   MRN: [id number]  HPI  59 year old patient who is seen today for in follow-up.  She has a history of hypertension, dyslipidemia and ongoing tobacco use.  She is scheduled for additional spinal surgery in a couple of weeks.  She has cut back her smoking consumption some and has been off all anti-inflammatory medications.  She requires an Adderall refilled.  She has done well on this medication. Blood pressure generally has done well. Continues to have considerable back pain.  Past Medical History  Diagnosis Date  . ADHD 02/10/2007  . ALLERGIC RHINITIS 12/15/2006  . BASAL CELL CARCINOMA, HX OF 07/18/2009  . FIBROMYALGIA 12/15/2006  . GERD 12/15/2006  . HYPERLIPIDEMIA 12/15/2006  . HYPERTENSION 12/15/2006  . MENOPAUSAL SYNDROME 12/15/2006  . OSTEOARTHRITIS 02/11/2008  . Renal colic 0/35/0093  . VAGINITIS, ATROPHIC 01/05/2007  . Histoplasmosis with retinitis   . Heart murmur     earlier-  not heard now  . Anxiety   . Shortness of breath   . Cancer     left side of nose  . IBS (irritable bowel syndrome)   . Anal fissure   . Thoracic outlet syndrome   . Lipoma     on chest  . Fibromyalgia     History   Social History  . Marital Status: Married    Spouse Name: N/A  . Number of Children: N/A  . Years of Education: N/A   Occupational History  . Not on file.   Social History Main Topics  . Smoking status: Current Every Day Smoker -- 1.00 packs/day for 30 years    Types: Cigarettes  . Smokeless tobacco: Never Used     Comment: using electronic cigarette as of 03/2012  . Alcohol Use: Yes     Comment: social  . Drug Use: No  . Sexual Activity: Not on file   Other Topics Concern  . Not on file   Social History Narrative    Past Surgical History  Procedure Laterality Date  . Appendectomy    . Tonsillectomy    . Dilation and curettage of uterus    . Tonsillectomy    . Eye surgery      Lazer  to right x 6  . Spine surgery  04/16/2012    Family History  Problem Relation Age of Onset  . COPD Neg Hx     family  . Heart disease Neg Hx     family  . ADD / ADHD Neg Hx     family    Allergies  Allergen Reactions  . Ivp Dye [Iodinated Diagnostic Agents] Hives    Fluroscan eye dye per patient  . Lyrica [Pregabalin] Swelling  . Chantix [Varenicline Tartrate] Nausea And Vomiting    Current Outpatient Prescriptions on File Prior to Visit  Medication Sig Dispense Refill  . amphetamine-dextroamphetamine (ADDERALL) 10 MG tablet Take 1 tablet (10 mg total) by mouth 2 (two) times daily. 60 tablet 0  . b complex vitamins tablet Take 1 tablet by mouth at bedtime.    . Calcium Citrate-Vitamin D (CITRACAL + D PO) Take 1 tablet by mouth 2 (two) times daily.    . Coenzyme Q10 (COQ-10) 200 MG CAPS Take 1 tablet by mouth 2 (two) times daily.    . cyclobenzaprine (FLEXERIL) 10 MG tablet TAKE 1 TABLET BY MOUTH 3 TIMES A DAY AS NEEDED FOR MUSCLE SPASM 90 tablet  2  . fexofenadine-pseudoephedrine (ALLEGRA-D) 60-120 MG per tablet Take 1 tablet by mouth daily as needed. For allergies    . furosemide (LASIX) 20 MG tablet Take 20 mg by mouth daily as needed. For swelling    . gabapentin (NEURONTIN) 100 MG capsule TAKE ONE CAPSULE BY MOUTH 3 TIMES A DAY 90 capsule 1  . gabapentin (NEURONTIN) 400 MG capsule TAKE ONE CAPSULE AT BEDTIME AS NEEDED 30 capsule 5  . KRILL OIL PO Take 1 tablet by mouth every morning.    Marland Kitchen MAGNESIUM CITRATE PO Take by mouth.    . Multiple Vitamin (MULTIVITAMIN WITH MINERALS) TABS Take 0.5 tablets by mouth 2 (two) times daily.    . naproxen sodium (ANAPROX) 220 MG tablet Take 220 mg by mouth 2 (two) times daily with a meal.    . nystatin (MYCOSTATIN) 100000 UNIT/ML suspension     . traMADol (ULTRAM) 50 MG tablet TAKE ONE TABLET BY MOUTH EVERY SIX HOURS AS NEEDED 120 tablet 0  . triamterene-hydrochlorothiazide (MAXZIDE) 75-50 MG per tablet TAKE 1/2 TABLET BY MOUTH EVERY DAY 90  tablet 0  . vitamin C (ASCORBIC ACID) 500 MG tablet Take 500 mg by mouth 2 (two) times daily.    Marland Kitchen aspirin 325 MG tablet Take 325 mg by mouth at bedtime.     . [DISCONTINUED] fluticasone (FLONASE) 50 MCG/ACT nasal spray Place 1 spray into the nose daily. 16 g 2  . [DISCONTINUED] pravastatin (PRAVACHOL) 40 MG tablet Take 1 tablet (40 mg total) by mouth every evening. 90 tablet 3   No current facility-administered medications on file prior to visit.    BP 120/90 mmHg  Pulse 89  Temp(Src) 98.1 F (36.7 C) (Oral)  Resp 20  Ht 5\' 3"  (1.6 m)  Wt 158 lb (71.668 kg)  BMI 28.00 kg/m2  SpO2 96%     Review of Systems  Constitutional: Negative.   HENT: Negative for congestion, dental problem, hearing loss, rhinorrhea, sinus pressure, sore throat and tinnitus.   Eyes: Negative for pain, discharge and visual disturbance.  Respiratory: Negative for cough and shortness of breath.   Cardiovascular: Negative for chest pain, palpitations and leg swelling.  Gastrointestinal: Negative for nausea, vomiting, abdominal pain, diarrhea, constipation, blood in stool and abdominal distention.  Genitourinary: Negative for dysuria, urgency, frequency, hematuria, flank pain, vaginal bleeding, vaginal discharge, difficulty urinating, vaginal pain and pelvic pain.  Musculoskeletal: Positive for back pain and gait problem. Negative for joint swelling and arthralgias.  Skin: Negative for rash.  Neurological: Negative for dizziness, syncope, speech difficulty, weakness, numbness and headaches.  Hematological: Negative for adenopathy.  Psychiatric/Behavioral: Negative for behavioral problems, dysphoric mood and agitation. The patient is not nervous/anxious.        Objective:   Physical Exam  Constitutional: She is oriented to person, place, and time. She appears well-developed and well-nourished.  Blood pressure 130/90 right arm  HENT:  Head: Normocephalic.  Right Ear: External ear normal.  Left Ear:  External ear normal.  Mouth/Throat: Oropharynx is clear and moist.  Eyes: Conjunctivae and EOM are normal. Pupils are equal, round, and reactive to light.  Neck: Normal range of motion. Neck supple. No thyromegaly present.  Cardiovascular: Normal rate, regular rhythm, normal heart sounds and intact distal pulses.   Pulmonary/Chest: Effort normal and breath sounds normal.  Abdominal: Soft. Bowel sounds are normal. She exhibits no mass. There is no tenderness.  Musculoskeletal: Normal range of motion.  Lymphadenopathy:    She has no cervical adenopathy.  Neurological:  She is alert and oriented to person, place, and time.  Skin: Skin is warm and dry. No rash noted.  Psychiatric: She has a normal mood and affect. Her behavior is normal.          Assessment & Plan:   Hypertension.  Reasonable control Ongoing tobacco use.  Total smoking cessation preoperatively discussed Osteoarthritis ADHD.  Adderall refilled  Recheck 6 months

## 2014-11-02 NOTE — Telephone Encounter (Signed)
Pt has been sch for today 

## 2014-11-02 NOTE — Progress Notes (Signed)
Pre visit review using our clinic review tool, if applicable. No additional management support is needed unless otherwise documented below in the visit note. 

## 2014-11-02 NOTE — Patient Instructions (Signed)
Limit your sodium (Salt) intake  Please check your blood pressure on a regular basis.  If it is consistently greater than 150/90, please make an office appointment.  Smoking tobacco is very bad for your health. You should stop smoking immediately.  Return in 6 months for follow-up  

## 2014-12-13 ENCOUNTER — Other Ambulatory Visit: Payer: Self-pay | Admitting: Internal Medicine

## 2015-02-11 ENCOUNTER — Ambulatory Visit (HOSPITAL_COMMUNITY): Admit: 2015-02-11 | Payer: Self-pay | Admitting: Interventional Cardiology

## 2015-02-11 ENCOUNTER — Inpatient Hospital Stay (HOSPITAL_COMMUNITY)
Admission: EM | Admit: 2015-02-11 | Discharge: 2015-02-17 | DRG: 270 | Disposition: A | Discharge status: Home / Self Care | Payer: BLUE CROSS/BLUE SHIELD | Attending: Interventional Cardiology | Admitting: Interventional Cardiology

## 2015-02-11 ENCOUNTER — Encounter (HOSPITAL_COMMUNITY)
Admission: EM | Disposition: A | Discharge status: Home / Self Care | Payer: BLUE CROSS/BLUE SHIELD | Source: Home / Self Care | Attending: Interventional Cardiology

## 2015-02-11 ENCOUNTER — Emergency Department (HOSPITAL_COMMUNITY): Payer: BLUE CROSS/BLUE SHIELD

## 2015-02-11 ENCOUNTER — Encounter (HOSPITAL_COMMUNITY): Payer: Self-pay | Admitting: Emergency Medicine

## 2015-02-11 ENCOUNTER — Inpatient Hospital Stay (HOSPITAL_COMMUNITY): Payer: BLUE CROSS/BLUE SHIELD

## 2015-02-11 DIAGNOSIS — F909 Attention-deficit hyperactivity disorder, unspecified type: Secondary | ICD-10-CM | POA: Diagnosis present

## 2015-02-11 DIAGNOSIS — Z7982 Long term (current) use of aspirin: Secondary | ICD-10-CM

## 2015-02-11 DIAGNOSIS — T463X5A Adverse effect of coronary vasodilators, initial encounter: Secondary | ICD-10-CM | POA: Diagnosis present

## 2015-02-11 DIAGNOSIS — I4510 Unspecified right bundle-branch block: Secondary | ICD-10-CM | POA: Diagnosis present

## 2015-02-11 DIAGNOSIS — I2510 Atherosclerotic heart disease of native coronary artery without angina pectoris: Secondary | ICD-10-CM

## 2015-02-11 DIAGNOSIS — F17210 Nicotine dependence, cigarettes, uncomplicated: Secondary | ICD-10-CM | POA: Diagnosis present

## 2015-02-11 DIAGNOSIS — E876 Hypokalemia: Secondary | ICD-10-CM | POA: Diagnosis not present

## 2015-02-11 DIAGNOSIS — R739 Hyperglycemia, unspecified: Secondary | ICD-10-CM | POA: Diagnosis present

## 2015-02-11 DIAGNOSIS — M1990 Unspecified osteoarthritis, unspecified site: Secondary | ICD-10-CM | POA: Diagnosis present

## 2015-02-11 DIAGNOSIS — Q279 Congenital malformation of peripheral vascular system, unspecified: Secondary | ICD-10-CM

## 2015-02-11 DIAGNOSIS — I959 Hypotension, unspecified: Secondary | ICD-10-CM

## 2015-02-11 DIAGNOSIS — I10 Essential (primary) hypertension: Secondary | ICD-10-CM | POA: Diagnosis present

## 2015-02-11 DIAGNOSIS — I2119 ST elevation (STEMI) myocardial infarction involving other coronary artery of inferior wall: Secondary | ICD-10-CM

## 2015-02-11 DIAGNOSIS — I213 ST elevation (STEMI) myocardial infarction of unspecified site: Secondary | ICD-10-CM | POA: Diagnosis not present

## 2015-02-11 DIAGNOSIS — I4729 Other ventricular tachycardia: Secondary | ICD-10-CM

## 2015-02-11 DIAGNOSIS — Z4509 Encounter for adjustment and management of other cardiac device: Secondary | ICD-10-CM

## 2015-02-11 DIAGNOSIS — I2111 ST elevation (STEMI) myocardial infarction involving right coronary artery: Secondary | ICD-10-CM

## 2015-02-11 DIAGNOSIS — E785 Hyperlipidemia, unspecified: Secondary | ICD-10-CM | POA: Diagnosis present

## 2015-02-11 DIAGNOSIS — R0789 Other chest pain: Secondary | ICD-10-CM | POA: Diagnosis present

## 2015-02-11 DIAGNOSIS — Z91041 Radiographic dye allergy status: Secondary | ICD-10-CM | POA: Diagnosis not present

## 2015-02-11 DIAGNOSIS — I472 Ventricular tachycardia: Secondary | ICD-10-CM | POA: Diagnosis not present

## 2015-02-11 DIAGNOSIS — R570 Cardiogenic shock: Secondary | ICD-10-CM

## 2015-02-11 DIAGNOSIS — Z888 Allergy status to other drugs, medicaments and biological substances status: Secondary | ICD-10-CM | POA: Diagnosis not present

## 2015-02-11 DIAGNOSIS — M797 Fibromyalgia: Secondary | ICD-10-CM | POA: Diagnosis present

## 2015-02-11 DIAGNOSIS — I2129 ST elevation (STEMI) myocardial infarction involving other sites: Secondary | ICD-10-CM | POA: Diagnosis not present

## 2015-02-11 DIAGNOSIS — K219 Gastro-esophageal reflux disease without esophagitis: Secondary | ICD-10-CM | POA: Diagnosis present

## 2015-02-11 DIAGNOSIS — Z720 Tobacco use: Secondary | ICD-10-CM | POA: Diagnosis present

## 2015-02-11 DIAGNOSIS — D649 Anemia, unspecified: Secondary | ICD-10-CM | POA: Diagnosis not present

## 2015-02-11 DIAGNOSIS — Z791 Long term (current) use of non-steroidal anti-inflammatories (NSAID): Secondary | ICD-10-CM

## 2015-02-11 DIAGNOSIS — I083 Combined rheumatic disorders of mitral, aortic and tricuspid valves: Secondary | ICD-10-CM | POA: Diagnosis present

## 2015-02-11 DIAGNOSIS — Z955 Presence of coronary angioplasty implant and graft: Secondary | ICD-10-CM

## 2015-02-11 DIAGNOSIS — I952 Hypotension due to drugs: Secondary | ICD-10-CM | POA: Diagnosis present

## 2015-02-11 DIAGNOSIS — J9690 Respiratory failure, unspecified, unspecified whether with hypoxia or hypercapnia: Secondary | ICD-10-CM

## 2015-02-11 DIAGNOSIS — I219 Acute myocardial infarction, unspecified: Secondary | ICD-10-CM

## 2015-02-11 HISTORY — DX: Essential (primary) hypertension: I10

## 2015-02-11 HISTORY — DX: Endocarditis, valve unspecified: I38

## 2015-02-11 HISTORY — DX: Unspecified osteoarthritis, unspecified site: M19.90

## 2015-02-11 HISTORY — DX: Other ventricular tachycardia: I47.29

## 2015-02-11 HISTORY — DX: Ventricular tachycardia: I47.2

## 2015-02-11 HISTORY — DX: Anemia, unspecified: D64.9

## 2015-02-11 HISTORY — DX: Tobacco use: Z72.0

## 2015-02-11 HISTORY — DX: Hypomagnesemia: E83.42

## 2015-02-11 HISTORY — DX: Hyperlipidemia, unspecified: E78.5

## 2015-02-11 HISTORY — DX: Allergic rhinitis, unspecified: J30.9

## 2015-02-11 HISTORY — DX: Atherosclerotic heart disease of native coronary artery without angina pectoris: I25.10

## 2015-02-11 HISTORY — DX: Hypokalemia: E87.6

## 2015-02-11 HISTORY — PX: CARDIAC CATHETERIZATION: SHX172

## 2015-02-11 HISTORY — DX: Cardiogenic shock: R57.0

## 2015-02-11 HISTORY — DX: Basal cell carcinoma of skin, unspecified: C44.91

## 2015-02-11 LAB — TROPONIN I
TROPONIN I: 7.15 ng/mL — AB (ref <0.031)
TROPONIN I: 40.55 ng/mL — AB (ref <0.031)
Troponin I: 33.77 ng/mL (ref <0.031)

## 2015-02-11 LAB — BASIC METABOLIC PANEL
ANION GAP: 7 (ref 5–15)
Anion gap: 6 (ref 5–15)
BUN: 11 mg/dL (ref 6–20)
BUN: 7 mg/dL (ref 6–20)
CALCIUM: 9 mg/dL (ref 8.9–10.3)
CHLORIDE: 104 mmol/L (ref 101–111)
CO2: 27 mmol/L (ref 22–32)
CO2: 20 mmol/L — ABNORMAL LOW (ref 22–32)
CREATININE: 0.81 mg/dL (ref 0.44–1.00)
Calcium: 10.3 mg/dL (ref 8.9–10.3)
Chloride: 109 mmol/L (ref 101–111)
Creatinine, Ser: 1.07 mg/dL — ABNORMAL HIGH (ref 0.44–1.00)
GFR calc Af Amer: >60 mL/min (ref >60)
GFR calc non Af Amer: 55 mL/min — ABNORMAL LOW (ref >60)
GLUCOSE: 131 mg/dL — AB (ref 65–99)
Glucose, Bld: 172 mg/dL — ABNORMAL HIGH (ref 65–99)
POTASSIUM: 3.9 mmol/L (ref 3.5–5.1)
Potassium: 3.8 mmol/L (ref 3.5–5.1)
SODIUM: 136 mmol/L (ref 135–145)
Sodium: 137 mmol/L (ref 135–145)

## 2015-02-11 LAB — LIPID PANEL
Cholesterol: 149 mg/dL (ref 0–200)
HDL: 39 mg/dL — AB (ref >40)
LDL Cholesterol: 76 mg/dL (ref 0–99)
TRIGLYCERIDES: 171 mg/dL — AB (ref <150)
Total CHOL/HDL Ratio: 3.8 RATIO
VLDL: 34 mg/dL (ref 0–40)

## 2015-02-11 LAB — CBC
HEMATOCRIT: 41 % (ref 36.0–46.0)
HEMATOCRIT: 36 % (ref 36.0–46.0)
HEMOGLOBIN: 12.1 g/dL (ref 12.0–15.0)
Hemoglobin: 13.5 g/dL (ref 12.0–15.0)
MCH: 27.8 pg (ref 26.0–34.0)
MCH: 27.4 pg (ref 26.0–34.0)
MCHC: 32.9 g/dL (ref 30.0–36.0)
MCHC: 33.6 g/dL (ref 30.0–36.0)
MCV: 84.5 fL (ref 78.0–100.0)
MCV: 81.6 fL (ref 78.0–100.0)
Platelets: 289 10*3/uL (ref 150–400)
Platelets: 311 10*3/uL (ref 150–400)
RBC: 4.85 MIL/uL (ref 3.87–5.11)
RBC: 4.41 MIL/uL (ref 3.87–5.11)
RDW: 15.9 % — AB (ref 11.5–15.5)
RDW: 15.9 % — AB (ref 11.5–15.5)
WBC: 15.2 10*3/uL — AB (ref 4.0–10.5)
WBC: 16.3 10*3/uL — ABNORMAL HIGH (ref 4.0–10.5)

## 2015-02-11 LAB — I-STAT TROPONIN, ED: Troponin i, poc: 0.22 ng/mL (ref 0.00–0.08)

## 2015-02-11 LAB — PROTIME-INR
INR: 1.88 — AB (ref 0.00–1.49)
Prothrombin Time: 21.5 seconds — ABNORMAL HIGH (ref 11.6–15.2)

## 2015-02-11 LAB — HEPARIN LEVEL (UNFRACTIONATED)
HEPARIN UNFRACTIONATED: 0.1 [IU]/mL — AB (ref 0.30–0.70)
HEPARIN UNFRACTIONATED: 0.18 [IU]/mL — AB (ref 0.30–0.70)

## 2015-02-11 LAB — BRAIN NATRIURETIC PEPTIDE: B NATRIURETIC PEPTIDE 5: 73.5 pg/mL (ref 0.0–100.0)

## 2015-02-11 SURGERY — LEFT HEART CATH AND CORONARY ANGIOGRAPHY
Anesthesia: Moderate Sedation

## 2015-02-11 MED ORDER — NITROGLYCERIN 0.4 MG SL SUBL
SUBLINGUAL_TABLET | SUBLINGUAL | Status: AC
  Administered 2015-02-11: 0.4 mg
  Filled 2015-02-11: qty 1

## 2015-02-11 MED ORDER — MORPHINE SULFATE (PF) 2 MG/ML IV SOLN
2.0000 mg | INTRAVENOUS | Status: DC | PRN
  Administered 2015-02-11 – 2015-02-13 (×10): 2 mg via INTRAVENOUS
  Filled 2015-02-11 (×10): qty 1

## 2015-02-11 MED ORDER — METOPROLOL TARTRATE 1 MG/ML IV SOLN
INTRAVENOUS | Status: AC
  Filled 2015-02-11: qty 5

## 2015-02-11 MED ORDER — ATROPINE SULFATE 0.1 MG/ML IJ SOLN
INTRAMUSCULAR | Status: DC | PRN
  Administered 2015-02-11: 1 mg via INTRAVENOUS

## 2015-02-11 MED ORDER — MIDAZOLAM HCL 2 MG/2ML IJ SOLN
INTRAMUSCULAR | Status: DC | PRN
  Administered 2015-02-11: 1 mg via INTRAVENOUS

## 2015-02-11 MED ORDER — SODIUM CHLORIDE 0.9 % IJ SOLN: 3.0000 mL | INTRAMUSCULAR | Status: DC | PRN

## 2015-02-11 MED ORDER — ASPIRIN 81 MG PO CHEW: 324.0000 mg | CHEWABLE_TABLET | Freq: Once | ORAL | Status: DC

## 2015-02-11 MED ORDER — MORPHINE SULFATE (PF) 4 MG/ML IV SOLN
4.0000 mg | Freq: Once | INTRAVENOUS | Status: AC
  Administered 2015-02-11: 4 mg via INTRAVENOUS
  Filled 2015-02-11: qty 1

## 2015-02-11 MED ORDER — NICOTINE 21 MG/24HR TD PT24
21.0000 mg | MEDICATED_PATCH | Freq: Every day | TRANSDERMAL | Status: DC
  Administered 2015-02-11 – 2015-02-17 (×7): 21 mg via TRANSDERMAL
  Filled 2015-02-11 (×7): qty 1

## 2015-02-11 MED ORDER — ASPIRIN 81 MG PO CHEW: 324.0000 mg | CHEWABLE_TABLET | ORAL | Status: DC

## 2015-02-11 MED ORDER — NITROGLYCERIN 0.4 MG SL SUBL: 0.4000 mg | SUBLINGUAL_TABLET | SUBLINGUAL | Status: DC | PRN

## 2015-02-11 MED ORDER — ROSUVASTATIN CALCIUM 10 MG PO TABS
10.0000 mg | ORAL_TABLET | Freq: Every day | ORAL | Status: DC
  Filled 2015-02-11: qty 1

## 2015-02-11 MED ORDER — COQ-10 200 MG PO CAPS: 1.0000 | ORAL_CAPSULE | Freq: Two times a day (BID) | ORAL | Status: DC

## 2015-02-11 MED ORDER — MORPHINE SULFATE (PF) 2 MG/ML IV SOLN
INTRAVENOUS | Status: AC
  Administered 2015-02-11: 2 mg via INTRAVENOUS
  Filled 2015-02-11: qty 1

## 2015-02-11 MED ORDER — DOPAMINE-DEXTROSE 3.2-5 MG/ML-% IV SOLN
INTRAVENOUS | Status: DC | PRN
  Administered 2015-02-11: 10 ug/kg/min via INTRAVENOUS

## 2015-02-11 MED ORDER — ACETAMINOPHEN 325 MG PO TABS: 650.0000 mg | ORAL_TABLET | ORAL | Status: DC | PRN

## 2015-02-11 MED ORDER — SODIUM CHLORIDE 0.9 % WEIGHT BASED INFUSION
3.0000 mL/kg/h | INTRAVENOUS | Status: AC
  Administered 2015-02-11 (×2): 3 mL/kg/h via INTRAVENOUS

## 2015-02-11 MED ORDER — FENTANYL CITRATE (PF) 100 MCG/2ML IJ SOLN
INTRAMUSCULAR | Status: DC | PRN
  Administered 2015-02-11: 50 ug via INTRAVENOUS

## 2015-02-11 MED ORDER — OXYCODONE-ACETAMINOPHEN 5-325 MG PO TABS
1.0000 | ORAL_TABLET | Freq: Four times a day (QID) | ORAL | Status: DC | PRN
  Administered 2015-02-11: 1 via ORAL
  Administered 2015-02-11 – 2015-02-17 (×21): 2 via ORAL
  Filled 2015-02-11 (×11): qty 2
  Filled 2015-02-11: qty 1
  Filled 2015-02-11 (×10): qty 2

## 2015-02-11 MED ORDER — HEPARIN (PORCINE) IN NACL 100-0.45 UNIT/ML-% IJ SOLN
1150.0000 [IU]/h | INTRAMUSCULAR | Status: DC
  Administered 2015-02-11: 900 [IU]/h via INTRAVENOUS
  Administered 2015-02-12 – 2015-02-13 (×2): 1150 [IU]/h via INTRAVENOUS
  Filled 2015-02-11 (×4): qty 250

## 2015-02-11 MED ORDER — TICAGRELOR 90 MG PO TABS
90.0000 mg | ORAL_TABLET | Freq: Two times a day (BID) | ORAL | Status: DC
  Administered 2015-02-11 – 2015-02-17 (×12): 90 mg via ORAL
  Filled 2015-02-11 (×13): qty 1

## 2015-02-11 MED ORDER — SODIUM CHLORIDE 0.9 % IV SOLN
INTRAVENOUS | Status: DC
  Administered 2015-02-11: 100 mL via INTRAVENOUS
  Administered 2015-02-12 – 2015-02-14 (×5): via INTRAVENOUS

## 2015-02-11 MED ORDER — SODIUM CHLORIDE 0.9 % IV SOLN
1.7500 mg/kg/h | Freq: Once | INTRAVENOUS | Status: DC
  Filled 2015-02-11: qty 250

## 2015-02-11 MED ORDER — SODIUM CHLORIDE 0.9 % IJ SOLN
3.0000 mL | Freq: Two times a day (BID) | INTRAMUSCULAR | Status: DC
  Administered 2015-02-11 – 2015-02-15 (×10): 3 mL via INTRAVENOUS
  Administered 2015-02-16 (×2): via INTRAVENOUS
  Administered 2015-02-16: 21:00:00 3 mL via INTRAVENOUS

## 2015-02-11 MED ORDER — SODIUM CHLORIDE 0.9 % IV SOLN: 250.0000 mL | INTRAVENOUS | Status: DC | PRN

## 2015-02-11 MED ORDER — HEPARIN SODIUM (PORCINE) 5000 UNIT/ML IJ SOLN
4000.0000 [IU] | INTRAMUSCULAR | Status: DC
  Filled 2015-02-11: qty 0.8

## 2015-02-11 MED ORDER — ONDANSETRON HCL 4 MG/2ML IJ SOLN: 4.0000 mg | Freq: Four times a day (QID) | INTRAMUSCULAR | Status: DC | PRN

## 2015-02-11 MED ORDER — ASPIRIN 81 MG PO CHEW
CHEWABLE_TABLET | ORAL | Status: AC
  Administered 2015-02-11: 03:00:00
  Filled 2015-02-11: qty 4

## 2015-02-11 MED ORDER — BIVALIRUDIN BOLUS VIA INFUSION - CUPID
INTRAVENOUS | Status: DC | PRN
  Administered 2015-02-11: 53.7 mg via INTRAVENOUS

## 2015-02-11 MED ORDER — TRAMADOL HCL 50 MG PO TABS
50.0000 mg | ORAL_TABLET | Freq: Four times a day (QID) | ORAL | Status: DC | PRN
  Administered 2015-02-13 – 2015-02-14 (×3): 50 mg via ORAL
  Filled 2015-02-11 (×3): qty 1

## 2015-02-11 MED ORDER — TICAGRELOR 90 MG PO TABS
ORAL_TABLET | ORAL | Status: DC | PRN
  Administered 2015-02-11: 180 mg via ORAL

## 2015-02-11 MED ORDER — ENOXAPARIN SODIUM 40 MG/0.4ML ~~LOC~~ SOLN: 40.0000 mg | SUBCUTANEOUS | Status: DC

## 2015-02-11 MED ORDER — ASPIRIN 300 MG RE SUPP: 300.0000 mg | RECTAL | Status: DC

## 2015-02-11 MED ORDER — VERAPAMIL HCL 2.5 MG/ML IV SOLN
INTRAVENOUS | Status: DC | PRN
  Administered 2015-02-11: 03:00:00 via INTRA_ARTERIAL

## 2015-02-11 MED ORDER — ASPIRIN 81 MG PO CHEW
81.0000 mg | CHEWABLE_TABLET | Freq: Every day | ORAL | Status: DC
  Administered 2015-02-11 – 2015-02-17 (×6): 81 mg via ORAL
  Filled 2015-02-11 (×7): qty 1

## 2015-02-11 MED ORDER — SODIUM CHLORIDE 0.9 % IV SOLN
250.0000 mg | INTRAVENOUS | Status: DC | PRN
  Administered 2015-02-11: 1.75 mg/kg/h via INTRAVENOUS

## 2015-02-11 MED ORDER — DOPAMINE-DEXTROSE 3.2-5 MG/ML-% IV SOLN
2.0000 ug/kg/min | INTRAVENOUS | Status: DC
  Administered 2015-02-11: 14 ug/kg/min via INTRAVENOUS
  Administered 2015-02-11: 20 ug/kg/min via INTRAVENOUS
  Administered 2015-02-12: 2 ug/kg/min via INTRAVENOUS
  Filled 2015-02-11 (×3): qty 250

## 2015-02-11 MED ORDER — IOHEXOL 350 MG/ML SOLN
INTRAVENOUS | Status: DC | PRN
  Administered 2015-02-11: 125 mL via INTRACARDIAC

## 2015-02-11 MED ORDER — SODIUM CHLORIDE 0.9 % IV SOLN: INTRAVENOUS | Status: DC

## 2015-02-11 MED ORDER — ROSUVASTATIN CALCIUM 20 MG PO TABS
40.0000 mg | ORAL_TABLET | Freq: Every day | ORAL | Status: DC
  Administered 2015-02-11 – 2015-02-16 (×6): 40 mg via ORAL
  Filled 2015-02-11: qty 1
  Filled 2015-02-11: qty 2
  Filled 2015-02-11 (×4): qty 1
  Filled 2015-02-11: qty 2

## 2015-02-11 SURGICAL SUPPLY — 22 items
BALLN EUPHORA RX 3.0X12 (BALLOONS) ×3
BALLN LINEAR 7.5FR IABP 34CC (BALLOONS) ×3
BALLOON EUPHORA RX 3.0X12 (BALLOONS) ×1 IMPLANT
BALLOON LINEAR 7.5FR IABP 34CC (BALLOONS) ×1 IMPLANT
CATH INFINITI 5 FR JL3.5 (CATHETERS) ×3 IMPLANT
CATH INFINITI JR4 5F (CATHETERS) ×3 IMPLANT
DEVICE RAD COMP TR BAND LRG (VASCULAR PRODUCTS) ×3 IMPLANT
DEVICE SECURE STATLOCK IABP (MISCELLANEOUS) ×6 IMPLANT
GLIDESHEATH SLEND A-KIT 6F 22G (SHEATH) ×3 IMPLANT
GUIDE CATH RUNWAY 6FR FR4 (CATHETERS) ×3 IMPLANT
KIT ENCORE 26 ADVANTAGE (KITS) ×3 IMPLANT
KIT HEART LEFT (KITS) ×3 IMPLANT
PACK CARDIAC CATHETERIZATION (CUSTOM PROCEDURE TRAY) ×3 IMPLANT
SHEATH PINNACLE 5F 10CM (SHEATH) ×3 IMPLANT
SHEATH PINNACLE 6F 10CM (SHEATH) ×3 IMPLANT
SHEATH PINNACLE 8F 10CM (SHEATH) ×3 IMPLANT
STENT PROMUS PREM MR 3.5X20 (Permanent Stent) ×3 IMPLANT
TRANSDUCER W/STOPCOCK (MISCELLANEOUS) ×3 IMPLANT
TUBING CIL FLEX 10 FLL-RA (TUBING) ×3 IMPLANT
WIRE ASAHI PROWATER 180CM (WIRE) ×3 IMPLANT
WIRE HI TORQ VERSACORE-J 145CM (WIRE) ×3 IMPLANT
WIRE SAFE-T 1.5MM-J .035X260CM (WIRE) ×3 IMPLANT

## 2015-02-11 NOTE — Procedures (Signed)
Central Venous Catheter Insertion Procedure Note Catherine Craig [id number] 1955/06/30  Procedure: Insertion of Central Venous Catheter Indications: Assessment of intravascular volume, Drug and/or fluid administration and Frequent blood sampling  Procedure Details Consent: Risks of procedure as well as the alternatives and risks of each were explained to the (patient/caregiver).  Consent for procedure obtained. Time Out: Verified patient identification, verified procedure, site/side was marked, verified correct patient position, special equipment/implants available, medications/allergies/relevent history reviewed, required imaging and test results available.  Performed  Maximum sterile technique was used including antiseptics, cap, gloves, gown, hand hygiene, mask and sheet. Skin prep: Chlorhexidine; local anesthetic administered A antimicrobial bonded/coated triple lumen catheter was placed in the left internal jugular vein using the Seldinger technique. Ultrasound guidance used.Yes.   Catheter placed to 20 cm. Blood aspirated via all 3 ports and then flushed x 3. Line sutured x 2 and dressing applied.  Evaluation Blood flow good Complications: No apparent complications Patient did tolerate procedure well. Chest X-ray ordered to verify placement.  CXR: pending.  Richardson Landry Calani Gick ACNP Maryanna Shape PCCM Pager [phone] till 3 pm If no answer page [phone] 02/11/2015, 8:00 AM

## 2015-02-11 NOTE — H&P (Signed)
History and Physical  Patient ID: Catherine Craig MRN: [id number], DOB: 03-May-1956 Date of Encounter: 02/11/2015, 3:40 AM Primary Physician: Nyoka Cowden, MD Primary Cardiologist: None.  Chief Complaint: Chest and jaw pain Reason for Admission: STEMI  HPI: 59 year old woman with history of hypertension, hyperlipidemia, and chronic back pain, presents the emergency department with approximately 2 hours of jaw pain which subsequently radiated to the chest.  The patient proceeded to the [facility] emergency department, sure she was found to have evidence of inferior ST segment elevation area STEMI alert was activated and the patient has been transferred to [facility] for emergent left heart catheterization.  The patient received aspirin 325 mg 1, heparin 4000 units IV 1, and sublingual nitroglycerin.  Of note, she became transiently hypotensive with nitroglycerin, which resolved with IV fluid bolus.  At this time, the patient reports that her chest pain has resolved, though she continues to have jaw and left arm discomfort  Past Medical History  Diagnosis Date  . ADHD 02/10/2007  . ALLERGIC RHINITIS 12/15/2006  . BASAL CELL CARCINOMA, HX OF 07/18/2009  . FIBROMYALGIA 12/15/2006  . GERD 12/15/2006  . HYPERLIPIDEMIA 12/15/2006  . HYPERTENSION 12/15/2006  . MENOPAUSAL SYNDROME 12/15/2006  . OSTEOARTHRITIS 02/11/2008  . Renal colic 4/69/6295  . VAGINITIS, ATROPHIC 01/05/2007  . Histoplasmosis with retinitis   . Heart murmur     earlier-  not heard now  . Anxiety   . Shortness of breath   . Cancer     left side of nose  . IBS (irritable bowel syndrome)   . Anal fissure   . Thoracic outlet syndrome   . Lipoma     on chest  . Fibromyalgia      Most Recent Cardiac Studies: Echocardiogram (03/27/12): Normal LV function with EF of 60-65%.  Lipomatous hypertrophy of the interatrial septum.  Trial aortic regurgitation.  Pharmacologic myocardial perfusion scan (03/26/12): Normal study.   LVEF 83%.     Surgical History:  Past Surgical History  Procedure Laterality Date  . Appendectomy    . Tonsillectomy    . Dilation and curettage of uterus    . Tonsillectomy    . Eye surgery      Lazer to right x 6  . Spine surgery  04/16/2012     Home Meds: Prior to Admission medications   Medication Sig Start Date Audiel Scheiber Date Taking? Authorizing Provider  amphetamine-dextroamphetamine (ADDERALL) 10 MG tablet Take 1 tablet (10 mg total) by mouth 2 (two) times daily. 11/02/14   Marletta Lor, MD  amphetamine-dextroamphetamine (ADDERALL) 10 MG tablet Take 1 tablet (10 mg total) by mouth 2 (two) times daily. 11/02/14   Marletta Lor, MD  amphetamine-dextroamphetamine (ADDERALL) 10 MG tablet Take 1 tablet (10 mg total) by mouth 2 (two) times daily. 11/02/14   Marletta Lor, MD  aspirin 325 MG tablet Take 325 mg by mouth at bedtime.     Historical Provider, MD  b complex vitamins tablet Take 1 tablet by mouth at bedtime.    Historical Provider, MD  Calcium Citrate-Vitamin D (CITRACAL + D PO) Take 1 tablet by mouth 2 (two) times daily.    Historical Provider, MD  Coenzyme Q10 (COQ-10) 200 MG CAPS Take 1 tablet by mouth 2 (two) times daily.    Historical Provider, MD  cyclobenzaprine (FLEXERIL) 10 MG tablet TAKE 1 TABLET BY MOUTH 3 TIMES A DAY AS NEEDED FOR MUSCLE SPASM 10/20/14   Marletta Lor, MD  fexofenadine-pseudoephedrine (ALLEGRA-D) 60-120  MG per tablet Take 1 tablet by mouth daily as needed. For allergies    Historical Provider, MD  furosemide (LASIX) 20 MG tablet Take 20 mg by mouth daily as needed. For swelling    Historical Provider, MD  gabapentin (NEURONTIN) 400 MG capsule TAKE ONE CAPSULE AT BEDTIME AS NEEDED 12/13/14   Marletta Lor, MD  HYDROcodone-acetaminophen (NORCO/VICODIN) 5-325 MG per tablet Take 1 tablet by mouth every 6 (six) hours as needed for moderate pain. 11/02/14   Marletta Lor, MD  KRILL OIL PO Take 1 tablet by mouth every morning.     Historical Provider, MD  MAGNESIUM CITRATE PO Take by mouth.    Historical Provider, MD  Multiple Vitamin (MULTIVITAMIN WITH MINERALS) TABS Take 0.5 tablets by mouth 2 (two) times daily.    Historical Provider, MD  naproxen sodium (ANAPROX) 220 MG tablet Take 220 mg by mouth 2 (two) times daily with a meal.    Historical Provider, MD  nystatin (MYCOSTATIN) 100000 UNIT/ML suspension  08/30/13   Historical Provider, MD  rosuvastatin (CRESTOR) 10 MG tablet Take 1 tablet (10 mg total) by mouth daily. 11/02/14   Marletta Lor, MD  traMADol (ULTRAM) 50 MG tablet Take 1 tablet (50 mg total) by mouth every 6 (six) hours as needed. 11/02/14   Marletta Lor, MD  triamterene-hydrochlorothiazide (MAXZIDE) 75-50 MG per tablet TAKE 1/2 TABLET BY MOUTH EVERY DAY 09/20/14   Marletta Lor, MD  vitamin C (ASCORBIC ACID) 500 MG tablet Take 500 mg by mouth 2 (two) times daily.    Historical Provider, MD    Allergies:  Allergies  Allergen Reactions  . Ivp Dye [Iodinated Diagnostic Agents] Hives    Fluroscan eye dye per patient  . Lyrica [Pregabalin] Swelling  . Chantix [Varenicline Tartrate] Nausea And Vomiting    Social History   Social History  . Marital Status: Married    Spouse Name: N/A  . Number of Children: N/A  . Years of Education: N/A   Occupational History  . Not on file.   Social History Main Topics  . Smoking status: Current Every Day Smoker -- 1.00 packs/day for 30 years    Types: Cigarettes  . Smokeless tobacco: Never Used     Comment: using electronic cigarette as of 03/2012  . Alcohol Use: Yes     Comment: social  . Drug Use: No  . Sexual Activity: Not on file   Other Topics Concern  . Not on file   Social History Narrative     Family History  Problem Relation Age of Onset  . COPD Neg Hx     family  . Heart disease Neg Hx     family  . ADD / ADHD Neg Hx     family    Review of Systems: Patient reports chronic back pain and is recovering from back  surgery 6 weeks ago.  Otherwise, a 12 point review systems performed and was negative except as noted in the history of present illness.  Labs:   Lab Results  Component Value Date   WBC 15.2* 02/11/2015   HGB 13.5 02/11/2015   HCT 41.0 02/11/2015   MCV 84.5 02/11/2015   PLT 289 02/11/2015     Recent Labs Lab 02/11/15 0239  NA 137  K 3.9  CL 104  CO2 27  BUN 11  CREATININE 1.07*  CALCIUM 10.3  GLUCOSE 131*   POC Troponin: 0.22  Lab Results  Component Value Date  CHOL 207* 01/07/2014   HDL 38.20* 01/07/2014   TRIG 368.0* 01/07/2014   No results found for: DDIMER  Radiology/Studies:  Dg Chest 2 View  02/11/2015   CLINICAL DATA:  Left-sided chest pain radiating to the left arm. Jaw pain. Shortness of breath.  EXAM: CHEST  2 VIEW  COMPARISON:  10/28/2011  FINDINGS: The cardiomediastinal contours are normal. Mild bronchial thickening in the upper lobes, right greater than left. Pulmonary vasculature is normal. No consolidation, pleural effusion, or pneumothorax. Posterior fusion with rods and intrapedicular screws, mid-lower thoracic and lumbar spine. No acute osseous abnormalities are seen.  IMPRESSION: Mild upper lobe bronchial thickening.  Otherwise no acute process.   Electronically Signed   By: Jeb Levering M.D.   On: 02/11/2015 02:46   Wt Readings from Last 3 Encounters:  11/02/14 71.668 kg (158 lb)  04/19/14 77.565 kg (171 lb)  02/14/14 78.472 kg (173 lb)   EKG: Sinus rhythm with right bundle branch block with acute inferolateral MI.  Physical Exam: Blood pressure 89/64, pulse 99, temperature 97.8 F (36.6 C), temperature source Oral, resp. rate 14, SpO2 99 %. General: Well developed, well nourished, in no acute distress. Head: Normocephalic, atraumatic, sclera non-icteric, no xanthomas, nares are without discharge.  Neck: Negative for carotid bruits. JVD not elevated. Lungs: Clear bilaterally to auscultation without wheezes, rales, or rhonchi. Breathing is  unlabored. Heart: RRR with S1 S2. No murmurs, rubs, or gallops appreciated. Abdomen: Soft, non-tender, non-distended with normoactive bowel sounds. No hepatomegaly. No rebound/guarding. No obvious abdominal masses. Msk:  Strength and tone appear normal for age. Extremities: No clubbing or cyanosis. No edema.  2+ radial and femoral pulses bilaterally.  Distal pedal pulses are 2+ and equal bilaterally. Neuro: Alert and oriented X 3. No focal deficit. No facial asymmetry. Moves all extremities spontaneously. Psych:  Responds to questions appropriately with a normal affect.    ASSESSMENT AND PLAN:  59 year old woman with history of hypertension, hyperlipidemia, chronic back pain, presenting with 2 hours of jaw pain radiating to the chest and left shoulder and EKG changes consistent with inferolateral STEMI.  Patient is currently undergoing primary PCI to occluded proximal RCA. - Admit to cardiac ICU for post PCI monitoring. - Dual antiplatelet therapy, per Dr. Tamala Julian. - Risk stratify with lipid panel and hemoglobin A1c. - In the meantime, continue home rosuvastatin dose, though this may need to be uptitrated, given her newly discovered CAD. - Obtain transthoracic echocardiogram. - Initiate low-dose beta blocker when possible with careful uptitration, as heart rate and blood pressure allow.  Angelina Sheriff MD 02/11/2015, 3:40 AM

## 2015-02-11 NOTE — Progress Notes (Signed)
CRITICAL VALUE ALERT  Critical value received: troponin 33.77   Date of notification: 02/11/2015   Time of notification:  7262   Critical value read back: yes  Nurse who received alert:  Warden Fillers RN  MD notified (1st page):  B. Hagar  Time of first page:  63  MD notified (2nd page):  Time of second page:  Responding MD:    Time MD responded:  0355

## 2015-02-11 NOTE — Progress Notes (Signed)
ANTICOAGULATION CONSULT NOTE - Follow Up Consult  Pharmacy Consult for heparin  Indication: IABP  Allergies  Allergen Reactions  . Bee Venom Swelling  . Ivp Dye [Iodinated Diagnostic Agents] Hives    Fluroscan eye dye per patient  . Lyrica [Pregabalin] Swelling  . Chantix [Varenicline Tartrate] Nausea And Vomiting    Patient Measurements: Height: 5\' 3"  (160 cm) Weight: 158 lb 1.1 oz (71.7 kg) IBW/kg (Calculated) : 52.4 Heparin Dosing Weight: 70kg  Vital Signs: Temp: 97.9 F (36.6 C) (08/27 1940) Temp Source: Oral (08/27 1940) BP: 100/58 mmHg (08/27 2200) Pulse Rate: 92 (08/27 2230)  Labs:  Recent Labs  02/11/15 0239 02/11/15 0610 02/11/15 1148 02/11/15 1411 02/11/15 1733 02/11/15 2130  HGB 13.5 12.1  --   --   --   --   HCT 41.0 36.0  --   --   --   --   PLT 289 311  --   --   --   --   LABPROT  --  21.5*  --   --   --   --   INR  --  1.88*  --   --   --   --   HEPARINUNFRC  --   --   --  0.10*  --  0.18*  CREATININE 1.07* 0.81  --   --   --   --   TROPONINI  --  7.15* 33.77*  --  40.55*  --     Estimated Creatinine Clearance: 69.2 mL/min (by C-G formula based on Cr of 0.81).  Assessment: 59 yo female c/o left-sided CP radiating to left arm associated w/ jaw pain and SOB, called as code STEMI, IABP placed in cath lab and now on heparin. Heparin level 0.18 (slightly subtherapeutic) on 1050 units/hr. No issues with line or bleeding per RN.   Goal of Therapy:  Heparin level 0.2-0.5 units/ml Monitor platelets by anticoagulation protocol: Yes   Plan:  -Increase heparin to 1150 units/hr -Heparin level in 6 hours  Sherlon Handing, PharmD, BCPS Clinical pharmacist, pager [phone]  02/11/2015 10:51 PM

## 2015-02-11 NOTE — Progress Notes (Addendum)
SUBJECTIVE:  Complains of left arm pain  OBJECTIVE:   Vitals:   Filed Vitals:   02/11/15 0830 02/11/15 0836 02/11/15 0845 02/11/15 0900  BP: 111/62  112/71 126/69  Pulse: 93  63 92  Temp:  97.4 F (36.3 C)    TempSrc:  Oral    Resp: 21  18 21   Height:      Weight:      SpO2: 100%  100% 100%   I&O's:   Intake/Output Summary (Last 24 hours) at 02/11/15 0946 Last data filed at 02/11/15 0900  Gross per 24 hour  Intake 293.34 ml  Output   1085 ml  Net -791.66 ml   TELEMETRY: Reviewed telemetry pt in NSR:     PHYSICAL EXAM General: Well developed, well nourished, in no acute distress Head: Eyes PERRLA, No xanthomas.   Normal cephalic and atramatic  Lungs:   Clear bilaterally to auscultation and percussion. Heart:   HRRR S1 S2 Pulses are 2+ & equal. Abdomen: Bowel sounds are positive, abdomen soft and non-tender without masses  Extremities:   No clubbing, cyanosis or edema.  DP +1 Neuro: Alert and oriented X 3. Psych:  Good affect, responds appropriately   LABS: Basic Metabolic Panel:  Recent Labs  02/11/15 0239 02/11/15 0610  NA 137 136  K 3.9 3.8  CL 104 109  CO2 27 20*  GLUCOSE 131* 172*  BUN 11 7  CREATININE 1.07* 0.81  CALCIUM 10.3 9.0   Liver Function Tests: No results for input(s): AST, ALT, ALKPHOS, BILITOT, PROT, ALBUMIN in the last 72 hours. No results for input(s): LIPASE, AMYLASE in the last 72 hours. CBC:  Recent Labs  02/11/15 0239 02/11/15 0610  WBC 15.2* 16.3*  HGB 13.5 12.1  HCT 41.0 36.0  MCV 84.5 81.6  PLT 289 311   Cardiac Enzymes:  Recent Labs  02/11/15 0610  TROPONINI 7.15*   BNP: Invalid input(s): POCBNP D-Dimer: No results for input(s): DDIMER in the last 72 hours. Hemoglobin A1C: No results for input(s): HGBA1C in the last 72 hours. Fasting Lipid Panel:  Recent Labs  02/11/15 0611  CHOL 149  HDL 39*  LDLCALC 76  TRIG 171*  CHOLHDL 3.8   Thyroid Function Tests: No results for input(s): TSH, T4TOTAL,  T3FREE, THYROIDAB in the last 72 hours.  Invalid input(s): FREET3 Anemia Panel: No results for input(s): VITAMINB12, FOLATE, FERRITIN, TIBC, IRON, RETICCTPCT in the last 72 hours. Coag Panel:   Lab Results  Component Value Date   INR 1.88* 02/11/2015    RADIOLOGY: Dg Chest 2 View  02/11/2015   CLINICAL DATA:  Left-sided chest pain radiating to the left arm. Jaw pain. Shortness of breath.  EXAM: CHEST  2 VIEW  COMPARISON:  10/28/2011  FINDINGS: The cardiomediastinal contours are normal. Mild bronchial thickening in the upper lobes, right greater than left. Pulmonary vasculature is normal. No consolidation, pleural effusion, or pneumothorax. Posterior fusion with rods and intrapedicular screws, mid-lower thoracic and lumbar spine. No acute osseous abnormalities are seen.  IMPRESSION: Mild upper lobe bronchial thickening.  Otherwise no acute process.   Electronically Signed   By: Jeb Levering M.D.   On: 02/11/2015 02:46   Dg Chest Port 1 View  02/11/2015   CLINICAL DATA:  Respiratory failure, acute myocardial infarction, status post coronary intervention  EXAM: PORTABLE CHEST - 1 VIEW  COMPARISON:  02/11/2015  FINDINGS: Left IJ central line tip at the innominate SVC junction. Radiopaque aortic balloon pump tip in the proximal descending  thoracic aorta. Lower lung volumes evident. Heart is enlarged with mild diffuse vascular congestion pattern. No focal pneumonia, collapse or consolidation. No effusion or pneumothorax. Thoracic fusion hardware evident.  IMPRESSION: Support apparatus as above.  Low volume exam with mild cardiomegaly and vascular congestion   Electronically Signed   By: Jerilynn Mages.  Shick M.D.   On: 02/11/2015 08:46    ASSESSMENT AND PLAN:  59 year old woman with history of hypertension, hyperlipidemia, chronic back pain, presenting with 2 hours of jaw pain radiating to the chest and left shoulder and EKG changes consistent with inferolateral STEMI.  1.  Inferior STEMI - cath revealed 80%  mid LAD, 70% D1, occluded mid LCX, 50% mid RCA, 70% distal RCA, acute occlusion of prox RCA s/p DES to prox RCA.  This was complicated by hypotension requiring pressor support. Complains of left arm pain post MI but EKG with no ST elevation.  Continue to cycle troponin.  Continue ASA/Brilinta/IV Heparin/high dose statin.  No BB due to hypotension.   2.  Cardiogenic shock with ? RV infarct - BP stable on Dopamine and IABP.  UOP good.  Central line placed so will monitor CVP.  IVF hydration to keep CVP>10.  Continue IABP while weaning dopamine.  Wean dopamine to keep SBP>110mmHg.  Check 2D echo today.  3.  Hyperglycemia with no history of DM.  Check HbA1C.  Cover with SS Insulin. 4.  HTN - BP meds on hold due to hypotension. 5.  Dyslipidemia - LDL 76.  Increase Crestor to 40mg  daily  The patient is critically ill requiring pressors and IABP for BP support.  I have spent 45 minutes in direct patient care.  Sueanne Margarita, MD  02/11/2015  9:46 AM

## 2015-02-11 NOTE — ED Provider Notes (Signed)
CSN: [id number]     Arrival date & time 02/11/15  0218 History  This chart was scribed for Linton Flemings, MD by Julien Nordmann, ED Scribe. This patient was seen in room RESB/RESB and the patient's care was started at 2:36 AM.    Chief Complaint  Patient presents with  . Chest Pain  . Jaw Pain      The history is provided by the patient. No language interpreter was used.   HPI Comments: Catherine Craig is a 59 y.o. female who presents to the Emergency Department complaining of sudden onset, gradual worsening chest pain onset this evening. She states she has been having jaw pain all day long, severe at times.  Patient with 2 hours of chest pain with radiation down her left arm which prompted her to come to the emergency departmen. Pt has been having associated nausea, shortness of breath, and diaphoresis. She says she has left sided chest pain that radiates down her left arm. Pt is a smoker and reports she smokes one pack a day. Pt has a paternal hx of heart problems.  Patient has history of hypertension, hyperlipidemia.  She has been out of her Crestor for some time.  Past Medical History  Diagnosis Date  . ADHD 02/10/2007  . ALLERGIC RHINITIS 12/15/2006  . BASAL CELL CARCINOMA, HX OF 07/18/2009  . FIBROMYALGIA 12/15/2006  . GERD 12/15/2006  . HYPERLIPIDEMIA 12/15/2006  . HYPERTENSION 12/15/2006  . MENOPAUSAL SYNDROME 12/15/2006  . OSTEOARTHRITIS 02/11/2008  . Renal colic 9/35/7017  . VAGINITIS, ATROPHIC 01/05/2007  . Histoplasmosis with retinitis   . Heart murmur     earlier-  not heard now  . Anxiety   . Shortness of breath   . Cancer     left side of nose  . IBS (irritable bowel syndrome)   . Anal fissure   . Thoracic outlet syndrome   . Lipoma     on chest  . Fibromyalgia    Past Surgical History  Procedure Laterality Date  . Appendectomy    . Tonsillectomy    . Dilation and curettage of uterus    . Tonsillectomy    . Eye surgery      Lazer to right x 6  . Spine surgery   04/16/2012   Family History  Problem Relation Age of Onset  . COPD Neg Hx     family  . Heart disease Neg Hx     family  . ADD / ADHD Neg Hx     family   Social History  Substance Use Topics  . Smoking status: Current Every Day Smoker -- 1.00 packs/day for 30 years    Types: Cigarettes  . Smokeless tobacco: Never Used     Comment: using electronic cigarette as of 03/2012  . Alcohol Use: Yes     Comment: social   OB History    No data available     Review of Systems  Constitutional: Positive for diaphoresis.  Respiratory: Positive for shortness of breath.   Cardiovascular: Positive for chest pain.  Gastrointestinal: Positive for nausea.  All other systems reviewed and are negative.     Allergies  Ivp dye; Lyrica; and Chantix  Home Medications   Prior to Admission medications   Medication Sig Start Date End Date Taking? Authorizing Provider  amphetamine-dextroamphetamine (ADDERALL) 10 MG tablet Take 1 tablet (10 mg total) by mouth 2 (two) times daily. 11/02/14   Marletta Lor, MD  amphetamine-dextroamphetamine (ADDERALL) 10 MG tablet Take  1 tablet (10 mg total) by mouth 2 (two) times daily. 11/02/14   Marletta Lor, MD  amphetamine-dextroamphetamine (ADDERALL) 10 MG tablet Take 1 tablet (10 mg total) by mouth 2 (two) times daily. 11/02/14   Marletta Lor, MD  aspirin 325 MG tablet Take 325 mg by mouth at bedtime.     Historical Provider, MD  b complex vitamins tablet Take 1 tablet by mouth at bedtime.    Historical Provider, MD  Calcium Citrate-Vitamin D (CITRACAL + D PO) Take 1 tablet by mouth 2 (two) times daily.    Historical Provider, MD  Coenzyme Q10 (COQ-10) 200 MG CAPS Take 1 tablet by mouth 2 (two) times daily.    Historical Provider, MD  cyclobenzaprine (FLEXERIL) 10 MG tablet TAKE 1 TABLET BY MOUTH 3 TIMES A DAY AS NEEDED FOR MUSCLE SPASM 10/20/14   Marletta Lor, MD  fexofenadine-pseudoephedrine (ALLEGRA-D) 60-120 MG per tablet Take 1  tablet by mouth daily as needed. For allergies    Historical Provider, MD  furosemide (LASIX) 20 MG tablet Take 20 mg by mouth daily as needed. For swelling    Historical Provider, MD  gabapentin (NEURONTIN) 400 MG capsule TAKE ONE CAPSULE AT BEDTIME AS NEEDED 12/13/14   Marletta Lor, MD  HYDROcodone-acetaminophen (NORCO/VICODIN) 5-325 MG per tablet Take 1 tablet by mouth every 6 (six) hours as needed for moderate pain. 11/02/14   Marletta Lor, MD  KRILL OIL PO Take 1 tablet by mouth every morning.    Historical Provider, MD  MAGNESIUM CITRATE PO Take by mouth.    Historical Provider, MD  Multiple Vitamin (MULTIVITAMIN WITH MINERALS) TABS Take 0.5 tablets by mouth 2 (two) times daily.    Historical Provider, MD  naproxen sodium (ANAPROX) 220 MG tablet Take 220 mg by mouth 2 (two) times daily with a meal.    Historical Provider, MD  nystatin (MYCOSTATIN) 100000 UNIT/ML suspension  08/30/13   Historical Provider, MD  rosuvastatin (CRESTOR) 10 MG tablet Take 1 tablet (10 mg total) by mouth daily. 11/02/14   Marletta Lor, MD  traMADol (ULTRAM) 50 MG tablet Take 1 tablet (50 mg total) by mouth every 6 (six) hours as needed. 11/02/14   Marletta Lor, MD  triamterene-hydrochlorothiazide (MAXZIDE) 75-50 MG per tablet TAKE 1/2 TABLET BY MOUTH EVERY DAY 09/20/14   Marletta Lor, MD  vitamin C (ASCORBIC ACID) 500 MG tablet Take 500 mg by mouth 2 (two) times daily.    Historical Provider, MD   Triage vitals: BP 123/78 mmHg  Pulse 101  Temp(Src) 97.8 F (36.6 C) (Oral)  Resp 20  SpO2 99% Physical Exam  Constitutional: She is oriented to person, place, and time. She appears well-developed and well-nourished. She appears distressed (uncomfortable appearing).  HENT:  Head: Normocephalic and atraumatic.  Nose: Nose normal.  Mouth/Throat: Oropharynx is clear and moist.  Eyes: Conjunctivae and EOM are normal. Pupils are equal, round, and reactive to light.  Neck: Normal range of  motion. Neck supple. No JVD present. No tracheal deviation present. No thyromegaly present.  Cardiovascular: Normal rate, regular rhythm, normal heart sounds and intact distal pulses.  Exam reveals no gallop and no friction rub.   No murmur heard. Pulmonary/Chest: Effort normal and breath sounds normal. No stridor. No respiratory distress. She has no wheezes. She has no rales. She exhibits no tenderness.  Abdominal: Soft. Bowel sounds are normal. She exhibits no distension and no mass. There is no tenderness. There is no rebound and  no guarding.  Musculoskeletal: Normal range of motion. She exhibits no edema or tenderness.  Lymphadenopathy:    She has no cervical adenopathy.  Neurological: She is alert and oriented to person, place, and time. She displays normal reflexes. She exhibits normal muscle tone. Coordination normal.  Skin: Skin is warm and dry. No rash noted. No erythema. No pallor.  Psychiatric: She has a normal mood and affect. Her behavior is normal. Judgment and thought content normal.  Nursing note and vitals reviewed.   ED Course  Procedures  DIAGNOSTIC STUDIES: Oxygen Saturation is 99% on RA, normal by my interpretation.  COORDINATION OF CARE:  2:40 AM Discussed treatment plan  with pt at bedside and pt agreed to plan.  Labs Review Labs Reviewed  BASIC METABOLIC PANEL - Abnormal; Notable for the following:    Glucose, Bld 131 (*)    Creatinine, Ser 1.07 (*)    GFR calc non Af Amer 55 (*)    All other components within normal limits  CBC - Abnormal; Notable for the following:    WBC 15.2 (*)    RDW 15.9 (*)    All other components within normal limits  I-STAT TROPOININ, ED - Abnormal; Notable for the following:    Troponin i, poc 0.22 (*)    All other components within normal limits  APTT  PROTIME-INR  CBC  HEPARIN LEVEL (UNFRACTIONATED)    Imaging Review Dg Chest 2 View  02/11/2015   CLINICAL DATA:  Left-sided chest pain radiating to the left arm. Jaw pain.  Shortness of breath.  EXAM: CHEST  2 VIEW  COMPARISON:  10/28/2011  FINDINGS: The cardiomediastinal contours are normal. Mild bronchial thickening in the upper lobes, right greater than left. Pulmonary vasculature is normal. No consolidation, pleural effusion, or pneumothorax. Posterior fusion with rods and intrapedicular screws, mid-lower thoracic and lumbar spine. No acute osseous abnormalities are seen.  IMPRESSION: Mild upper lobe bronchial thickening.  Otherwise no acute process.   Electronically Signed   By: Jeb Levering M.D.   On: 02/11/2015 02:46   I have personally reviewed and evaluated these images and lab results as part of my medical decision-making.   EKG Interpretation   Date/Time:  Saturday February 11 2015 02:30:38 EDT Ventricular Rate:  99 PR Interval:  153 QRS Duration: 146 QT Interval:  378 QTC Calculation: 485 R Axis:   131 Text Interpretation:  Sinus rhythm RBBB and LPFB Inferior infarct, acute  (RCA) Anterolateral infarct, acute Probable RV involvement, suggest  recording right precordial leads Confirmed by Andreanna Mikolajczak  MD, Jaki Hammerschmidt ([id number]) on  02/11/2015 2:46:24 AM     CRITICAL CARE Performed by: Kalman Drape Total critical care time: 30 min Critical care time was exclusive of separately billable procedures and treating other patients. Critical care was necessary to treat or prevent imminent or life-threatening deterioration. Critical care was time spent personally by me on the following activities: development of treatment plan with patient and/or surrogate as well as nursing, discussions with consultants, evaluation of patient's response to treatment, examination of patient, obtaining history from patient or surrogate, ordering and performing treatments and interventions, ordering and review of laboratory studies, ordering and review of radiographic studies, pulse oximetry and re-evaluation of patient's condition.  MDM   Final diagnoses:  Acute ST elevation myocardial  infarction (STEMI) involving other coronary artery of inferior wall    I personally performed the services described in this documentation, which was scribed in my presence. The recorded information has been reviewed and is  accurate.   Pt with inferior ST elevation, code STEMI initiated.  Pt give nitroglycerin per protocol, not by my orders.  When I noted that it had been ordered under protocol I attempted to stop it from being given-pt with mild drop in BP after 1 ntg.  Fluid bolus ordered.  Case d/w Dr Tamala Julian who accepted patient in transfer.  Pt requesting to be transferred to [place] elevation, I advised the patient that would not be prudent as she was having ongoing ischemia, and given risk for fatal arrhythmias/cardiac arrest or permanent cardiac damage if not emergently evaluated in the cath lab.  Linton Flemings, MD 02/11/15 [phone]

## 2015-02-11 NOTE — Progress Notes (Signed)
CRITICAL VALUE ALERT  Critical value received:  Troponin 40.55  Date of notification:  02/11/2015  Time of notification:  1900   Critical value read back:Yes.    Nurse who received alert:  Archie Endo, RN  MD notified (1st page):  Arvid Right, MD  Time of first page:  1930  Responding MD:  Arvid Right, MD  Time MD responded:  [phone]

## 2015-02-11 NOTE — ED Notes (Signed)
Pt has arrived via [facility] from [facility]

## 2015-02-11 NOTE — Progress Notes (Signed)
ANTICOAGULATION CONSULT NOTE - Follow Up Consult  Pharmacy Consult for heparin  Indication: IABP  Allergies  Allergen Reactions  . Bee Venom Swelling  . Ivp Dye [Iodinated Diagnostic Agents] Hives    Fluroscan eye dye per patient  . Lyrica [Pregabalin] Swelling  . Chantix [Varenicline Tartrate] Nausea And Vomiting    Patient Measurements: Height: 5\' 3"  (160 cm) Weight: 158 lb 1.1 oz (71.7 kg) IBW/kg (Calculated) : 52.4 Heparin Dosing Weight: 70kg  Vital Signs: Temp: 99.4 F (37.4 C) (08/27 1532) Temp Source: Oral (08/27 1532) BP: 116/74 mmHg (08/27 1600) Pulse Rate: 91 (08/27 1615)  Labs:  Recent Labs  02/11/15 0239 02/11/15 0610 02/11/15 1148 02/11/15 1411  HGB 13.5 12.1  --   --   HCT 41.0 36.0  --   --   PLT 289 311  --   --   LABPROT  --  21.5*  --   --   INR  --  1.88*  --   --   HEPARINUNFRC  --   --   --  0.10*  CREATININE 1.07* 0.81  --   --   TROPONINI  --  7.15* 33.77*  --     Estimated Creatinine Clearance: 69.2 mL/min (by C-G formula based on Cr of 0.81).   Medications:  Scheduled:  . aspirin  324 mg Oral Once  . aspirin  81 mg Oral Daily  . rosuvastatin  40 mg Oral q1800  . sodium chloride  3 mL Intravenous Q12H  . ticagrelor  90 mg Oral BID   Infusions:  . sodium chloride 100 mL (02/11/15 1030)  . DOPamine 14 mcg/kg/min (02/11/15 1400)  . heparin 1,050 Units/hr (02/11/15 1500)    Assessment: 59yo female c/o left-sided CP radiating to left arm associated w/ jaw pain and SOB, called as code STEMI, IABP placed in cath lab and now on heparin. Initial heparin level is 0.1  Goal of Therapy:  Heparin level 0.2-0.5 units/ml Monitor platelets by anticoagulation protocol: Yes   Plan:  -Increase heparin to 1050 units/hr -Heparin level in 6 hours and daily wth CBC daily  Hildred Laser, Pharm D 02/11/2015 4:35 PM

## 2015-02-11 NOTE — Significant Event (Signed)
Pt. Awakened from sleep with 9/10 chest, jaw and left arm pressure and squeezing.  EKG ordered and 2mg  morphine given.  Dr. Tamala Julian made aware.

## 2015-02-11 NOTE — ED Notes (Signed)
Bed: KY70 Expected date:  Expected time:  Means of arrival:  Comments: t1

## 2015-02-11 NOTE — ED Notes (Signed)
Pt from home c/o left sided chest pain that radiates to left arm. She reports jaw pain all day. She reports shortness of breath.

## 2015-02-11 NOTE — Progress Notes (Signed)
[facility] Progress Note Patient Name: Catherine Craig DOB: [birth year] MRN: [id number]   Date of Service  02/11/2015  HPI/Events of Note  Patient is 66 F with PMH of HTN/HLD presenting with jaw pain at [facility] - transferred to [facility] for emergent cath following findings of a STEMI.  Now s/p PCI.  On DA gtt for BP support.  Patient is alert in no resp distress.  eICU Interventions  Plan of care per primary cardiology team PCCM to place CVL for continued pressor support     Intervention Category Evaluation Type: New Patient Evaluation  DETERDING,ELIZABETH 02/11/2015, 6:44 AM

## 2015-02-11 NOTE — Progress Notes (Signed)
ANTICOAGULATION CONSULT NOTE - Initial Consult  Pharmacy Consult for heparin Indication: IABP  Allergies  Allergen Reactions  . Ivp Dye [Iodinated Diagnostic Agents] Hives    Fluroscan eye dye per patient  . Lyrica [Pregabalin] Swelling  . Chantix [Varenicline Tartrate] Nausea And Vomiting    Patient Measurements: Height: 5\' 3"  (160 cm) Weight: 158 lb 1.1 oz (71.7 kg) IBW/kg (Calculated) : 52.4 Heparin Dosing Weight: 70kg  Vital Signs: Temp: 97.8 F (36.6 C) (08/27 0224) Temp Source: Oral (08/27 0224) BP: 89/64 mmHg (08/27 0248) Pulse Rate: 99 (08/27 0248)  Labs:  Recent Labs  02/11/15 0239  HGB 13.5  HCT 41.0  PLT 289  CREATININE 1.07*    Estimated Creatinine Clearance: 52.4 mL/min (by C-G formula based on Cr of 1.07).   Medical History: Past Medical History  Diagnosis Date  . ADHD 02/10/2007  . ALLERGIC RHINITIS 12/15/2006  . BASAL CELL CARCINOMA, HX OF 07/18/2009  . FIBROMYALGIA 12/15/2006  . GERD 12/15/2006  . HYPERLIPIDEMIA 12/15/2006  . HYPERTENSION 12/15/2006  . MENOPAUSAL SYNDROME 12/15/2006  . OSTEOARTHRITIS 02/11/2008  . Renal colic 1/88/4166  . VAGINITIS, ATROPHIC 01/05/2007  . Histoplasmosis with retinitis   . Heart murmur     earlier-  not heard now  . Anxiety   . Shortness of breath   . Cancer     left side of nose  . IBS (irritable bowel syndrome)   . Anal fissure   . Thoracic outlet syndrome   . Lipoma     on chest  . Fibromyalgia     Medications:  Prescriptions prior to admission  Medication Sig Dispense Refill Last Dose  . amphetamine-dextroamphetamine (ADDERALL) 10 MG tablet Take 1 tablet (10 mg total) by mouth 2 (two) times daily. 60 tablet 0   . amphetamine-dextroamphetamine (ADDERALL) 10 MG tablet Take 1 tablet (10 mg total) by mouth 2 (two) times daily. 60 tablet 0   . amphetamine-dextroamphetamine (ADDERALL) 10 MG tablet Take 1 tablet (10 mg total) by mouth 2 (two) times daily. 60 tablet 0   . aspirin 325 MG tablet Take 325 mg by  mouth at bedtime.    Not Taking  . b complex vitamins tablet Take 1 tablet by mouth at bedtime.   Taking  . Calcium Citrate-Vitamin D (CITRACAL + D PO) Take 1 tablet by mouth 2 (two) times daily.   Taking  . Coenzyme Q10 (COQ-10) 200 MG CAPS Take 1 tablet by mouth 2 (two) times daily.   Taking  . cyclobenzaprine (FLEXERIL) 10 MG tablet TAKE 1 TABLET BY MOUTH 3 TIMES A DAY AS NEEDED FOR MUSCLE SPASM 90 tablet 2 Taking  . fexofenadine-pseudoephedrine (ALLEGRA-D) 60-120 MG per tablet Take 1 tablet by mouth daily as needed. For allergies   Taking  . furosemide (LASIX) 20 MG tablet Take 20 mg by mouth daily as needed. For swelling   Taking  . gabapentin (NEURONTIN) 400 MG capsule TAKE ONE CAPSULE AT BEDTIME AS NEEDED 30 capsule 5   . HYDROcodone-acetaminophen (NORCO/VICODIN) 5-325 MG per tablet Take 1 tablet by mouth every 6 (six) hours as needed for moderate pain. 60 tablet 0   . KRILL OIL PO Take 1 tablet by mouth every morning.   Taking  . MAGNESIUM CITRATE PO Take by mouth.   Taking  . Multiple Vitamin (MULTIVITAMIN WITH MINERALS) TABS Take 0.5 tablets by mouth 2 (two) times daily.   Taking  . naproxen sodium (ANAPROX) 220 MG tablet Take 220 mg by mouth 2 (two) times daily  with a meal.   Taking  . nystatin (MYCOSTATIN) 100000 UNIT/ML suspension    Taking  . rosuvastatin (CRESTOR) 10 MG tablet Take 1 tablet (10 mg total) by mouth daily. 90 tablet 1   . traMADol (ULTRAM) 50 MG tablet Take 1 tablet (50 mg total) by mouth every 6 (six) hours as needed. 120 tablet 2   . triamterene-hydrochlorothiazide (MAXZIDE) 75-50 MG per tablet TAKE 1/2 TABLET BY MOUTH EVERY DAY 90 tablet 0 Taking  . vitamin C (ASCORBIC ACID) 500 MG tablet Take 500 mg by mouth 2 (two) times daily.   Taking   Scheduled:  . aspirin  324 mg Oral Once  . aspirin  81 mg Oral Daily  . bivalirudin (ANGIOMAX) infusion 5 mg/mL (Cath Lab,ACS,PCI indication)  1.75 mg/kg/hr Intravenous Once  . heparin  4,000 Units Intravenous STAT  .  metoprolol      . sodium chloride  3 mL Intravenous Q12H  . ticagrelor  90 mg Oral BID   Infusions:  . sodium chloride    . sodium chloride    . bivalirudin (ANGIOMAX) infusion 5 mg/mL Stopped (02/11/15 0437)  . DOPamine 20 mcg/kg/min (02/11/15 0408)  . DOPamine      Assessment: 59yo female c/o left-sided CP radiating to left arm associated w/ jaw pain and SOB, called as code STEMI, IABP placed in cath lab, to begin heparin.  Goal of Therapy:  Heparin level 0.2-0.5 units/ml Monitor platelets by anticoagulation protocol: Yes   Plan:  Though not charted, heparin 4000 units was ordered pre-cath; will start heparin gtt at 900 units/hr and monitor heparin levels and CBC.  Wynona Neat, PharmD, BCPS  02/11/2015,4:59 AM

## 2015-02-12 ENCOUNTER — Inpatient Hospital Stay (HOSPITAL_COMMUNITY): Payer: BLUE CROSS/BLUE SHIELD

## 2015-02-12 DIAGNOSIS — I213 ST elevation (STEMI) myocardial infarction of unspecified site: Secondary | ICD-10-CM

## 2015-02-12 DIAGNOSIS — I2119 ST elevation (STEMI) myocardial infarction involving other coronary artery of inferior wall: Principal | ICD-10-CM

## 2015-02-12 LAB — BASIC METABOLIC PANEL
ANION GAP: 8 (ref 5–15)
BUN: 6 mg/dL (ref 6–20)
CALCIUM: 9.2 mg/dL (ref 8.9–10.3)
CO2: 22 mmol/L (ref 22–32)
CREATININE: 0.81 mg/dL (ref 0.44–1.00)
Chloride: 107 mmol/L (ref 101–111)
GFR calc Af Amer: >60 mL/min (ref >60)
GLUCOSE: 115 mg/dL — AB (ref 65–99)
Potassium: 3.5 mmol/L (ref 3.5–5.1)
Sodium: 137 mmol/L (ref 135–145)

## 2015-02-12 LAB — CBC
HCT: 31.2 % — ABNORMAL LOW (ref 36.0–46.0)
Hemoglobin: 10.7 g/dL — ABNORMAL LOW (ref 12.0–15.0)
MCH: 27.9 pg (ref 26.0–34.0)
MCHC: 34.3 g/dL (ref 30.0–36.0)
MCV: 81.3 fL (ref 78.0–100.0)
PLATELETS: 210 10*3/uL (ref 150–400)
RBC: 3.84 MIL/uL — ABNORMAL LOW (ref 3.87–5.11)
RDW: 15.8 % — AB (ref 11.5–15.5)
WBC: 13.1 10*3/uL — AB (ref 4.0–10.5)

## 2015-02-12 LAB — HEPARIN LEVEL (UNFRACTIONATED): HEPARIN UNFRACTIONATED: 0.22 [IU]/mL — AB (ref 0.30–0.70)

## 2015-02-12 MED ORDER — POTASSIUM CHLORIDE CRYS ER 20 MEQ PO TBCR
40.0000 meq | EXTENDED_RELEASE_TABLET | Freq: Once | ORAL | Status: AC
  Administered 2015-02-12: 40 meq via ORAL
  Filled 2015-02-12: qty 2

## 2015-02-12 NOTE — Progress Notes (Signed)
Changed IABP 1:2 per Dr. Tamala Julian at bedside.  May put back on 1:1 if pt does not tolerate throughout the night.  Wants pt. To remain on at least 44mcg dopamine for possible IABP removal tomorrow.  VSS will cont. to monitor.

## 2015-02-12 NOTE — Progress Notes (Signed)
SUBJECTIVE:  No complaints this am  OBJECTIVE:   Vitals:   Filed Vitals:   02/12/15 0745 02/12/15 0800 02/12/15 0815 02/12/15 0900  BP:  120/58  115/62  Pulse: 88 89 87   Temp:      TempSrc:      Resp: 13 11 15    Height:      Weight:      SpO2: 100% 100% 100%    I&O's:   Intake/Output Summary (Last 24 hours) at 02/12/15 0950 Last data filed at 02/12/15 0900  Gross per 24 hour  Intake 3053.91 ml  Output   4330 ml  Net -1276.09 ml   TELEMETRY: Reviewed telemetry pt in NSR:     PHYSICAL EXAM General: Well developed, well nourished, in no acute distress Head: Eyes PERRLA, No xanthomas.   Normal cephalic and atramatic  Lungs:   Clear bilaterally to auscultation and percussion. Heart:   HRRR S1 S2 Pulses are 2+ & equal. Abdomen: Bowel sounds are positive, abdomen soft and non-tender without masses Extremities:   No clubbing, cyanosis or edema.  DP +1 Neuro: Alert and oriented X 3. Psych:  Good affect, responds appropriately   LABS: Basic Metabolic Panel:  Recent Labs  02/11/15 0610 02/12/15 0815  NA 136 137  K 3.8 3.5  CL 109 107  CO2 20* 22  GLUCOSE 172* 115*  BUN 7 6  CREATININE 0.81 0.81  CALCIUM 9.0 9.2   Liver Function Tests: No results for input(s): AST, ALT, ALKPHOS, BILITOT, PROT, ALBUMIN in the last 72 hours. No results for input(s): LIPASE, AMYLASE in the last 72 hours. CBC:  Recent Labs  02/11/15 0610 02/12/15 0400  WBC 16.3* 13.1*  HGB 12.1 10.7*  HCT 36.0 31.2*  MCV 81.6 81.3  PLT 311 210   Cardiac Enzymes:  Recent Labs  02/11/15 0610 02/11/15 1148 02/11/15 1733  TROPONINI 7.15* 33.77* 40.55*   BNP: Invalid input(s): POCBNP D-Dimer: No results for input(s): DDIMER in the last 72 hours. Hemoglobin A1C: No results for input(s): HGBA1C in the last 72 hours. Fasting Lipid Panel:  Recent Labs  02/11/15 0611  CHOL 149  HDL 39*  LDLCALC 76  TRIG 171*  CHOLHDL 3.8   Thyroid Function Tests: No results for input(s):  TSH, T4TOTAL, T3FREE, THYROIDAB in the last 72 hours.  Invalid input(s): FREET3 Anemia Panel: No results for input(s): VITAMINB12, FOLATE, FERRITIN, TIBC, IRON, RETICCTPCT in the last 72 hours. Coag Panel:   Lab Results  Component Value Date   INR 1.88* 02/11/2015    RADIOLOGY: Dg Chest 2 View  02/11/2015   CLINICAL DATA:  Left-sided chest pain radiating to the left arm. Jaw pain. Shortness of breath.  EXAM: CHEST  2 VIEW  COMPARISON:  10/28/2011  FINDINGS: The cardiomediastinal contours are normal. Mild bronchial thickening in the upper lobes, right greater than left. Pulmonary vasculature is normal. No consolidation, pleural effusion, or pneumothorax. Posterior fusion with rods and intrapedicular screws, mid-lower thoracic and lumbar spine. No acute osseous abnormalities are seen.  IMPRESSION: Mild upper lobe bronchial thickening.  Otherwise no acute process.   Electronically Signed   By: Jeb Levering M.D.   On: 02/11/2015 02:46   Dg Chest Port 1 View  02/12/2015   CLINICAL DATA:  Intra aortic balloon pump.  EXAM: PORTABLE CHEST - 1 VIEW  COMPARISON:  One day prior  FINDINGS: Left internal jugular line terminates at the high SVC. Intra aortic balloon pump unchanged in position, with radiopaque portion in the  proximal descending aorta. Patient rotated right. Mild cardiomegaly. Thoracic spine fixation. Mild right hemidiaphragm elevation. No pleural effusion or pneumothorax. No congestive failure. No lobar consolidation.  IMPRESSION: Cardiomegaly with resolution of venous congestion.  Stable support apparatus.   Electronically Signed   By: Abigail Miyamoto M.D.   On: 02/12/2015 08:28   Dg Chest Port 1 View  02/11/2015   CLINICAL DATA:  Respiratory failure, acute myocardial infarction, status post coronary intervention  EXAM: PORTABLE CHEST - 1 VIEW  COMPARISON:  02/11/2015  FINDINGS: Left IJ central line tip at the innominate SVC junction. Radiopaque aortic balloon pump tip in the proximal  descending thoracic aorta. Lower lung volumes evident. Heart is enlarged with mild diffuse vascular congestion pattern. No focal pneumonia, collapse or consolidation. No effusion or pneumothorax. Thoracic fusion hardware evident.  IMPRESSION: Support apparatus as above.  Low volume exam with mild cardiomegaly and vascular congestion   Electronically Signed   By: Jerilynn Mages.  Shick M.D.   On: 02/11/2015 08:46   ASSESSMENT AND PLAN:  59 year old woman with history of hypertension, hyperlipidemia, chronic back pain, presenting with 2 hours of jaw pain radiating to the chest and left shoulder and EKG changes consistent with inferolateral STEMI.  1. Inferior STEMI - cath revealed 80% mid LAD, 70% D1, occluded mid LCX, 50% mid RCA, 70% distal RCA, acute occlusion of prox RCA s/p DES to prox RCA. This was complicated by hypotension requiring pressor support. Complained of left arm pain post MI but improved and EKG with no ST elevation. Arm pain sound musculoskeletal.  Continue to cycle troponin until it trends downward. Continue ASA/Brilinta/IV Heparin/high dose statin. No BB due to hypotension.  2. Cardiogenic shock with ? RV infarct - BP stable on Dopamine and IABP. UOP good. CVP running around 5. IVF hydration to keep CVP>10. Continue IABP while weaning dopamine.Hopefully can wean IABP off tomorrow.  Wean dopamine to keep SBP>163mmHg. Check 2D echo today.  3. Hyperglycemia with no history of DM. Check HbA1C. Cover with SS Insulin. 4. HTN - BP meds on hold due to hypotension. 5. Dyslipidemia - LDL 76. Increase Crestor to 40mg  daily 6.  Hypokalemia - replete   Catherine Margarita, MD  02/12/2015  9:50 AM

## 2015-02-12 NOTE — Progress Notes (Signed)
  Echocardiogram 2D Echocardiogram has been performed.  Catherine Craig 02/12/2015, 3:51 PM

## 2015-02-12 NOTE — Progress Notes (Signed)
ANTICOAGULATION CONSULT NOTE - Follow Up Consult  Pharmacy Consult for Heparin Indication: IABP  Allergies  Allergen Reactions  . Bee Venom Swelling  . Ivp Dye [Iodinated Diagnostic Agents] Hives    Fluroscan eye dye per patient  . Lyrica [Pregabalin] Swelling  . Chantix [Varenicline Tartrate] Nausea And Vomiting    Patient Measurements: Height: 5\' 3"  (160 cm) Weight: 166 lb 0.1 oz (75.3 kg) IBW/kg (Calculated) : 52.4 Heparin Dosing Weight:   Vital Signs: Temp: 98.2 F (36.8 C) (08/28 0734) Temp Source: Oral (08/28 0734) BP: 118/64 mmHg (08/28 0700) Pulse Rate: 88 (08/28 0745)  Labs:  Recent Labs  02/11/15 0239 02/11/15 0610 02/11/15 1148 02/11/15 1411 02/11/15 1733 02/11/15 2130 02/12/15 0400 02/12/15 0407  HGB 13.5 12.1  --   --   --   --  10.7*  --   HCT 41.0 36.0  --   --   --   --  31.2*  --   PLT 289 311  --   --   --   --  210  --   LABPROT  --  21.5*  --   --   --   --   --   --   INR  --  1.88*  --   --   --   --   --   --   HEPARINUNFRC  --   --   --  0.10*  --  0.18*  --  0.22*  CREATININE 1.07* 0.81  --   --   --   --   --   --   TROPONINI  --  7.15* 33.77*  --  40.55*  --   --   --     Estimated Creatinine Clearance: 70.9 mL/min (by C-G formula based on Cr of 0.81).   Medications:  Scheduled:  . aspirin  324 mg Oral Once  . aspirin  81 mg Oral Daily  . nicotine  21 mg Transdermal Daily  . rosuvastatin  40 mg Oral q1800  . sodium chloride  3 mL Intravenous Q12H  . ticagrelor  90 mg Oral BID    Assessment: 59yo female with IABP, on heparin 1150 units/hr.  Heparin level within goal at 0.22.  Hg 10.7 and pltc wnl.  No bleeding noted.  Goal of Therapy:  Heparin level 0.2-0.5 units/ml Monitor platelets by anticoagulation protocol: Yes   Plan:  Continue heparin at current rate Monitor for s/s of bleeding F/U in AM  Gracy Bruins, [facility]

## 2015-02-12 NOTE — Progress Notes (Signed)
Interventional Note   Still requiring IABP and pressor support.  Plan is to wean IABP followed by dopamine. IV fluid to help support BP in setting of probable RV involvement     (although no obvious RV abnormality on echo - by report).  Once stable post MI, goal is to decide whether she needs full revascularization with CABG vs additional PCI.  Needs to be off pump, ambulating, and clinically improved for either.

## 2015-02-13 ENCOUNTER — Encounter (HOSPITAL_COMMUNITY): Payer: Self-pay | Admitting: Interventional Cardiology

## 2015-02-13 ENCOUNTER — Inpatient Hospital Stay (HOSPITAL_COMMUNITY): Payer: BLUE CROSS/BLUE SHIELD

## 2015-02-13 LAB — TROPONIN I: TROPONIN I: 12.77 ng/mL — AB (ref <0.031)

## 2015-02-13 LAB — HEPARIN LEVEL (UNFRACTIONATED): HEPARIN UNFRACTIONATED: 0.22 [IU]/mL — AB (ref 0.30–0.70)

## 2015-02-13 LAB — POCT ACTIVATED CLOTTING TIME
ACTIVATED CLOTTING TIME: 128 s
Activated Clotting Time: 687 seconds

## 2015-02-13 LAB — CBC
HCT: 29.5 % — ABNORMAL LOW (ref 36.0–46.0)
HEMOGLOBIN: 10.1 g/dL — AB (ref 12.0–15.0)
MCH: 27.6 pg (ref 26.0–34.0)
MCHC: 34.2 g/dL (ref 30.0–36.0)
MCV: 80.6 fL (ref 78.0–100.0)
PLATELETS: 188 10*3/uL (ref 150–400)
RBC: 3.66 MIL/uL — AB (ref 3.87–5.11)
RDW: 15.8 % — ABNORMAL HIGH (ref 11.5–15.5)
WBC: 10.7 10*3/uL — AB (ref 4.0–10.5)

## 2015-02-13 LAB — BASIC METABOLIC PANEL
ANION GAP: 5 (ref 5–15)
BUN: 6 mg/dL (ref 6–20)
CALCIUM: 9.2 mg/dL (ref 8.9–10.3)
CO2: 21 mmol/L — ABNORMAL LOW (ref 22–32)
CREATININE: 0.86 mg/dL (ref 0.44–1.00)
Chloride: 112 mmol/L — ABNORMAL HIGH (ref 101–111)
Glucose, Bld: 117 mg/dL — ABNORMAL HIGH (ref 65–99)
Potassium: 3.3 mmol/L — ABNORMAL LOW (ref 3.5–5.1)
Sodium: 138 mmol/L (ref 135–145)

## 2015-02-13 LAB — MAGNESIUM: MAGNESIUM: 1.5 mg/dL — AB (ref 1.7–2.4)

## 2015-02-13 LAB — MRSA PCR SCREENING: MRSA by PCR: POSITIVE — AB

## 2015-02-13 LAB — HEMOGLOBIN A1C
Hgb A1c MFr Bld: 5.3 % (ref 4.8–5.6)
Mean Plasma Glucose: 105 mg/dL

## 2015-02-13 MED ORDER — MAGNESIUM SULFATE 50 % IJ SOLN
3.0000 g | Freq: Once | INTRAVENOUS | Status: AC
  Administered 2015-02-13: 3 g via INTRAVENOUS
  Filled 2015-02-13: qty 6

## 2015-02-13 MED ORDER — POTASSIUM CHLORIDE CRYS ER 20 MEQ PO TBCR
40.0000 meq | EXTENDED_RELEASE_TABLET | Freq: Once | ORAL | Status: AC
  Administered 2015-02-13: 40 meq via ORAL
  Filled 2015-02-13: qty 2

## 2015-02-13 MED ORDER — LIDOCAINE HCL (PF) 1 % IJ SOLN
INTRAMUSCULAR | Status: AC
  Filled 2015-02-13: qty 5

## 2015-02-13 MED ORDER — CHLORHEXIDINE GLUCONATE CLOTH 2 % EX PADS
6.0000 | MEDICATED_PAD | Freq: Every day | CUTANEOUS | Status: DC
  Administered 2015-02-14 – 2015-02-16 (×3): 6 via TOPICAL

## 2015-02-13 MED ORDER — MUPIROCIN 2 % EX OINT
1.0000 "application " | TOPICAL_OINTMENT | Freq: Two times a day (BID) | CUTANEOUS | Status: DC
  Administered 2015-02-13 – 2015-02-17 (×8): 1 via NASAL
  Filled 2015-02-13: qty 22

## 2015-02-13 MED ORDER — HEPARIN (PORCINE) IN NACL 100-0.45 UNIT/ML-% IJ SOLN
1150.0000 [IU]/h | INTRAMUSCULAR | Status: DC
  Administered 2015-02-14 (×2): 1150 [IU]/h via INTRAVENOUS
  Filled 2015-02-13 (×2): qty 250

## 2015-02-13 MED ORDER — MORPHINE SULFATE (PF) 2 MG/ML IV SOLN
2.0000 mg | INTRAVENOUS | Status: DC | PRN
  Administered 2015-02-13 – 2015-02-16 (×3): 2 mg via INTRAVENOUS
  Filled 2015-02-13 (×3): qty 1

## 2015-02-13 MED ORDER — POTASSIUM CHLORIDE CRYS ER 20 MEQ PO TBCR
40.0000 meq | EXTENDED_RELEASE_TABLET | Freq: Every day | ORAL | Status: DC
  Administered 2015-02-14 – 2015-02-15 (×2): 40 meq via ORAL
  Filled 2015-02-13 (×2): qty 2

## 2015-02-13 MED FILL — Lidocaine HCl Local Preservative Free (PF) Inj 1%: INTRAMUSCULAR | Qty: 30 | Status: AC

## 2015-02-13 MED FILL — Heparin Sodium (Porcine) 2 Unit/ML in Sodium Chloride 0.9%: INTRAMUSCULAR | Qty: 2000 | Status: AC

## 2015-02-13 NOTE — Progress Notes (Signed)
Per d/w MD, decrease IVF to 75cc/hr. Suhailah Kwan PA-C

## 2015-02-13 NOTE — Progress Notes (Signed)
ANTICOAGULATION CONSULT NOTE - Follow Up Consult  Pharmacy Consult for Heparin Indication: IABP  Allergies  Allergen Reactions  . Adderall [Amphetamine-Dextroamphetamine] Other (See Comments)    High heart rate, stops taking for surgeries  . Bee Venom Swelling  . Ivp Dye [Iodinated Diagnostic Agents] Hives    Fluroscan eye dye per patient  . Lyrica [Pregabalin] Swelling  . Chantix [Varenicline Tartrate] Nausea And Vomiting    Patient Measurements: Height: 5\' 3"  (160 cm) Weight: 167 lb 8.8 oz (76 kg) IBW/kg (Calculated) : 52.4 Heparin Dosing Weight:   Vital Signs: Temp: 99 F (37.2 C) (08/29 0800) Temp Source: Oral (08/29 0400) BP: 138/92 mmHg (08/29 0900) Pulse Rate: 107 (08/29 0900)  Labs:  Recent Labs  02/11/15 0239 02/11/15 0610 02/11/15 1148  02/11/15 1733 02/11/15 2130 02/12/15 0400 02/12/15 0407 02/12/15 0815 02/13/15 0532  HGB 13.5 12.1  --   --   --   --  10.7*  --   --  10.1*  HCT 41.0 36.0  --   --   --   --  31.2*  --   --  29.5*  PLT 289 311  --   --   --   --  210  --   --  188  LABPROT  --  21.5*  --   --   --   --   --   --   --   --   INR  --  1.88*  --   --   --   --   --   --   --   --   HEPARINUNFRC  --   --   --   < >  --  0.18*  --  0.22*  --  0.22*  CREATININE 1.07* 0.81  --   --   --   --   --   --  0.81  --   TROPONINI  --  7.15* 33.77*  --  40.55*  --   --   --   --   --   < > = values in this interval not displayed.  Estimated Creatinine Clearance: 71.2 mL/min (by C-G formula based on Cr of 0.81).   Medications:  Scheduled:  . aspirin  324 mg Oral Once  . aspirin  81 mg Oral Daily  . nicotine  21 mg Transdermal Daily  . rosuvastatin  40 mg Oral q1800  . sodium chloride  3 mL Intravenous Q12H  . ticagrelor  90 mg Oral BID    Assessment: 59yo female with IABP, on heparin 1150 units/hr.  Heparin level remains at 0.22 this AM, within target goal.  Hg 10.1 and pltc wnl but decreased to 188K.  No bleeding noted.   Goal of  Therapy:  Heparin level 0.2-0.5 units/ml Monitor platelets by anticoagulation protocol: Yes   Plan:  Continue heparin at current rate 1150 units/hr. Monitor for s/s of bleeding Daily heparin level and CBC.   Thank you for allowing pharmacy to be part of this patients care team. Nicole Cella, RPh Clinical Pharmacist Pager: [phone] 02/13/2015 9:22 AM

## 2015-02-13 NOTE — Progress Notes (Signed)
Patient: Catherine Craig / Admit Date: 02/11/2015 / Date of Encounter: 02/13/2015, 10:07 AM   Subjective: Feeling good, no complaints. No CP or SOB.   Objective: Telemetry: NSR, brief run of 6 beat NSVT this AM Physical Exam: Blood pressure 138/92, pulse 107, temperature 99 F (37.2 C), temperature source Oral, resp. rate 21, height 5\' 3"  (1.6 m), weight 167 lb 8.8 oz (76 kg), SpO2 93 %. General: Well developed, well nourished WF in no acute distress Head: Normocephalic, atraumatic, sclera non-icteric, no xanthomas, nares are without discharge. Neck: JVP not elevated. Lungs: Clear bilaterally to auscultation without wheezes, rales, or rhonchi. Breathing is unlabored. Heart: RRR mildly elevated rate S1 S2 without murmurs, rubs, or gallops.  Abdomen: Soft, non-tender, non-distended with normoactive bowel sounds. No rebound/guarding. Extremities: No clubbing or cyanosis. No edema. Distal pedal pulses are 2+ and equal bilaterally. R groin cath site without ecchymosis or hematoma Neuro: Alert and oriented X 3. Moves all extremities spontaneously. Psych:  Responds to questions appropriately with a normal affect.   Intake/Output Summary (Last 24 hours) at 02/13/15 1007 Last data filed at 02/13/15 0900  Gross per 24 hour  Intake 4344.95 ml  Output   3925 ml  Net 419.95 ml    Inpatient Medications:  . aspirin  324 mg Oral Once  . aspirin  81 mg Oral Daily  . nicotine  21 mg Transdermal Daily  . rosuvastatin  40 mg Oral q1800  . sodium chloride  3 mL Intravenous Q12H  . ticagrelor  90 mg Oral BID   Infusions:  . sodium chloride 150 mL/hr at 02/13/15 0400  . DOPamine Stopped (02/12/15 2350)  . heparin 1,150 Units/hr (02/13/15 0600)    Labs:  Recent Labs  02/11/15 0610 02/12/15 0815  NA 136 137  K 3.8 3.5  CL 109 107  CO2 20* 22  GLUCOSE 172* 115*  BUN 7 6  CREATININE 0.81 0.81  CALCIUM 9.0 9.2   No results for input(s): AST, ALT, ALKPHOS, BILITOT, PROT, ALBUMIN in the  last 72 hours.  Recent Labs  02/12/15 0400 02/13/15 0532  WBC 13.1* 10.7*  HGB 10.7* 10.1*  HCT 31.2* 29.5*  MCV 81.3 80.6  PLT 210 188    Recent Labs  02/11/15 0610 02/11/15 1148 02/11/15 1733  TROPONINI 7.15* 33.77* 40.55*   Invalid input(s): POCBNP  Recent Labs  02/11/15 0611  HGBA1C 5.3     Radiology/Studies:  Dg Chest 2 View  02/11/2015   CLINICAL DATA:  Left-sided chest pain radiating to the left arm. Jaw pain. Shortness of breath.  EXAM: CHEST  2 VIEW  COMPARISON:  10/28/2011  FINDINGS: The cardiomediastinal contours are normal. Mild bronchial thickening in the upper lobes, right greater than left. Pulmonary vasculature is normal. No consolidation, pleural effusion, or pneumothorax. Posterior fusion with rods and intrapedicular screws, mid-lower thoracic and lumbar spine. No acute osseous abnormalities are seen.  IMPRESSION: Mild upper lobe bronchial thickening.  Otherwise no acute process.   Electronically Signed   By: Jeb Levering M.D.   On: 02/11/2015 02:46   Dg Chest Port 1 View  02/13/2015   CLINICAL DATA:  Encounter for management of intra-aortic balloon pump.  EXAM: PORTABLE CHEST - 1 VIEW  COMPARISON:  02/12/2015  FINDINGS: Stable position of the aortic balloon pump tip in the proximal descending thoracic aorta. Left central line tip in the upper SVC region and unchanged. Again noted is surgical hardware in the thoracic spine. Negative for a pneumothorax. Heart size is stable.  Slightly increased interstitial markings in the right lung. Left lung is clear.  IMPRESSION: Stable support apparatuses as described.  Slightly increased interstitial densities in the right lung. Findings could represent mild asymmetric edema.   Electronically Signed   By: Markus Daft M.D.   On: 02/13/2015 07:45   Dg Chest Port 1 View  02/12/2015   CLINICAL DATA:  Intra aortic balloon pump.  EXAM: PORTABLE CHEST - 1 VIEW  COMPARISON:  One day prior  FINDINGS: Left internal jugular line  terminates at the high SVC. Intra aortic balloon pump unchanged in position, with radiopaque portion in the proximal descending aorta. Patient rotated right. Mild cardiomegaly. Thoracic spine fixation. Mild right hemidiaphragm elevation. No pleural effusion or pneumothorax. No congestive failure. No lobar consolidation.  IMPRESSION: Cardiomegaly with resolution of venous congestion.  Stable support apparatus.   Electronically Signed   By: Abigail Miyamoto M.D.   On: 02/12/2015 08:28   Dg Chest Port 1 View  02/11/2015   CLINICAL DATA:  Respiratory failure, acute myocardial infarction, status post coronary intervention  EXAM: PORTABLE CHEST - 1 VIEW  COMPARISON:  02/11/2015  FINDINGS: Left IJ central line tip at the innominate SVC junction. Radiopaque aortic balloon pump tip in the proximal descending thoracic aorta. Lower lung volumes evident. Heart is enlarged with mild diffuse vascular congestion pattern. No focal pneumonia, collapse or consolidation. No effusion or pneumothorax. Thoracic fusion hardware evident.  IMPRESSION: Support apparatus as above.  Low volume exam with mild cardiomegaly and vascular congestion   Electronically Signed   By: Jerilynn Mages.  Shick M.D.   On: 02/11/2015 08:46     Assessment and Plan  59 year old woman with history of hypertension, hyperlipidemia, chronic back pain, presenting with 2 hours of jaw pain radiating to the chest and left shoulder and EKG changes consistent with inferolateral STEMI.   1. Inferior STEMI - cath revealed 80% mid LAD, 70% D1, occluded mid LCX, 50% mid RCA, 70% distal RCA, acute occlusion of prox RCA s/p DES to prox RCA.This was complicated by hypotension requiring pressor support. Complained of left arm pain post MI but improved and EKG with no ST elevation.Arm pain sounded musculoskeletal - now resolved. Last troponin was still trending up so will order repeat. Continue ASA/Brilinta/IV Heparin/high dose statin. May be able to add BB after we wean IABP.Per  interventional note, once she is stable post MI, goal is to decide whether she needs full revascularization with CABG vs additional PCI.  2. Cardiogenic shock with ? RV infarct - BP stable on IABP. Dopamine has been weaned off. 2D echo 02/12/15: EF 60-65%, grade 2 DD, high ventricular filling pressure, mild HK of mid-inferoseptal and mid inferior myocardium, mild AI/MR, mod TR, mildly reduced RV function. UOP brisk. CVP measured at 5 this AM, thus she is on IVF hydration to keep CVP>10 - may need to d/c soon given high ventricular filling pressures on echo and rising weight. Will discuss weaning of IABP with MD.  3. Hyperglycemia - likely due to stress of MI, A1C 5.3.   4. HTN - BP meds on hold due to hypotension.  5. Dyslipidemia - now on high dose statin.  6. Hypokalemia - f/u BMET this AM.  7. NSVT - 6 beats this AM. Check BMET, Mg. Addendum: K 3.3, Mg 1.5. Will give 3gm mag sulfate. Will give 17meq KCl now and repeat in several hours. Tomorrow start KCl 48meq daily. F/u Mg level in AM to determine if daily MagOx is needed.  8. Tobacco  abuse - counseled regarding cessation.  Signed, Melina Copa PA-C Pager: [phone]   Patient seen and examined. Agree with assessment and plan. I have reduced IABP now to 1:3.  Hemodynamics initially remain stable.  Will hold heparin for 2 hrs, check ACT and DC IABP as long as remains stable.  Cath findings reviewed and will review angios. IF BP stable add additional meds for concomitant CAD and ultimately decide RX with PCI vcs CABG. Counseled smoking cessation.    Troy Sine, MD, [facility] 02/13/2015 12:47 PM

## 2015-02-14 DIAGNOSIS — E785 Hyperlipidemia, unspecified: Secondary | ICD-10-CM

## 2015-02-14 DIAGNOSIS — I959 Hypotension, unspecified: Secondary | ICD-10-CM

## 2015-02-14 DIAGNOSIS — I9589 Other hypotension: Secondary | ICD-10-CM

## 2015-02-14 LAB — CBC
HCT: 32 % — ABNORMAL LOW (ref 36.0–46.0)
Hemoglobin: 10.7 g/dL — ABNORMAL LOW (ref 12.0–15.0)
MCH: 27 pg (ref 26.0–34.0)
MCHC: 33.4 g/dL (ref 30.0–36.0)
MCV: 80.8 fL (ref 78.0–100.0)
PLATELETS: 180 10*3/uL (ref 150–400)
RBC: 3.96 MIL/uL (ref 3.87–5.11)
RDW: 15.7 % — AB (ref 11.5–15.5)
WBC: 9.9 10*3/uL (ref 4.0–10.5)

## 2015-02-14 LAB — BASIC METABOLIC PANEL
Anion gap: 8 (ref 5–15)
BUN: 5 mg/dL — AB (ref 6–20)
CALCIUM: 9.3 mg/dL (ref 8.9–10.3)
CHLORIDE: 106 mmol/L (ref 101–111)
CO2: 20 mmol/L — ABNORMAL LOW (ref 22–32)
CREATININE: 0.89 mg/dL (ref 0.44–1.00)
Glucose, Bld: 100 mg/dL — ABNORMAL HIGH (ref 65–99)
Potassium: 3.7 mmol/L (ref 3.5–5.1)
SODIUM: 134 mmol/L — AB (ref 135–145)

## 2015-02-14 LAB — HEPARIN LEVEL (UNFRACTIONATED)
Heparin Unfractionated: <0.1 IU/mL — ABNORMAL LOW (ref 0.30–0.70)
Heparin Unfractionated: 0.18 IU/mL — ABNORMAL LOW (ref 0.30–0.70)

## 2015-02-14 LAB — MAGNESIUM: Magnesium: 2 mg/dL (ref 1.7–2.4)

## 2015-02-14 MED ORDER — HEPARIN (PORCINE) IN NACL 100-0.45 UNIT/ML-% IJ SOLN
1600.0000 [IU]/h | INTRAMUSCULAR | Status: DC
  Administered 2015-02-14: 1500 [IU]/h via INTRAVENOUS
  Filled 2015-02-14 (×3): qty 250

## 2015-02-14 MED ORDER — METOPROLOL TARTRATE 12.5 MG HALF TABLET
12.5000 mg | ORAL_TABLET | Freq: Two times a day (BID) | ORAL | Status: DC
  Administered 2015-02-14 – 2015-02-15 (×3): 12.5 mg via ORAL
  Filled 2015-02-14 (×4): qty 1

## 2015-02-14 NOTE — Progress Notes (Signed)
Utilization Review Completed.  

## 2015-02-14 NOTE — Progress Notes (Signed)
ANTICOAGULATION CONSULT NOTE - Follow Up Consult  Pharmacy Consult for heparin Indication: inferior STEMI (removed IABP 8/29 PM)  Allergies  Allergen Reactions  . Adderall [Amphetamine-Dextroamphetamine] Other (See Comments)    High heart rate, stops taking for surgeries  . Bee Venom Swelling  . Ivp Dye [Iodinated Diagnostic Agents] Hives    Fluroscan eye dye per patient  . Lyrica [Pregabalin] Swelling  . Chantix [Varenicline Tartrate] Nausea And Vomiting    Patient Measurements: Height: 5\' 3"  (160 cm) Weight: 167 lb 8.8 oz (76 kg) IBW/kg (Calculated) : 52.4 Heparin Dosing Weight: 68.5 kg  Vital Signs: Temp: 98.9 F (37.2 C) (08/30 1500) Temp Source: Oral (08/30 0515) BP: 118/84 mmHg (08/30 1500) Pulse Rate: 98 (08/30 1500)  Labs:  Recent Labs  02/11/15 1733  02/12/15 0400  02/12/15 0815 02/13/15 0532 02/13/15 1040 02/13/15 1042 02/14/15 0400 02/14/15 0630 02/14/15 1631  HGB  --   < > 10.7*  --   --  10.1*  --   --  10.7*  --   --   HCT  --   --  31.2*  --   --  29.5*  --   --  32.0*  --   --   PLT  --   --  210  --   --  188  --   --  180  --   --   HEPARINUNFRC  --   < >  --   < >  --  0.22*  --   --   --  <0.10* 0.18*  CREATININE  --   --   --   --  0.81  --  0.86  --  0.89  --   --   TROPONINI 40.55*  --   --   --   --   --   --  12.77*  --   --   --   < > = values in this interval not displayed.  Estimated Creatinine Clearance: 64.8 mL/min (by C-G formula based on Cr of 0.89).   Medications:  Scheduled:  . aspirin  324 mg Oral Once  . aspirin  81 mg Oral Daily  . Chlorhexidine Gluconate Cloth  6 each Topical Q0600  . metoprolol tartrate  12.5 mg Oral BID  . mupirocin ointment  1 application Nasal BID  . nicotine  21 mg Transdermal Daily  . potassium chloride  40 mEq Oral Daily  . rosuvastatin  40 mg Oral q1800  . sodium chloride  3 mL Intravenous Q12H  . ticagrelor  90 mg Oral BID   Infusions:  . sodium chloride 75 mL/hr at 02/14/15 0700  .  DOPamine Stopped (02/12/15 2350)  . heparin 1,300 Units/hr (02/14/15 0945)    Assessment: 59 yo female with STEMI s/p IABP removal is currently on subtherapeutic heparin.  Heparin level is 0.18.  No problem with infusion per RN.  Goal of Therapy:  Heparin level 0.3-0.7 units/ml Monitor platelets by anticoagulation protocol: Yes   Plan:  - increase heparin to 1500 units/hr.  - 6 hr heparin level  Menaal Russum, [facility] 02/14/2015,5:00 PM

## 2015-02-14 NOTE — Progress Notes (Addendum)
Patient Name: Catherine Craig Date of Encounter: 02/14/2015   Principal Problem:   Acute ST elevation myocardial infarction (STEMI) involving other coronary artery of inferior wall Active Problems:   Cardiogenic shock   CAD in native artery   Acute right ventricular myocardial infarction   Hypotension   Tobacco abuse   Dyslipidemia   Essential hypertension   GERD    SUBJECTIVE  No chest, jaw, or arm pain.  No dyspnea.  BP's stable off of pressors/IABP.  Good spirits.  CURRENT MEDS . aspirin  324 mg Oral Once  . aspirin  81 mg Oral Daily  . Chlorhexidine Gluconate Cloth  6 each Topical Q0600  . mupirocin ointment  1 application Nasal BID  . nicotine  21 mg Transdermal Daily  . potassium chloride  40 mEq Oral Daily  . rosuvastatin  40 mg Oral q1800  . sodium chloride  3 mL Intravenous Q12H  . ticagrelor  90 mg Oral BID    OBJECTIVE  Filed Vitals:   02/14/15 0600 02/14/15 0700 02/14/15 0915 02/14/15 1100  BP: 131/90 115/78 114/79   Pulse: 102 97 99   Temp:  98.5 F (36.9 C)  97.3 F (36.3 C)  TempSrc:      Resp: 18 17 14    Height:      Weight:      SpO2: 100% 97% 100%     Intake/Output Summary (Last 24 hours) at 02/14/15 1254 Last data filed at 02/14/15 0945  Gross per 24 hour  Intake 2152.35 ml  Output   3000 ml  Net -847.65 ml   Filed Weights   02/11/15 0430 02/12/15 0600 02/13/15 0615  Weight: 158 lb 1.1 oz (71.7 kg) 166 lb 0.1 oz (75.3 kg) 167 lb 8.8 oz (76 kg)    PHYSICAL EXAM  General: Pleasant, NAD. Neuro: Alert and oriented X 3. Moves all extremities spontaneously. Psych: Normal affect. HEENT:  Normal  Neck: Supple without bruits or JVD. Lungs:  Resp regular and unlabored, CTA. Heart: RRR no s3, s4, or murmurs. Abdomen: Soft, non-tender, non-distended, BS + x 4.  Extremities: No clubbing, cyanosis or edema. DP/PT/Radials 2+ and equal bilaterally.  R groin cath/iabp site w/o bleeding/bruit/hematoma.  Accessory Clinical  Findings  CBC  Recent Labs  02/13/15 0532 02/14/15 0400  WBC 10.7* 9.9  HGB 10.1* 10.7*  HCT 29.5* 32.0*  MCV 80.6 80.8  PLT 188 341   Basic Metabolic Panel  Recent Labs  02/13/15 1040 02/13/15 1042 02/14/15 0400  NA 138  --  134*  K 3.3*  --  3.7  CL 112*  --  106  CO2 21*  --  20*  GLUCOSE 117*  --  100*  BUN 6  --  5*  CREATININE 0.86  --  0.89  CALCIUM 9.2  --  9.3  MG  --  1.5* 2.0   Cardiac Enzymes  Recent Labs  02/11/15 1733 02/13/15 1042  TROPONINI 40.55* 12.77*   TELE  Rsr, sinus tach.  pvc's.  Radiology/Studies  Dg Chest Port 1 View  02/13/2015   CLINICAL DATA:  Encounter for management of intra-aortic balloon pump.  EXAM: PORTABLE CHEST - 1 VIEW  COMPARISON:  02/12/2015  FINDINGS: Stable position of the aortic balloon pump tip in the proximal descending thoracic aorta. Left central line tip in the upper SVC region and unchanged. Again noted is surgical hardware in the thoracic spine. Negative for a pneumothorax. Heart size is stable. Slightly increased interstitial markings in the right  lung. Left lung is clear.  IMPRESSION: Stable support apparatuses as described.  Slightly increased interstitial densities in the right lung. Findings could represent mild asymmetric edema.   Electronically Signed   By: Markus Daft M.D.   On: 02/13/2015 07:45   2D Echocardiogram 8.28.2016  Study Conclusions  - Left ventricle: The cavity size was normal. Wall thickness was   normal. Systolic function was normal. The estimated ejection   fraction was in the range of 60% to 65%. Features are consistent   with a pseudonormal left ventricular filling pattern, with   concomitant abnormal relaxation and increased filling pressure   (grade 2 diastolic dysfunction). Doppler parameters are   consistent with high ventricular filling pressure. Ratio of   mitral valve peak E velocity to medial annulus E (e&') velocity:   19.4. - Regional wall motion abnormality: Mild  hypokinesis of the mid   inferoseptal and mid inferior myocardium. - Aortic valve: There was mild regurgitation. - Mitral valve: There was mild regurgitation. - Left atrium: The atrium was mildly dilated. - Right ventricle: Systolic function was mildly reduced. - Tricuspid valve: There was moderate regurgitation. _____________   ASSESSMENT AND PLAN  1. Acute Inferior STEMI/RV infarct/CAD:  S/p cath on 8/27 revealing occluded prox RCA with residual 80% mid LAD, 70% D1, occluded mid LCX (L L collats), 50% mid RCA, 70% distal RCA.  The RCA was successfully stented w/ a 3.5x20 promus premier DES.  Post-PCI course complicated by hypotension requiring pressor support and IABP.  Dopamine weaned off and IABP out yesterday.  Pressures have been stable.  No recurrent jaw/arm pain.  No dyspnea.  Trop peaked @ 40.55 on 8/27.  Down to 12.77 8/29.  Continue ASA/Brilinta/IV Heparin/high potency statin with plan to consider full revascularization with CABG vs additional PCI.  Will add low-dose bb.  Can go to stepdown today.  2. Cardiogenic shock/hypotension:  BP's stable off of pressors/IABP.  CVP running 7-10.  Volume stable on exam.  EF 60-65%, grade 2 DD, mildly reduced RV function.  Can likely reduce IVF further (currently @ 75 ml/hr).  3. Hyperglycemia: likely due to stress of MI, A1C 5.3.   4. H/O HTN/Hypotension:  BP meds have been on hold due to hypotension.  BP stable as above.  Will likely tolerate low dose bb at this point.  5. HL:  Cont high potency statin.  She says that crestor is the only statin that has ever significantly impacted her LDL.  6. Hypokalemia/hypomagnesemia:  K 3.7 this AM.  Mg normal @ 2.0 this AM.  7. NSVT:  No prolonged NSVT overnight.  Occas PVC's.  Plan to add bb.  K/Mg stable.  8. Tobacco abuse:  Counseled regarding cessation.  She says that she is motivated to quit.  Signed, Murray Hodgkins NP    Patient seen and examined. Agree with assessment and plan. No  recurrent chest pain. Breathing better. Groin site stable; no henatoma or ecchymosis.  Stable hemodynamics, BP 96-100 range, HR in the 90'd.  Will add low dose BB as BP allows. ECG with sinus rhythm RBBB, inferior Q waves.   Transfer to stepdown.  Will review angios with Dr. Tamala Julian ? Subsequent revascularization with PCI vs CABG.   Troy Sine, MD, [facility] 02/14/2015 1:39 PM

## 2015-02-14 NOTE — Progress Notes (Addendum)
ANTICOAGULATION CONSULT NOTE - Follow Up Consult  Pharmacy Consult for Heparin Indication:   inferior STEMI ( removed IABP 8/29 PM)  Allergies  Allergen Reactions  . Adderall [Amphetamine-Dextroamphetamine] Other (See Comments)    High heart rate, stops taking for surgeries  . Bee Venom Swelling  . Ivp Dye [Iodinated Diagnostic Agents] Hives    Fluroscan eye dye per patient  . Lyrica [Pregabalin] Swelling  . Chantix [Varenicline Tartrate] Nausea And Vomiting    Patient Measurements: Height: 5\' 3"  (160 cm) Weight: 167 lb 8.8 oz (76 kg) IBW/kg (Calculated) : 52.4 Heparin Dosing Weight: 68.5 kg  Vital Signs: Temp: 98.5 F (36.9 C) (08/30 0700) Temp Source: Oral (08/30 0515) BP: 115/78 mmHg (08/30 0700) Pulse Rate: 97 (08/30 0700)  Labs:  Recent Labs  02/11/15 1148  02/11/15 1733  02/12/15 0400 02/12/15 0407 02/12/15 0815 02/13/15 0532 02/13/15 1040 02/13/15 1042 02/14/15 0400 02/14/15 0630  HGB  --   --   --   < > 10.7*  --   --  10.1*  --   --  10.7*  --   HCT  --   --   --   --  31.2*  --   --  29.5*  --   --  32.0*  --   PLT  --   --   --   --  210  --   --  188  --   --  180  --   HEPARINUNFRC  --   < >  --   < >  --  0.22*  --  0.22*  --   --   --  <0.10*  CREATININE  --   --   --   --   --   --  0.81  --  0.86  --  0.89  --   TROPONINI 33.77*  --  40.55*  --   --   --   --   --   --  12.77*  --   --   < > = values in this interval not displayed.  Estimated Creatinine Clearance: 64.8 mL/min (by C-G formula based on Cr of 0.89).   Medications:  Scheduled:  . aspirin  324 mg Oral Once  . aspirin  81 mg Oral Daily  . Chlorhexidine Gluconate Cloth  6 each Topical Q0600  . mupirocin ointment  1 application Nasal BID  . nicotine  21 mg Transdermal Daily  . potassium chloride  40 mEq Oral Daily  . rosuvastatin  40 mg Oral q1800  . sodium chloride  3 mL Intravenous Q12H  . ticagrelor  90 mg Oral BID    Assessment: 59yo female on IV heparin infusion for  inferior STEMI, s/p cath. IABP removed yesterday 8/29 @ 16:39.  IV heparin drip was stopped approximately 2 hours prior to removal of IABP. Heparin drip resume ~0100 this AM 8/30.  Heparin level this AM is <0.1, drawn ~ 5 hrs after heparin rate resumed 1150 units/hr. We will increase heparin level goal to 0.3-0.7 since IAPB removed. Hgb stable at 10.7 and pltc wnl at 180K.  No bleeding noted & confirmed with RN.  Once patient stable post MI, decision to be made whether she needs full revascularization with CABG vs additional PCI.  Goal of Therapy:  Increase heparin level goal to 0.3-0.7 units/ml   Monitor platelets by anticoagulation protocol: Yes   Plan:  Increase heparin infusion rate to 1300 units/hr. Check heparin level in 6 hours.  Monitor for s/s of bleeding Daily heparin level and CBC.   Thank you for allowing pharmacy to be part of this patients care team. Nicole Cella, RPh Clinical Pharmacist Pager: [phone] 02/14/2015 8:46 AM

## 2015-02-15 DIAGNOSIS — I10 Essential (primary) hypertension: Secondary | ICD-10-CM

## 2015-02-15 LAB — BASIC METABOLIC PANEL
Anion gap: 9 (ref 5–15)
BUN: 8 mg/dL (ref 6–20)
CHLORIDE: 109 mmol/L (ref 101–111)
CO2: 18 mmol/L — AB (ref 22–32)
CREATININE: 0.89 mg/dL (ref 0.44–1.00)
Calcium: 9.3 mg/dL (ref 8.9–10.3)
GFR calc non Af Amer: >60 mL/min (ref >60)
Glucose, Bld: 116 mg/dL — ABNORMAL HIGH (ref 65–99)
Potassium: 3.6 mmol/L (ref 3.5–5.1)
Sodium: 136 mmol/L (ref 135–145)

## 2015-02-15 LAB — MAGNESIUM: Magnesium: 1.7 mg/dL (ref 1.7–2.4)

## 2015-02-15 LAB — HEPARIN LEVEL (UNFRACTIONATED)
HEPARIN UNFRACTIONATED: 0.27 [IU]/mL — AB (ref 0.30–0.70)
Heparin Unfractionated: 0.23 IU/mL — ABNORMAL LOW (ref 0.30–0.70)
Heparin Unfractionated: 0.37 IU/mL (ref 0.30–0.70)
Heparin Unfractionated: 0.46 IU/mL (ref 0.30–0.70)

## 2015-02-15 LAB — CBC
HEMATOCRIT: 30.3 % — AB (ref 36.0–46.0)
HEMOGLOBIN: 10.1 g/dL — AB (ref 12.0–15.0)
MCH: 26.9 pg (ref 26.0–34.0)
MCHC: 33.3 g/dL (ref 30.0–36.0)
MCV: 80.8 fL (ref 78.0–100.0)
Platelets: 195 10*3/uL (ref 150–400)
RBC: 3.75 MIL/uL — ABNORMAL LOW (ref 3.87–5.11)
RDW: 15.8 % — ABNORMAL HIGH (ref 11.5–15.5)
WBC: 8.8 10*3/uL (ref 4.0–10.5)

## 2015-02-15 MED ORDER — HEPARIN (PORCINE) IN NACL 100-0.45 UNIT/ML-% IJ SOLN
1750.0000 [IU]/h | INTRAMUSCULAR | Status: DC
  Administered 2015-02-15 – 2015-02-16 (×2): 1750 [IU]/h via INTRAVENOUS
  Filled 2015-02-15 (×3): qty 250

## 2015-02-15 MED ORDER — POTASSIUM CHLORIDE CRYS ER 20 MEQ PO TBCR
40.0000 meq | EXTENDED_RELEASE_TABLET | Freq: Two times a day (BID) | ORAL | Status: DC
  Administered 2015-02-15 – 2015-02-16 (×3): 40 meq via ORAL
  Filled 2015-02-15 (×3): qty 2

## 2015-02-15 MED ORDER — MAGNESIUM OXIDE 400 (241.3 MG) MG PO TABS
400.0000 mg | ORAL_TABLET | Freq: Every day | ORAL | Status: DC
  Administered 2015-02-15 – 2015-02-17 (×3): 400 mg via ORAL
  Filled 2015-02-15 (×3): qty 1

## 2015-02-15 NOTE — Care Management Note (Addendum)
Case Management Note  Patient Details  Name: Joslynn Jamroz MRN: [id number] Date of Birth: Sep 16, 1955  Subjective/Objective:    Adm w mi                Action/Plan: lives w husband, pcp dr Cordelia Pen   Expected Discharge Date:                 Expected Discharge Plan:  Home/Self Care  In-House Referral:     Discharge planning Services  CM Consult, Medication Assistance  Post Acute Care Choice:    Choice offered to:     DME Arranged:    DME Agency:     HH Arranged:    [place] Agency:     Status of Service:     Medicare Important Message Given:    Date Medicare IM Given:    Medicare IM give by:    Date Additional Medicare IM Given:    Additional Medicare Important Message give by:     If discussed at [place] of Stay Meetings, dates discussed:    Additional Comments: gave pt 30day free and copay assist card for brilinta. Pt ins does not cover meds other than generics. She will have to pay for brilinta out of pocket. Have placed brilinta pt assist form on shadow chart for md to sign.  Lacretia Leigh, RN 02/15/2015, 1:58 PM

## 2015-02-15 NOTE — Progress Notes (Signed)
Patient: Catherine Craig / Admit Date: 02/11/2015 / Date of Encounter: 02/15/2015, 10:55 AM   Subjective: Feeling good - except she has not gotten any sleep. Her neighbor has been confused and loud all night long. She is still waiting to go a stepdown bed when available. No further jaw pain. Denies SOB/CP.   Objective: Telemetry: NSR. There is a prolonged period this AM where no activity is seen but nurse confirms this was when patient inadvertantly pulled off her telemetry wires trying to move around in the room Physical Exam: Blood pressure 117/78, pulse 92, temperature 98.7 F (37.1 C), temperature source Oral, resp. rate 30, height 5\' 3"  (1.6 m), weight 167 lb 8.8 oz (76 kg), SpO2 97 %. General: Well developed, well nourished WF in no acute distress. Head: Normocephalic, atraumatic, sclera non-icteric, no xanthomas, nares are without discharge. Neck: Negative for carotid bruits. JVP not elevated. Lungs: Coarse at bases bilaterally to auscultation without wheezes, rales, or rhonchi. Breathing is unlabored. Heart: RRR S1 S2 without murmurs, rubs, or gallops.  Abdomen: Soft, non-tender, non-distended with normoactive bowel sounds. No rebound/guarding. Extremities: No clubbing or cyanosis. No edema. Distal pedal pulses are 2+ and equal bilaterally. Neuro: Alert and oriented X 3. Moves all extremities spontaneously. Psych:  Responds to questions appropriately with a normal affect.   Intake/Output Summary (Last 24 hours) at 02/15/15 1055 Last data filed at 02/15/15 0800  Gross per 24 hour  Intake 1788.08 ml  Output      0 ml  Net 1788.08 ml    Inpatient Medications:  . aspirin  81 mg Oral Daily  . Chlorhexidine Gluconate Cloth  6 each Topical Q0600  . metoprolol tartrate  12.5 mg Oral BID  . mupirocin ointment  1 application Nasal BID  . nicotine  21 mg Transdermal Daily  . potassium chloride  40 mEq Oral Daily  . rosuvastatin  40 mg Oral q1800  . sodium chloride  3 mL Intravenous  Q12H  . ticagrelor  90 mg Oral BID   Infusions:  . sodium chloride 50 mL/hr at 02/15/15 0800  . heparin 1,750 Units/hr (02/15/15 0900)    Labs:  Recent Labs  02/14/15 0400 02/15/15 0250  NA 134* 136  K 3.7 3.6  CL 106 109  CO2 20* 18*  GLUCOSE 100* 116*  BUN 5* 8  CREATININE 0.89 0.89  CALCIUM 9.3 9.3  MG 2.0 1.7   No results for input(s): AST, ALT, ALKPHOS, BILITOT, PROT, ALBUMIN in the last 72 hours.  Recent Labs  02/14/15 0400 02/15/15 0250  WBC 9.9 8.8  HGB 10.7* 10.1*  HCT 32.0* 30.3*  MCV 80.8 80.8  PLT 180 195    Recent Labs  02/13/15 1042  TROPONINI 12.77*   Invalid input(s): POCBNP No results for input(s): HGBA1C in the last 72 hours.   Radiology/Studies:  Dg Chest 2 View  02/11/2015   CLINICAL DATA:  Left-sided chest pain radiating to the left arm. Jaw pain. Shortness of breath.  EXAM: CHEST  2 VIEW  COMPARISON:  10/28/2011  FINDINGS: The cardiomediastinal contours are normal. Mild bronchial thickening in the upper lobes, right greater than left. Pulmonary vasculature is normal. No consolidation, pleural effusion, or pneumothorax. Posterior fusion with rods and intrapedicular screws, mid-lower thoracic and lumbar spine. No acute osseous abnormalities are seen.  IMPRESSION: Mild upper lobe bronchial thickening.  Otherwise no acute process.   Electronically Signed   By: Jeb Levering M.D.   On: 02/11/2015 02:46   Dg Chest [facility]  1 View  02/13/2015   CLINICAL DATA:  Encounter for management of intra-aortic balloon pump.  EXAM: PORTABLE CHEST - 1 VIEW  COMPARISON:  02/12/2015  FINDINGS: Stable position of the aortic balloon pump tip in the proximal descending thoracic aorta. Left central line tip in the upper SVC region and unchanged. Again noted is surgical hardware in the thoracic spine. Negative for a pneumothorax. Heart size is stable. Slightly increased interstitial markings in the right lung. Left lung is clear.  IMPRESSION: Stable support apparatuses as  described.  Slightly increased interstitial densities in the right lung. Findings could represent mild asymmetric edema.   Electronically Signed   By: Markus Daft M.D.   On: 02/13/2015 07:45   Dg Chest Port 1 View  02/12/2015   CLINICAL DATA:  Intra aortic balloon pump.  EXAM: PORTABLE CHEST - 1 VIEW  COMPARISON:  One day prior  FINDINGS: Left internal jugular line terminates at the high SVC. Intra aortic balloon pump unchanged in position, with radiopaque portion in the proximal descending aorta. Patient rotated right. Mild cardiomegaly. Thoracic spine fixation. Mild right hemidiaphragm elevation. No pleural effusion or pneumothorax. No congestive failure. No lobar consolidation.  IMPRESSION: Cardiomegaly with resolution of venous congestion.  Stable support apparatus.   Electronically Signed   By: Abigail Miyamoto M.D.   On: 02/12/2015 08:28   Dg Chest Port 1 View  02/11/2015   CLINICAL DATA:  Respiratory failure, acute myocardial infarction, status post coronary intervention  EXAM: PORTABLE CHEST - 1 VIEW  COMPARISON:  02/11/2015  FINDINGS: Left IJ central line tip at the innominate SVC junction. Radiopaque aortic balloon pump tip in the proximal descending thoracic aorta. Lower lung volumes evident. Heart is enlarged with mild diffuse vascular congestion pattern. No focal pneumonia, collapse or consolidation. No effusion or pneumothorax. Thoracic fusion hardware evident.  IMPRESSION: Support apparatus as above.  Low volume exam with mild cardiomegaly and vascular congestion   Electronically Signed   By: Jerilynn Mages.  Shick M.D.   On: 02/11/2015 08:46     Assessment and Plan  59 year old woman with history of hypertension, hyperlipidemia, chronic back pain, presenting with 2 hours of jaw pain radiating to the chest and left shoulder and EKG changes consistent with inferolateral STEMI.   1. Inferior STEMI - cath revealed 80% mid LAD, 70% D1, occluded mid LCX, 50% mid RCA, 70% distal RCA, acute occlusion of prox  RCA s/p DES to prox RCA.This was complicated by hypotension requiring pressor support. Continue ASA/Brilinta/IV Heparin/statin.Tolerating low dose BB.Per interventional note, once she is stable post MI, goal is to decide whether she needs full revascularization with CABG vs additional PCI. Will await outcome of MD discussion.  2. Cardiogenic shock with ? RV infarct, improved - now off pressors/IABP. 2D echo 02/12/15: EF 60-65%, grade 2 DD, high ventricular filling pressure, mild HK of mid-inferoseptal and mid inferior myocardium, mild AI/MR, mod TR, mildly reduced RV function. Will saline lock IV fluids today.   3. Hyperglycemia - likely due to stress of MI, A1C 5.3.   4. HTN - given hypotension this admission, meds are being cautiously added.  5. Dyslipidemia - now on high dose statin.  6. NSVT (6 beats 02/13/15) in setting of hypokalemia, hypomagnesemia - lytes were repleted but need to be titrated. Add MagOx 400mg  daily and increase KCl to 40mEq BID.  7. Tobacco abuse - counseled regarding cessation.  Signed, Melina Copa PA-C Pager: [phone]   Patient seen and examined. Agree with assessment and plan. I have  reviewed angios.   Will plan to re-cath tomorrow to re-assess anatomy; if flow improved in distal RCA, LCx ? PCI to LAD/LCX vs refer to elective CABG. Keep NPO.   Troy Sine, MD, [facility] 02/15/2015 1:57 PM

## 2015-02-15 NOTE — Progress Notes (Signed)
Transferred- in from 2S by wheelchair awake and alert.

## 2015-02-15 NOTE — Progress Notes (Signed)
CARDIAC REHAB PHASE I   PRE:  Rate/Rhythm: 90 sR  BP:  Sitting: 120/89        SaO2: 100 RA  MODE:  Ambulation: 170 ft   POST:  Rate/Rhythm: 103 ST  BP:  Sitting: 163/79         SaO2: 100 RA  Pt ambulated 170 ft on RA, IV, rolling walker, steady gait, tolerated well. Pt did c/o mild DOE, denies CP, dizziness, brief standing rest x1. Pt states she has a bad back and recently had back surgery and was seeing PT as an outpatient.  Pt may benefit from PT consult this admission to maximize mobility. Pt has difficulty maintaining good posture, needs cues to stand straight. Pt assisted to bathroom, gave pt brilinta card per request of case management, pt states she is concerned about the cost of brilinta with her insurance. Pt states her insurance does not cover non-generic medication. CM notified, states she will check co-pay. Pt sitting on edge of bed eating lunch after walk, call bell within reach. Will follow.   [phone]   Lenna Sciara, RN, BSN 02/15/2015 2:09 PM

## 2015-02-15 NOTE — Progress Notes (Signed)
ANTICOAGULATION CONSULT NOTE - Follow Up Consult  Pharmacy Consult for Heparin  Indication: chest pain/ACS (considering PCI vs CABG)  Allergies  Allergen Reactions  . Adderall [Amphetamine-Dextroamphetamine] Other (See Comments)    High heart rate, stops taking for surgeries  . Bee Venom Swelling  . Ivp Dye [Iodinated Diagnostic Agents] Hives    Fluroscan eye dye per patient  . Lyrica [Pregabalin] Swelling  . Chantix [Varenicline Tartrate] Nausea And Vomiting    Patient Measurements: Height: 5\' 3"  (160 cm) Weight: 167 lb 8.8 oz (76 kg) IBW/kg (Calculated) : 52.4  Vital Signs: Temp: 100.1 F (37.8 C) (08/30 2355) Temp Source: Oral (08/30 2355) BP: 144/94 mmHg (08/31 0000) Pulse Rate: 95 (08/31 0000)  Labs:  Recent Labs  02/12/15 0400  02/12/15 0815 02/13/15 0532 02/13/15 1040 02/13/15 1042 02/14/15 0400 02/14/15 0630 02/14/15 1631 02/14/15 2325  HGB 10.7*  --   --  10.1*  --   --  10.7*  --   --   --   HCT 31.2*  --   --  29.5*  --   --  32.0*  --   --   --   PLT 210  --   --  188  --   --  180  --   --   --   HEPARINUNFRC  --   < >  --  0.22*  --   --   --  <0.10* 0.18* 0.27*  CREATININE  --   --  0.81  --  0.86  --  0.89  --   --   --   TROPONINI  --   --   --   --   --  12.77*  --   --   --   --   < > = values in this interval not displayed.  Estimated Creatinine Clearance: 64.8 mL/min (by C-G formula based on Cr of 0.89).   Assessment: Sub-therapeutic heparin level, trending up, no bleeding issues per RN  Goal of Therapy:  Heparin level 0.3-0.7 units/ml Monitor platelets by anticoagulation protocol: Yes   Plan:  -Increase heparin to 1600 units/hr -0800 HL -Daily CBC/HL -Monitor for bleeding -F/U cardiology plans  Narda Bonds 02/15/2015,12:23 AM

## 2015-02-15 NOTE — Progress Notes (Addendum)
ANTICOAGULATION CONSULT NOTE - Follow Up Consult  Pharmacy Consult for Heparin  Indication: chest pain/ACS (considering PCI vs CABG)  Allergies  Allergen Reactions  . Adderall [Amphetamine-Dextroamphetamine] Other (See Comments)    High heart rate, stops taking for surgeries  . Bee Venom Swelling  . Ivp Dye [Iodinated Diagnostic Agents] Hives    Fluroscan eye dye per patient  . Lyrica [Pregabalin] Swelling  . Chantix [Varenicline Tartrate] Nausea And Vomiting    Patient Measurements: Height: 5\' 3"  (160 cm) Weight: 167 lb 8.8 oz (76 kg) IBW/kg (Calculated) : 52.4  Heparin Dosing Weight: 68.5 kg  Vital Signs: Temp: 98.7 F (37.1 C) (08/31 0740) Temp Source: Oral (08/31 0740) BP: 134/87 mmHg (08/31 0700) Pulse Rate: 99 (08/31 0700)  Labs:  Recent Labs  02/13/15 0532 02/13/15 1040 02/13/15 1042 02/14/15 0400  02/14/15 1631 02/14/15 2325 02/15/15 0250 02/15/15 0716  HGB 10.1*  --   --  10.7*  --   --   --  10.1*  --   HCT 29.5*  --   --  32.0*  --   --   --  30.3*  --   PLT 188  --   --  180  --   --   --  195  --   HEPARINUNFRC 0.22*  --   --   --   < > 0.18* 0.27*  --  0.23*  CREATININE  --  0.86  --  0.89  --   --   --  0.89  --   TROPONINI  --   --  12.77*  --   --   --   --   --   --   < > = values in this interval not displayed.  Estimated Creatinine Clearance: 64.8 mL/min (by C-G formula based on Cr of 0.89).   Assessment: Acute inferior STEMI/RV infarct/CAD s/p cath/PCI 02/11/15. Heparin level is 0.23, sub-therapeutic heparin level despite increase in heparin rate last night to 1600 units/hr. CBC stable with Hgb 10.1.  No bleeding noted.   Goal of Therapy:  Heparin level 0.3-0.7 units/ml Monitor platelets by anticoagulation protocol: Yes   Plan:  -Increase heparin to 1750 units/hr -Check 6 hr HL -Daily CBC/HL -Monitor for bleeding -F/U cardiology plans  Nicole Cella, RPh Clinical Pharmacist Pager: [phone] 02/15/2015,8:39  AM  ______________________________________________________ ADDENDUM: Heparin level now therapeutic after rate increase this morning, but HL on the low end of therapeutic range (currently 0.37).  Goal of Therapy:  Heparin level 0.3-0.7 units/ml Monitor platelets by anticoagulation protocol: Yes   Plan:  -Continue heparin at 1750 units/hr -Check 6hr HL -Monitor daily HL, CBC, s/sx of bleeding -F/U cardiology plans (CABG vs PCI)  Drucie Opitz, PharmD Clinical Pharmacist Pager: [phone] 02/15/2015 4:59 PM   Addendum: Heparin level remains therapeutic on 1750 units/hr. No change.  Follow-up with AM labs.  [employer], Pharm.D., BCPS Clinical Pharmacist Pager [phone] 02/15/2015 10:37 PM

## 2015-02-16 ENCOUNTER — Encounter (HOSPITAL_COMMUNITY)
Admission: EM | Disposition: A | Discharge status: Home / Self Care | Payer: Self-pay | Source: Home / Self Care | Attending: Interventional Cardiology

## 2015-02-16 DIAGNOSIS — D649 Anemia, unspecified: Secondary | ICD-10-CM

## 2015-02-16 DIAGNOSIS — E876 Hypokalemia: Secondary | ICD-10-CM

## 2015-02-16 DIAGNOSIS — I4729 Other ventricular tachycardia: Secondary | ICD-10-CM

## 2015-02-16 DIAGNOSIS — I472 Ventricular tachycardia: Secondary | ICD-10-CM

## 2015-02-16 HISTORY — PX: CARDIAC CATHETERIZATION: SHX172

## 2015-02-16 LAB — CBC
HCT: 32.9 % — ABNORMAL LOW (ref 36.0–46.0)
HCT: 33.6 % — ABNORMAL LOW (ref 36.0–46.0)
Hemoglobin: 10.8 g/dL — ABNORMAL LOW (ref 12.0–15.0)
Hemoglobin: 10.8 g/dL — ABNORMAL LOW (ref 12.0–15.0)
MCH: 26.6 pg (ref 26.0–34.0)
MCH: 26.2 pg (ref 26.0–34.0)
MCHC: 32.8 g/dL (ref 30.0–36.0)
MCHC: 32.1 g/dL (ref 30.0–36.0)
MCV: 81 fL (ref 78.0–100.0)
MCV: 81.4 fL (ref 78.0–100.0)
PLATELETS: 215 10*3/uL (ref 150–400)
PLATELETS: 242 10*3/uL (ref 150–400)
RBC: 4.06 MIL/uL (ref 3.87–5.11)
RBC: 4.13 MIL/uL (ref 3.87–5.11)
RDW: 15.8 % — ABNORMAL HIGH (ref 11.5–15.5)
RDW: 15.9 % — AB (ref 11.5–15.5)
WBC: 8.9 10*3/uL (ref 4.0–10.5)
WBC: 8.6 10*3/uL (ref 4.0–10.5)

## 2015-02-16 LAB — BASIC METABOLIC PANEL
Anion gap: 9 (ref 5–15)
BUN: 10 mg/dL (ref 6–20)
CALCIUM: 10 mg/dL (ref 8.9–10.3)
CO2: 20 mmol/L — ABNORMAL LOW (ref 22–32)
CREATININE: 0.88 mg/dL (ref 0.44–1.00)
Chloride: 110 mmol/L (ref 101–111)
GFR calc Af Amer: >60 mL/min (ref >60)
Glucose, Bld: 104 mg/dL — ABNORMAL HIGH (ref 65–99)
Potassium: 3.9 mmol/L (ref 3.5–5.1)
SODIUM: 139 mmol/L (ref 135–145)

## 2015-02-16 LAB — POCT ACTIVATED CLOTTING TIME: ACTIVATED CLOTTING TIME: 466 s

## 2015-02-16 LAB — CREATININE, SERUM: CREATININE: 0.91 mg/dL (ref 0.44–1.00)

## 2015-02-16 LAB — HEPARIN LEVEL (UNFRACTIONATED): Heparin Unfractionated: 0.37 IU/mL (ref 0.30–0.70)

## 2015-02-16 LAB — MAGNESIUM: MAGNESIUM: 1.9 mg/dL (ref 1.7–2.4)

## 2015-02-16 SURGERY — CORONARY STENT INTERVENTION

## 2015-02-16 MED ORDER — LIDOCAINE HCL (PF) 1 % IJ SOLN
INTRAMUSCULAR | Status: AC
  Filled 2015-02-16: qty 30

## 2015-02-16 MED ORDER — MIDAZOLAM HCL 2 MG/2ML IJ SOLN
INTRAMUSCULAR | Status: AC
  Filled 2015-02-16: qty 4

## 2015-02-16 MED ORDER — VERAPAMIL HCL 2.5 MG/ML IV SOLN
INTRAVENOUS | Status: DC | PRN
  Administered 2015-02-16: 12:00:00 via INTRA_ARTERIAL

## 2015-02-16 MED ORDER — SODIUM CHLORIDE 0.9 % IV SOLN: 250.0000 mL | INTRAVENOUS | Status: DC | PRN

## 2015-02-16 MED ORDER — ANGIOPLASTY BOOK
Freq: Once | Status: AC
  Administered 2015-02-16: 21:00:00
  Filled 2015-02-16: qty 1

## 2015-02-16 MED ORDER — METOPROLOL TARTRATE 25 MG PO TABS
25.0000 mg | ORAL_TABLET | Freq: Two times a day (BID) | ORAL | Status: DC
  Administered 2015-02-16 (×2): 25 mg via ORAL
  Filled 2015-02-16 (×2): qty 1

## 2015-02-16 MED ORDER — SODIUM CHLORIDE 0.9 % IV SOLN
1.7500 mg/kg/h | INTRAVENOUS | Status: AC
  Administered 2015-02-16: 1.75 mg/kg/h via INTRAVENOUS
  Filled 2015-02-16: qty 250

## 2015-02-16 MED ORDER — SODIUM CHLORIDE 0.9 % IJ SOLN
3.0000 mL | Freq: Two times a day (BID) | INTRAMUSCULAR | Status: DC
  Administered 2015-02-16: 3 mL via INTRAVENOUS

## 2015-02-16 MED ORDER — MIDAZOLAM HCL 2 MG/2ML IJ SOLN
INTRAMUSCULAR | Status: DC | PRN
  Administered 2015-02-16: 1 mg via INTRAVENOUS

## 2015-02-16 MED ORDER — SODIUM CHLORIDE 0.9 % IJ SOLN: 3.0000 mL | INTRAMUSCULAR | Status: DC | PRN

## 2015-02-16 MED ORDER — SODIUM CHLORIDE 0.9 % WEIGHT BASED INFUSION
1.0000 mL/kg/h | INTRAVENOUS | Status: AC
  Administered 2015-02-16: 15:00:00 1 mL/kg/h via INTRAVENOUS

## 2015-02-16 MED ORDER — FENTANYL CITRATE (PF) 100 MCG/2ML IJ SOLN
INTRAMUSCULAR | Status: DC | PRN
  Administered 2015-02-16: 25 ug via INTRAVENOUS

## 2015-02-16 MED ORDER — BIVALIRUDIN 250 MG IV SOLR
250.0000 mg | INTRAVENOUS | Status: DC | PRN
  Administered 2015-02-16: 1.75 mg/kg/h via INTRAVENOUS

## 2015-02-16 MED ORDER — FENTANYL CITRATE (PF) 100 MCG/2ML IJ SOLN
INTRAMUSCULAR | Status: AC
Start: 2015-02-16 — End: 2015-02-16
  Filled 2015-02-16: qty 4

## 2015-02-16 MED ORDER — ASPIRIN 81 MG PO CHEW
81.0000 mg | CHEWABLE_TABLET | ORAL | Status: AC
  Administered 2015-02-16: 81 mg via ORAL

## 2015-02-16 MED ORDER — BIVALIRUDIN BOLUS VIA INFUSION - CUPID
INTRAVENOUS | Status: DC | PRN
  Administered 2015-02-16: 50.85 mg via INTRAVENOUS

## 2015-02-16 MED ORDER — HEPARIN (PORCINE) IN NACL 2-0.9 UNIT/ML-% IJ SOLN
INTRAMUSCULAR | Status: AC
  Filled 2015-02-16: qty 1000

## 2015-02-16 MED ORDER — ACTIVE PARTNERSHIP FOR HEALTH OF YOUR HEART BOOK
Freq: Once | Status: AC
Start: 2015-02-16 — End: 2015-02-16
  Administered 2015-02-16: 21:00:00
  Filled 2015-02-16: qty 1

## 2015-02-16 MED ORDER — HEPARIN SODIUM (PORCINE) 5000 UNIT/ML IJ SOLN
5000.0000 [IU] | Freq: Three times a day (TID) | INTRAMUSCULAR | Status: DC
  Administered 2015-02-17: 06:00:00 5000 [IU] via SUBCUTANEOUS
  Filled 2015-02-16: qty 1

## 2015-02-16 MED ORDER — BIVALIRUDIN 250 MG IV SOLR
INTRAVENOUS | Status: AC
  Filled 2015-02-16: qty 250

## 2015-02-16 MED ORDER — IOHEXOL 350 MG/ML SOLN
INTRAVENOUS | Status: DC | PRN
  Administered 2015-02-16: 190 mL via INTRACARDIAC

## 2015-02-16 MED ORDER — NITROGLYCERIN 1 MG/10 ML FOR IR/CATH LAB
INTRA_ARTERIAL | Status: DC | PRN
  Administered 2015-02-16: 14:00:00

## 2015-02-16 MED ORDER — NITROGLYCERIN 1 MG/10 ML FOR IR/CATH LAB
INTRA_ARTERIAL | Status: AC
  Filled 2015-02-16: qty 10

## 2015-02-16 MED ORDER — VERAPAMIL HCL 2.5 MG/ML IV SOLN
INTRAVENOUS | Status: AC
  Filled 2015-02-16: qty 2

## 2015-02-16 MED ORDER — SODIUM CHLORIDE 0.9 % WEIGHT BASED INFUSION: 1.0000 mL/kg/h | INTRAVENOUS | Status: DC

## 2015-02-16 MED ORDER — SODIUM CHLORIDE 0.9 % WEIGHT BASED INFUSION
3.0000 mL/kg/h | INTRAVENOUS | Status: AC
  Administered 2015-02-16: 3 mL/kg/h via INTRAVENOUS

## 2015-02-16 MED ORDER — LIDOCAINE HCL (PF) 1 % IJ SOLN
INTRAMUSCULAR | Status: DC | PRN
  Administered 2015-02-16: 5 mL via SUBCUTANEOUS

## 2015-02-16 SURGICAL SUPPLY — 28 items
BALLN EMERGE MR 2.0X12 (BALLOONS) ×4
BALLN EMERGE MR 2.25X15 (BALLOONS) ×4
BALLN ~~LOC~~ EMERGE MR 2.75X20 (BALLOONS) ×4
BALLN ~~LOC~~ EMERGE MR 3.25X8 (BALLOONS) ×4
BALLOON EMERGE MR 2.0X12 (BALLOONS) ×2 IMPLANT
BALLOON EMERGE MR 2.25X15 (BALLOONS) ×2 IMPLANT
BALLOON ~~LOC~~ EMERGE MR 2.75X20 (BALLOONS) ×2 IMPLANT
BALLOON ~~LOC~~ EMERGE MR 3.25X8 (BALLOONS) ×2 IMPLANT
CATH INFINITI 5 FR JL3.5 (CATHETERS) ×4 IMPLANT
CATH INFINITI 5FR ANG PIGTAIL (CATHETERS) ×4 IMPLANT
CATH INFINITI JR4 5F (CATHETERS) ×4 IMPLANT
CATH VISTA GUIDE 6FR 3DRC (CATHETERS) ×4 IMPLANT
CATH VISTA GUIDE 6FR XBLAD3.5 (CATHETERS) ×4 IMPLANT
DEVICE RAD COMP TR BAND LRG (VASCULAR PRODUCTS) ×4 IMPLANT
GLIDESHEATH SLEND SS 6F .021 (SHEATH) ×4 IMPLANT
KIT ENCORE 26 ADVANTAGE (KITS) ×4 IMPLANT
KIT HEART LEFT (KITS) ×4 IMPLANT
PACK CARDIAC CATHETERIZATION (CUSTOM PROCEDURE TRAY) ×4 IMPLANT
STENT PROMUS PREM MR 2.5X38 (Permanent Stent) ×4 IMPLANT
STENT PROMUS PREM MR 3.0X12 (Permanent Stent) ×4 IMPLANT
SYR MEDRAD MARK V 150ML (SYRINGE) ×4 IMPLANT
TRANSDUCER W/STOPCOCK (MISCELLANEOUS) ×4 IMPLANT
TUBING CIL FLEX 10 FLL-RA (TUBING) ×4 IMPLANT
WIRE ASAHI PROWATER 180CM (WIRE) ×8 IMPLANT
WIRE HI TORQ VERSACORE-J 145CM (WIRE) ×4 IMPLANT
WIRE HI TORQ WHISPER MS 190CM (WIRE) ×4 IMPLANT
WIRE PT2 MS 185 (WIRE) ×4 IMPLANT
WIRE SAFE-T 1.5MM-J .035X260CM (WIRE) ×4 IMPLANT

## 2015-02-16 NOTE — Care Management Note (Signed)
Case Management Note  Patient Details  Name: Catherine Craig MRN: [id number] Date of Birth: 02-12-1956  Subjective/Objective:                    Action/Plan:   Expected Discharge Date:                 Expected Discharge Plan:  Home/Self Care  In-House Referral:     Discharge planning Services  CM Consult, Medication Assistance  Post Acute Care Choice:    Choice offered to:     DME Arranged:    DME Agency:     HH Arranged:    [place] Agency:     Status of Service:     Medicare Important Message Given:    Date Medicare IM Given:    Medicare IM give by:    Date Additional Medicare IM Given:    Additional Medicare Important Message give by:     If discussed at [place] of Stay Meetings, dates discussed:    Additional Comments:  Lacretia Leigh, RN 02/16/2015, 9:02 AM

## 2015-02-16 NOTE — Progress Notes (Signed)
Patient: Catherine Craig / Admit Date: 02/11/2015 / Date of Encounter: 02/16/2015, 8:02 AM   Subjective: Feeling good, just anxious to proceed with cath. No CP or SOB.   Objective: Telemetry: NSR, occasional PVCs, brief ventricular trigeminy overnight Physical Exam: Blood pressure 137/88, pulse 85, temperature 97.6 F (36.4 C), temperature source Oral, resp. rate 20, height 5\' 3"  (1.6 m), weight 149 lb 8 oz (67.813 kg), SpO2 98 %. General: Well developed, well nourished WF in no acute distress. Head: Normocephalic, atraumatic, sclera non-icteric, no xanthomas, nares are without discharge. Neck: JVP not elevated. Lungs: Clear bilaterally to auscultation without wheezes, rales, or rhonchi. Breathing is unlabored. Heart: RRR S1 S2 without murmurs, rubs, or gallops.  Abdomen: Soft, non-tender, non-distended with normoactive bowel sounds. No rebound/guarding. Right groin procedure site with mild-mod ecchymosis, no hematoma, soft. Extremities: No clubbing or cyanosis. No edema. Distal pedal pulses are 2+ and equal bilaterally. Neuro: Alert and oriented X 3. Moves all extremities spontaneously. Psych:  Responds to questions appropriately with a normal affect.   Intake/Output Summary (Last 24 hours) at 02/16/15 0802 Last data filed at 02/16/15 0400  Gross per 24 hour  Intake    640 ml  Output      0 ml  Net    640 ml    Inpatient Medications:  . aspirin  81 mg Oral Daily  . aspirin  81 mg Oral Pre-Cath  . Chlorhexidine Gluconate Cloth  6 each Topical Q0600  . magnesium oxide  400 mg Oral Daily  . metoprolol tartrate  12.5 mg Oral BID  . mupirocin ointment  1 application Nasal BID  . nicotine  21 mg Transdermal Daily  . potassium chloride  40 mEq Oral BID  . rosuvastatin  40 mg Oral q1800  . sodium chloride  3 mL Intravenous Q12H  . ticagrelor  90 mg Oral BID   Infusions:  . sodium chloride     Followed by  . sodium chloride    . heparin 1,750 Units/hr (02/16/15 0639)     Labs:  Recent Labs  02/15/15 0250 02/16/15 0250  NA 136 139  K 3.6 3.9  CL 109 110  CO2 18* 20*  GLUCOSE 116* 104*  BUN 8 10  CREATININE 0.89 0.88  CALCIUM 9.3 10.0  MG 1.7 1.9   No results for input(s): AST, ALT, ALKPHOS, BILITOT, PROT, ALBUMIN in the last 72 hours.  Recent Labs  02/15/15 0250 02/16/15 0250  WBC 8.8 8.9  HGB 10.1* 10.8*  HCT 30.3* 32.9*  MCV 80.8 81.0  PLT 195 215    Recent Labs  02/13/15 1042  TROPONINI 12.77*   Invalid input(s): POCBNP No results for input(s): HGBA1C in the last 72 hours.   Radiology/Studies:  Dg Chest 2 View  02/11/2015   CLINICAL DATA:  Left-sided chest pain radiating to the left arm. Jaw pain. Shortness of breath.  EXAM: CHEST  2 VIEW  COMPARISON:  10/28/2011  FINDINGS: The cardiomediastinal contours are normal. Mild bronchial thickening in the upper lobes, right greater than left. Pulmonary vasculature is normal. No consolidation, pleural effusion, or pneumothorax. Posterior fusion with rods and intrapedicular screws, mid-lower thoracic and lumbar spine. No acute osseous abnormalities are seen.  IMPRESSION: Mild upper lobe bronchial thickening.  Otherwise no acute process.   Electronically Signed   By: Jeb Levering M.D.   On: 02/11/2015 02:46   Dg Chest Port 1 View  02/13/2015   CLINICAL DATA:  Encounter for management of intra-aortic balloon pump.  EXAM: PORTABLE CHEST - 1 VIEW  COMPARISON:  02/12/2015  FINDINGS: Stable position of the aortic balloon pump tip in the proximal descending thoracic aorta. Left central line tip in the upper SVC region and unchanged. Again noted is surgical hardware in the thoracic spine. Negative for a pneumothorax. Heart size is stable. Slightly increased interstitial markings in the right lung. Left lung is clear.  IMPRESSION: Stable support apparatuses as described.  Slightly increased interstitial densities in the right lung. Findings could represent mild asymmetric edema.   Electronically  Signed   By: Markus Daft M.D.   On: 02/13/2015 07:45   Dg Chest Port 1 View  02/12/2015   CLINICAL DATA:  Intra aortic balloon pump.  EXAM: PORTABLE CHEST - 1 VIEW  COMPARISON:  One day prior  FINDINGS: Left internal jugular line terminates at the high SVC. Intra aortic balloon pump unchanged in position, with radiopaque portion in the proximal descending aorta. Patient rotated right. Mild cardiomegaly. Thoracic spine fixation. Mild right hemidiaphragm elevation. No pleural effusion or pneumothorax. No congestive failure. No lobar consolidation.  IMPRESSION: Cardiomegaly with resolution of venous congestion.  Stable support apparatus.   Electronically Signed   By: Abigail Miyamoto M.D.   On: 02/12/2015 08:28   Dg Chest Port 1 View  02/11/2015   CLINICAL DATA:  Respiratory failure, acute myocardial infarction, status post coronary intervention  EXAM: PORTABLE CHEST - 1 VIEW  COMPARISON:  02/11/2015  FINDINGS: Left IJ central line tip at the innominate SVC junction. Radiopaque aortic balloon pump tip in the proximal descending thoracic aorta. Lower lung volumes evident. Heart is enlarged with mild diffuse vascular congestion pattern. No focal pneumonia, collapse or consolidation. No effusion or pneumothorax. Thoracic fusion hardware evident.  IMPRESSION: Support apparatus as above.  Low volume exam with mild cardiomegaly and vascular congestion   Electronically Signed   By: Jerilynn Mages.  Shick M.D.   On: 02/11/2015 08:46     Assessment and Plan  59 year old woman with history of hypertension, hyperlipidemia, chronic back pain, presenting with 2 hours of jaw pain radiating to the chest and left shoulder and EKG changes consistent with inferolateral STEMI.   1. Inferior STEMI - cath 02/11/15 revealed 80% mid LAD, 70% D1, occluded mid LCX, 50% mid RCA, 70% distal RCA, acute occlusion of prox RCA s/p DES to prox RCA.This was complicated by hypotension requiring pressor support. Continue ASA/Brilinta/IV  Heparin/statin.Tolerating low dose BB.Per interventional note, once she was stable post MI, goal was to decide whether she needs full revascularization with CABG vs additional PCI. Discussed with Dr. Claiborne Billings who recommends to proceed with relook cath today to evaluate options. Risks and benefits of cardiac catheterization have been discussed with the patient.  These include bleeding, infection, kidney damage, stroke, heart attack, death.  The patient understands these risks and is willing to proceed.  2. Cardiogenic shock with ? RV infarct, improved - remains off pressors/IABP. 2D echo 02/12/15: EF 60-65%, grade 2 DD, high ventricular filling pressure, mild HK of mid-inferoseptal and mid inferior myocardium, mild AI/MR, mod TR, mildly reduced RV function.   3. Hyperglycemia - likely due to stress of MI. A1C 5.3.   4. HTN - given hypotension this admission, meds are being cautiously added.  5. Dyslipidemia - now on high dose statin.  6. NSVT (6 beats 02/13/15) in setting of hypokalemia, hypomagnesemia - lytes were repleted. Continue current regimen and follow.  7. Tobacco abuse - counseled regarding cessation.  8. Normocytic anemia - may be procedurally related.  Stable for the last several days.  Signed, Melina Copa PA-C Pager: [phone]   Patient seen and examined. Agree with assessment and plan. No recurrent chest pain.  For cath today to re-assess anatomy, with possible PCI to Lcx or LAD system if amenable. Will titrate lopressor to 25 mg bid. Discussed avoidance of alderall for her ADHD ( she has not taken for several months).   Troy Sine, MD, [facility] 02/16/2015 9:13 AM

## 2015-02-16 NOTE — Progress Notes (Signed)
TR BAND REMOVAL  LOCATION:    right radial  DEFLATED PER PROTOCOL:    Yes.    TIME BAND OFF / DRESSING APPLIED:    1745   SITE UPON ARRIVAL:    Level 0  SITE AFTER BAND REMOVAL:    Level 0  REVERSE ALLEN'S TEST:     positive  CIRCULATION SENSATION AND MOVEMENT:    Within Normal Limits   Yes.    COMMENTS:   Tolerated procedure well

## 2015-02-16 NOTE — Interval H&P Note (Signed)
History and Physical Interval Note:  02/16/2015 11:48 AM  Catherine Craig  has presented today for surgery, with the diagnosis of cp  The various methods of treatment have been discussed with the patient and family. After consideration of risks, benefits and other options for treatment, the patient has consented to  Procedure(s): Left Heart Cath and Coronary Angiography (N/A) as a surgical intervention .  The patient's history has been reviewed, patient examined, no change in status, stable for surgery.  I have reviewed the patient's chart and labs.  Questions were answered to the patient's satisfaction.    Cath Lab Visit (complete for each Cath Lab visit)  Clinical Evaluation Leading to the Procedure:   ACS: Yes.    Non-ACS:    Anginal Classification: CCS II  Anti-ischemic medical therapy: Maximal Therapy (2 or more classes of medications)  Non-Invasive Test Results: No non-invasive testing performed  Prior CABG: No previous CABG       Catherine Craig [facility] 02/16/2015 11:48 AM

## 2015-02-16 NOTE — H&P (View-Only) (Signed)
Patient: Catherine Craig / Admit Date: 02/11/2015 / Date of Encounter: 02/16/2015, 8:02 AM   Subjective: Feeling good, just anxious to proceed with cath. No CP or SOB.   Objective: Telemetry: NSR, occasional PVCs, brief ventricular trigeminy overnight Physical Exam: Blood pressure 137/88, pulse 85, temperature 97.6 F (36.4 C), temperature source Oral, resp. rate 20, height 5\' 3"  (1.6 m), weight 149 lb 8 oz (67.813 kg), SpO2 98 %. General: Well developed, well nourished WF in no acute distress. Head: Normocephalic, atraumatic, sclera non-icteric, no xanthomas, nares are without discharge. Neck: JVP not elevated. Lungs: Clear bilaterally to auscultation without wheezes, rales, or rhonchi. Breathing is unlabored. Heart: RRR S1 S2 without murmurs, rubs, or gallops.  Abdomen: Soft, non-tender, non-distended with normoactive bowel sounds. No rebound/guarding. Right groin procedure site with mild-mod ecchymosis, no hematoma, soft. Extremities: No clubbing or cyanosis. No edema. Distal pedal pulses are 2+ and equal bilaterally. Neuro: Alert and oriented X 3. Moves all extremities spontaneously. Psych:  Responds to questions appropriately with a normal affect.   Intake/Output Summary (Last 24 hours) at 02/16/15 0802 Last data filed at 02/16/15 0400  Gross per 24 hour  Intake    640 ml  Output      0 ml  Net    640 ml    Inpatient Medications:  . aspirin  81 mg Oral Daily  . aspirin  81 mg Oral Pre-Cath  . Chlorhexidine Gluconate Cloth  6 each Topical Q0600  . magnesium oxide  400 mg Oral Daily  . metoprolol tartrate  12.5 mg Oral BID  . mupirocin ointment  1 application Nasal BID  . nicotine  21 mg Transdermal Daily  . potassium chloride  40 mEq Oral BID  . rosuvastatin  40 mg Oral q1800  . sodium chloride  3 mL Intravenous Q12H  . ticagrelor  90 mg Oral BID   Infusions:  . sodium chloride     Followed by  . sodium chloride    . heparin 1,750 Units/hr (02/16/15 0639)     Labs:  Recent Labs  02/15/15 0250 02/16/15 0250  NA 136 139  K 3.6 3.9  CL 109 110  CO2 18* 20*  GLUCOSE 116* 104*  BUN 8 10  CREATININE 0.89 0.88  CALCIUM 9.3 10.0  MG 1.7 1.9   No results for input(s): AST, ALT, ALKPHOS, BILITOT, PROT, ALBUMIN in the last 72 hours.  Recent Labs  02/15/15 0250 02/16/15 0250  WBC 8.8 8.9  HGB 10.1* 10.8*  HCT 30.3* 32.9*  MCV 80.8 81.0  PLT 195 215    Recent Labs  02/13/15 1042  TROPONINI 12.77*   Invalid input(s): POCBNP No results for input(s): HGBA1C in the last 72 hours.   Radiology/Studies:  Dg Chest 2 View  02/11/2015   CLINICAL DATA:  Left-sided chest pain radiating to the left arm. Jaw pain. Shortness of breath.  EXAM: CHEST  2 VIEW  COMPARISON:  10/28/2011  FINDINGS: The cardiomediastinal contours are normal. Mild bronchial thickening in the upper lobes, right greater than left. Pulmonary vasculature is normal. No consolidation, pleural effusion, or pneumothorax. Posterior fusion with rods and intrapedicular screws, mid-lower thoracic and lumbar spine. No acute osseous abnormalities are seen.  IMPRESSION: Mild upper lobe bronchial thickening.  Otherwise no acute process.   Electronically Signed   By: Jeb Levering M.D.   On: 02/11/2015 02:46   Dg Chest Port 1 View  02/13/2015   CLINICAL DATA:  Encounter for management of intra-aortic balloon pump.  EXAM: PORTABLE CHEST - 1 VIEW  COMPARISON:  02/12/2015  FINDINGS: Stable position of the aortic balloon pump tip in the proximal descending thoracic aorta. Left central line tip in the upper SVC region and unchanged. Again noted is surgical hardware in the thoracic spine. Negative for a pneumothorax. Heart size is stable. Slightly increased interstitial markings in the right lung. Left lung is clear.  IMPRESSION: Stable support apparatuses as described.  Slightly increased interstitial densities in the right lung. Findings could represent mild asymmetric edema.   Electronically  Signed   By: Markus Daft M.D.   On: 02/13/2015 07:45   Dg Chest Port 1 View  02/12/2015   CLINICAL DATA:  Intra aortic balloon pump.  EXAM: PORTABLE CHEST - 1 VIEW  COMPARISON:  One day prior  FINDINGS: Left internal jugular line terminates at the high SVC. Intra aortic balloon pump unchanged in position, with radiopaque portion in the proximal descending aorta. Patient rotated right. Mild cardiomegaly. Thoracic spine fixation. Mild right hemidiaphragm elevation. No pleural effusion or pneumothorax. No congestive failure. No lobar consolidation.  IMPRESSION: Cardiomegaly with resolution of venous congestion.  Stable support apparatus.   Electronically Signed   By: Abigail Miyamoto M.D.   On: 02/12/2015 08:28   Dg Chest Port 1 View  02/11/2015   CLINICAL DATA:  Respiratory failure, acute myocardial infarction, status post coronary intervention  EXAM: PORTABLE CHEST - 1 VIEW  COMPARISON:  02/11/2015  FINDINGS: Left IJ central line tip at the innominate SVC junction. Radiopaque aortic balloon pump tip in the proximal descending thoracic aorta. Lower lung volumes evident. Heart is enlarged with mild diffuse vascular congestion pattern. No focal pneumonia, collapse or consolidation. No effusion or pneumothorax. Thoracic fusion hardware evident.  IMPRESSION: Support apparatus as above.  Low volume exam with mild cardiomegaly and vascular congestion   Electronically Signed   By: Jerilynn Mages.  Shick M.D.   On: 02/11/2015 08:46     Assessment and Plan  59 year old woman with history of hypertension, hyperlipidemia, chronic back pain, presenting with 2 hours of jaw pain radiating to the chest and left shoulder and EKG changes consistent with inferolateral STEMI.   1. Inferior STEMI - cath 02/11/15 revealed 80% mid LAD, 70% D1, occluded mid LCX, 50% mid RCA, 70% distal RCA, acute occlusion of prox RCA s/p DES to prox RCA.This was complicated by hypotension requiring pressor support. Continue ASA/Brilinta/IV  Heparin/statin.Tolerating low dose BB.Per interventional note, once she was stable post MI, goal was to decide whether she needs full revascularization with CABG vs additional PCI. Discussed with Dr. Claiborne Billings who recommends to proceed with relook cath today to evaluate options. Risks and benefits of cardiac catheterization have been discussed with the patient.  These include bleeding, infection, kidney damage, stroke, heart attack, death.  The patient understands these risks and is willing to proceed.  2. Cardiogenic shock with ? RV infarct, improved - remains off pressors/IABP. 2D echo 02/12/15: EF 60-65%, grade 2 DD, high ventricular filling pressure, mild HK of mid-inferoseptal and mid inferior myocardium, mild AI/MR, mod TR, mildly reduced RV function.   3. Hyperglycemia - likely due to stress of MI. A1C 5.3.   4. HTN - given hypotension this admission, meds are being cautiously added.  5. Dyslipidemia - now on high dose statin.  6. NSVT (6 beats 02/13/15) in setting of hypokalemia, hypomagnesemia - lytes were repleted. Continue current regimen and follow.  7. Tobacco abuse - counseled regarding cessation.  8. Normocytic anemia - may be procedurally related.  Stable for the last several days.  Signed, Melina Copa PA-C Pager: [phone]   Patient seen and examined. Agree with assessment and plan. No recurrent chest pain.  For cath today to re-assess anatomy, with possible PCI to Lcx or LAD system if amenable. Will titrate lopressor to 25 mg bid. Discussed avoidance of alderall for her ADHD ( she has not taken for several months).   Troy Sine, MD, [facility] 02/16/2015 9:13 AM

## 2015-02-16 NOTE — Progress Notes (Signed)
ANTICOAGULATION CONSULT NOTE - Follow Up Consult  Pharmacy Consult for Heparin  Indication: chest pain/ACS (considering PCI vs CABG)  Allergies  Allergen Reactions  . Adderall [Amphetamine-Dextroamphetamine] Other (See Comments)    High heart rate, stops taking for surgeries  . Bee Venom Swelling  . Lyrica [Pregabalin] Swelling  . Other Hives    Fluorescein eye drops  . Chantix [Varenicline Tartrate] Nausea And Vomiting    Patient Measurements: Height: 5\' 3"  (160 cm) Weight: 149 lb 8 oz (67.813 kg) IBW/kg (Calculated) : 52.4  Heparin Dosing Weight: 68.5 kg  Vital Signs: Temp: 97.6 F (36.4 C) (09/01 0726) Temp Source: Oral (09/01 0726) BP: 134/77 mmHg (09/01 1016) Pulse Rate: 87 (09/01 1016)  Labs:  Recent Labs  02/14/15 0400  02/15/15 0250  02/15/15 1538 02/15/15 2155 02/16/15 0250  HGB 10.7*  --  10.1*  --   --   --  10.8*  HCT 32.0*  --  30.3*  --   --   --  32.9*  PLT 180  --  195  --   --   --  215  HEPARINUNFRC  --   < >  --   < > 0.37 0.46 0.37  CREATININE 0.89  --  0.89  --   --   --  0.88  < > = values in this interval not displayed.  Estimated Creatinine Clearance: 62.1 mL/min (by C-G formula based on Cr of 0.88).   Assessment: 59 yo woman admitted 02/11/2015 for Acute inferior STEMI s/p DES x 1 to RCA on 02/11/15. Scheduled for cath today to determine further intervention of full revascularization with CABG vs additional PCI.  Heparin level is 0.37, therapeutic on 1750 units/h. CBC stable, no bleeding noted.   Goal of Therapy:  Heparin level 0.3-0.7 units/ml Monitor platelets by anticoagulation protocol: Yes   Plan:  -Continue heparin 1750 units/hr -Daily CBC/HL -Monitor s/sx bleeding -F/U post cath plans   Heloise Ochoa, Pharm.D. PGY2 Cardiology Pharmacy Resident Pager: [phone]  02/16/2015,10:55 AM

## 2015-02-17 ENCOUNTER — Telehealth: Payer: Self-pay | Admitting: *Deleted

## 2015-02-17 ENCOUNTER — Encounter (HOSPITAL_COMMUNITY): Payer: Self-pay | Admitting: Cardiology

## 2015-02-17 LAB — BASIC METABOLIC PANEL
ANION GAP: 8 (ref 5–15)
BUN: 9 mg/dL (ref 6–20)
CALCIUM: 9.8 mg/dL (ref 8.9–10.3)
CO2: 20 mmol/L — ABNORMAL LOW (ref 22–32)
Chloride: 110 mmol/L (ref 101–111)
Creatinine, Ser: 0.97 mg/dL (ref 0.44–1.00)
GLUCOSE: 102 mg/dL — AB (ref 65–99)
POTASSIUM: 4.6 mmol/L (ref 3.5–5.1)
Sodium: 138 mmol/L (ref 135–145)

## 2015-02-17 LAB — CBC
HCT: 30.6 % — ABNORMAL LOW (ref 36.0–46.0)
Hemoglobin: 10.1 g/dL — ABNORMAL LOW (ref 12.0–15.0)
MCH: 26.9 pg (ref 26.0–34.0)
MCHC: 33 g/dL (ref 30.0–36.0)
MCV: 81.4 fL (ref 78.0–100.0)
PLATELETS: 239 10*3/uL (ref 150–400)
RBC: 3.76 MIL/uL — AB (ref 3.87–5.11)
RDW: 15.9 % — ABNORMAL HIGH (ref 11.5–15.5)
WBC: 8.4 10*3/uL (ref 4.0–10.5)

## 2015-02-17 LAB — MAGNESIUM: MAGNESIUM: 2 mg/dL (ref 1.7–2.4)

## 2015-02-17 MED ORDER — ASPIRIN EC 81 MG PO TBEC: 81.0000 mg | DELAYED_RELEASE_TABLET | Freq: Every day | ORAL | Status: AC

## 2015-02-17 MED ORDER — ROSUVASTATIN CALCIUM 40 MG PO TABS: 40.0000 mg | ORAL_TABLET | Freq: Every evening | ORAL | Status: DC

## 2015-02-17 MED ORDER — POTASSIUM CHLORIDE CRYS ER 20 MEQ PO TBCR
40.0000 meq | EXTENDED_RELEASE_TABLET | Freq: Every day | ORAL | Status: DC
  Administered 2015-02-17: 40 meq via ORAL
  Filled 2015-02-17: qty 2

## 2015-02-17 MED ORDER — POTASSIUM CHLORIDE CRYS ER 20 MEQ PO TBCR: 40.0000 meq | EXTENDED_RELEASE_TABLET | Freq: Every day | ORAL | Status: DC

## 2015-02-17 MED ORDER — METOPROLOL TARTRATE 25 MG PO TABS
50.0000 mg | ORAL_TABLET | Freq: Two times a day (BID) | ORAL | Status: DC
  Administered 2015-02-17: 11:00:00 50 mg via ORAL
  Filled 2015-02-17: qty 2

## 2015-02-17 MED ORDER — TICAGRELOR 90 MG PO TABS: 90.0000 mg | ORAL_TABLET | Freq: Two times a day (BID) | ORAL | Status: DC

## 2015-02-17 MED ORDER — METOPROLOL TARTRATE 50 MG PO TABS: 50.0000 mg | ORAL_TABLET | Freq: Two times a day (BID) | ORAL | Status: DC

## 2015-02-17 MED ORDER — MAGNESIUM OXIDE 400 (241.3 MG) MG PO TABS: 400.0000 mg | ORAL_TABLET | Freq: Every day | ORAL | Status: DC

## 2015-02-17 MED ORDER — NITROGLYCERIN 0.4 MG SL SUBL: 0.4000 mg | SUBLINGUAL_TABLET | SUBLINGUAL | Status: AC | PRN

## 2015-02-17 NOTE — Progress Notes (Signed)
Patient: [facility] / Admit Date: 02/11/2015 / Date of Encounter: 02/17/2015, 7:14 AM   Subjective: Feels "fantastic." No CP or SOB. Actually got some sleep last night!! Room was quiet.   Objective: Telemetry: NSR, very brief run of vent trgimeny overnight Physical Exam: Blood pressure 112/56, pulse 95, temperature 97.5 F (36.4 C), temperature source Oral, resp. rate 18, height 5\' 3"  (1.6 m), weight 151 lb 14.4 oz (68.9 kg), SpO2 100 %. General: Well developed, well nourished WF in no acute distress. Head: Normocephalic, atraumatic, sclera non-icteric, no xanthomas, nares are without discharge. Neck: JVP not elevated. Lungs: Clear bilaterally to auscultation without wheezes, rales, or rhonchi. Breathing is unlabored. Heart: RRR S1 S2 without murmurs, rubs, or gallops.  Abdomen: Soft, non-tender, non-distended with normoactive bowel sounds. No rebound/guarding. Right groin procedure site with mild resolving ecchymosis, no hematoma, soft. Extremities: No clubbing or cyanosis. No edema. Distal pedal pulses are 2+ and equal bilaterally. Right radial cath site without ecchymosis or hematoma; good pusle. Neuro: Alert and oriented X 3. Moves all extremities spontaneously. Psych: Responds to questions appropriately with a normal affect.   Intake/Output Summary (Last 24 hours) at 02/17/15 0714 Last data filed at 02/17/15 0400  Gross per 24 hour  Intake  794.9 ml  Output   1000 ml  Net -205.1 ml    Inpatient Medications:  . aspirin  81 mg Oral Daily  . Chlorhexidine Gluconate Cloth  6 each Topical Q0600  . heparin  5,000 Units Subcutaneous 3 times per day  . magnesium oxide  400 mg Oral Daily  . metoprolol tartrate  25 mg Oral BID  . mupirocin ointment  1 application Nasal BID  . nicotine  21 mg Transdermal Daily  . potassium chloride  40 mEq Oral Daily  . rosuvastatin  40 mg Oral q1800  . sodium chloride  3 mL Intravenous Q12H  . sodium chloride  3 mL Intravenous Q12H  .  ticagrelor  90 mg Oral BID   Infusions:    Labs:  Recent Labs  02/16/15 0250 02/16/15 1630 02/17/15 0246  NA 139  --  138  K 3.9  --  4.6  CL 110  --  110  CO2 20*  --  20*  GLUCOSE 104*  --  102*  BUN 10  --  9  CREATININE 0.88 0.91 0.97  CALCIUM 10.0  --  9.8  MG 1.9  --  2.0   No results for input(s): AST, ALT, ALKPHOS, BILITOT, PROT, ALBUMIN in the last 72 hours.  Recent Labs  02/16/15 1630 02/17/15 0246  WBC 8.6 8.4  HGB 10.8* 10.1*  HCT 33.6* 30.6*  MCV 81.4 81.4  PLT 242 239   No results for input(s): CKTOTAL, CKMB, TROPONINI in the last 72 hours. Invalid input(s): POCBNP No results for input(s): HGBA1C in the last 72 hours.   Radiology/Studies:  Dg Chest 2 View  02/11/2015   CLINICAL DATA:  Left-sided chest pain radiating to the left arm. Jaw pain. Shortness of breath.  EXAM: CHEST  2 VIEW  COMPARISON:  10/28/2011  FINDINGS: The cardiomediastinal contours are normal. Mild bronchial thickening in the upper lobes, right greater than left. Pulmonary vasculature is normal. No consolidation, pleural effusion, or pneumothorax. Posterior fusion with rods and intrapedicular screws, mid-lower thoracic and lumbar spine. No acute osseous abnormalities are seen.  IMPRESSION: Mild upper lobe bronchial thickening.  Otherwise no acute process.   Electronically Signed   By: Jeb Levering M.D.   On: 02/11/2015  02:46   Dg Chest Port 1 View  02/13/2015   CLINICAL DATA:  Encounter for management of intra-aortic balloon pump.  EXAM: PORTABLE CHEST - 1 VIEW  COMPARISON:  02/12/2015  FINDINGS: Stable position of the aortic balloon pump tip in the proximal descending thoracic aorta. Left central line tip in the upper SVC region and unchanged. Again noted is surgical hardware in the thoracic spine. Negative for a pneumothorax. Heart size is stable. Slightly increased interstitial markings in the right lung. Left lung is clear.  IMPRESSION: Stable support apparatuses as described.   Slightly increased interstitial densities in the right lung. Findings could represent mild asymmetric edema.   Electronically Signed   By: Markus Daft M.D.   On: 02/13/2015 07:45   Dg Chest Port 1 View  02/12/2015   CLINICAL DATA:  Intra aortic balloon pump.  EXAM: PORTABLE CHEST - 1 VIEW  COMPARISON:  One day prior  FINDINGS: Left internal jugular line terminates at the high SVC. Intra aortic balloon pump unchanged in position, with radiopaque portion in the proximal descending aorta. Patient rotated right. Mild cardiomegaly. Thoracic spine fixation. Mild right hemidiaphragm elevation. No pleural effusion or pneumothorax. No congestive failure. No lobar consolidation.  IMPRESSION: Cardiomegaly with resolution of venous congestion.  Stable support apparatus.   Electronically Signed   By: Abigail Miyamoto M.D.   On: 02/12/2015 08:28   Dg Chest Port 1 View  02/11/2015   CLINICAL DATA:  Respiratory failure, acute myocardial infarction, status post coronary intervention  EXAM: PORTABLE CHEST - 1 VIEW  COMPARISON:  02/11/2015  FINDINGS: Left IJ central line tip at the innominate SVC junction. Radiopaque aortic balloon pump tip in the proximal descending thoracic aorta. Lower lung volumes evident. Heart is enlarged with mild diffuse vascular congestion pattern. No focal pneumonia, collapse or consolidation. No effusion or pneumothorax. Thoracic fusion hardware evident.  IMPRESSION: Support apparatus as above.  Low volume exam with mild cardiomegaly and vascular congestion   Electronically Signed   By: Jerilynn Mages.  Shick M.D.   On: 02/11/2015 08:46     Assessment and Plan  59 year old woman with history of hypertension, hyperlipidemia, chronic back pain, presenting with 2 hours of jaw pain radiating to the chest and left shoulder and EKG changes consistent with inferolateral STEMI.   1.Inferior STEMI/CAD - cath 02/11/15 revealed 80% mid LAD, 70% D1, chronically occluded mid LCX, 50% mid RCA, 70% distal RCA, acute occlusion  of prox RCA s/p DES to prox RCA that day.This was complicated by hypotension requiring pressor support. Went back to cath lab 02/16/15 -> s/p DES to mLAD, DES to distal RCA.   2. Cardiogenic shock with ? RV infarct, improved - off pressors/IABP for several days now. 2D echo 02/12/15: EF 60-65%, grade 2 DD, high ventricular filling pressure, mild HK of mid-inferoseptal and mid inferior myocardium, mild AI/MR, mod TR, mildly reduced RV function.   3.Hyperglycemia - likely due to stress of MI. A1C 5.3.   4.HTN - given hypotension this admission, meds are being cautiously added.  5.Dyslipidemia - now on high dose statin.  6. NSVT (6 beats 02/13/15) in setting of hypokalemia, hypomagnesemia - lytes were repleted. Will drop KCl to once daily given uptrend.  7. Tobacco abuse - counseled regarding cessation.  8. Normocytic anemia - may be procedurally related. Stable comparative to rest of recent hospitalization.  Will need 30-day Brilinta rx and Brilinta assist form filled out - I have done this and placed on chart. ?D/c today. Await MD input.  Signed, Melina Copa PA-C Pager: [phone]  Patient seen and examined. Agree with assessment and plan. Feels much better. Reviewed cines from yesterday. New stent in LAD and distal RCA with patent proximal RCA stent.  Medical therapy for chronic occlusion of LCX. Increase lopressor to 50 mg bid with HR 94 on 25 mg bid. DC this afternoon.   Troy Sine, MD, [facility] 02/17/2015 10:51 AM

## 2015-02-17 NOTE — Progress Notes (Signed)
CARDIAC REHAB PHASE I   PRE:  Rate/Rhythm: 91 SR    BP: sitting 113/75    SaO2:   MODE:  Ambulation: 400 ft   POST:  Rate/Rhythm: 113 ST    BP: sitting 125/95     SaO2:   Pt walked without RW (none available). Steady but back gets tired. SOB x2 with rest. HR elevated. Ed completed with good reception. Plans to quit smoking. Interested in [place] and will send referral to [facility]. Understands importance of Brilinta but will need assistance due to her insurance not covering brand names. [id number]   Josephina Shih Cobre CES, ACSM 02/17/2015 10:23 AM

## 2015-02-17 NOTE — Discharge Summary (Signed)
Discharge Summary   Patient ID: [facility] MRN: [id number], DOB/AGE: [birth year] 59 y.o. Admit date: 02/11/2015 D/C date:     02/17/2015  Primary Care Provider: Nyoka Cowden, MD Primary Cardiologist: Dr. Daneen Schick  Primary Discharge Diagnoses:  1.Inferior STEMI/CAD  - initial cath 02/11/15 revealed 80% mid LAD, 70% D1, chronically occluded mid LCX, 50% mid RCA, 70% distal RCA, acute occlusion of prox RCA s/p DES to prox RCA that day - back to cath lab 02/16/15 -> s/p DES to mLAD, DES to distal RCA. Med rx for chronically occluded mLCx. 2. Cardiogenic shock with ? RV infarct, improved, requiring pressors/IABP 3.Hyperglycemia, likely due to stress of MI, A1C 5.3 4.H/o HTN 5.Dyslipidemia 6. NSVT (6 beats 02/13/15) in setting of hypokalemia, hypomagnesemia  7. Tobacco abuse 8. Normocytic anemia 9. Mild AI/MR, mod TR by echo 02/12/15  Secondary Discharge Diagnoses:  1. Chronic back pain  [facility] Course: Catherine [facility] is a 59 y/o F with history of HTN, HLD, and chronic back pain who presented to [facility]2015 with 2 hours of jaw pain radiating to her chest. She had actually initially presented to [facility] where she was found to have inferior ST elevation by EKG. Code STEMI was activated and she was transferred to [facility] for emergent left heart catheterization. She received ASA, heparin, and SL NTG - she became hypotensive after the SL NTG which improved with IV fluid. Cardiac cath 02/11/15 showed 80% mLAD, 70% D1, 100% mid-distal Cx, 50% mRCA, 70% dRCA, 100% pRCA. The STEMI was felt due acute occasion of the proximal RCA, which was treated with DES placement. LV gram showed EF 50%, LVEDP 9mmHg. She required placement of intra-aortic balloon pump via the right femoral artery to treat persistent severe hypotension/cardiogenic shock possibly due to RV infarct. She was started on aspirin and Brilinta. She was also treated with aggressive IV hydration and dopamine to  support her pressure. The recommendation was to consider further revascularization with CABG vs PCI once clinically improved.   The day after her procedure she complained of some left arm pain but this was focally at her IV site and felt to be musculoskeletal in etiology. She had no recurrent jaw or chest pain. Troponin continued to trend down. 2D echo 02/12/15: EF 60-65%, grade 2 DD, high ventricular filling pressure, mild HK of the mid inferoseptal and mid inferior myocardium, mild AI, mild MR, mildly dilated LA/RV, mod TR, RV function mildly reduced. IV fluids were scaled back and dopamine was able to be weaned. By 02/13/15 her IABP was weaned to 1:3 and then eventually off. She did have 6 beats of NSVT this day in the setting of hypokalemia/hypomagnesemia, thus lytes were repleted and followed. Smoking cessation was advised. High dose statin was started. Once balloon pump was out and pressure was holding, beta blocker was added. As her activity progressed over the next few days this was titrated. She was noted to have normocytic anemia but this was felt procedurally related - patient denied any evidence of bleeding, and Hgb remained generally stable over several days' time. She went back to the cath lab as previously planned for re-look cath on 02/16/15 and underwent DES to mLAD, DES to distal RCA. Med rx was recommended for chronic occlusion of LCx. She did well post-procedurally. She feels good today. She is hemodynamically stable. As RV has improved she was cleared by Dr. Claiborne Billings to receive SL NTG PRN RX at discharge. She also was given 30 day Brilinta free card  along with regular rx refills, and the assistance paperwork was filled out. Info was sent to [facility] for phase II. Dr. Claiborne Billings has seen and examined the patient today and feels she is stable for discharge.   Please see below for instructions given to patient at d/c including discontinuation of naproxen & Adderall. Given normocytic anemia this  admission as well as low K/Mg would consider repeat CBC/BMET/Mg at f/u appt. If the patient is tolerating statin at time of follow-up appointment, would consider rechecking liver function/lipid panel in 6-8 weeks.   Discharge Vitals: Blood pressure 140/88, pulse 86, temperature 96.2 F (35.7 C), temperature source Oral, resp. rate 18, height 5\' 3"  (1.6 m), weight 151 lb 14.4 oz (68.9 kg), SpO2 100 %.  Labs: Lab Results  Component Value Date   WBC 8.4 02/17/2015   HGB 10.1* 02/17/2015   HCT 30.6* 02/17/2015   MCV 81.4 02/17/2015   PLT 239 02/17/2015    Recent Labs Lab 02/17/15 0246  NA 138  K 4.6  CL 110  CO2 20*  BUN 9  CREATININE 0.97  CALCIUM 9.8  GLUCOSE 102*    Lab Results  Component Value Date   CHOL 149 02/11/2015   HDL 39* 02/11/2015   LDLCALC 76 02/11/2015   TRIG 171* 02/11/2015     Diagnostic Studies/Procedures   Dg Chest 2 View  02/11/2015   CLINICAL DATA:  Left-sided chest pain radiating to the left arm. Jaw pain. Shortness of breath.  EXAM: CHEST  2 VIEW  COMPARISON:  10/28/2011  FINDINGS: The cardiomediastinal contours are normal. Mild bronchial thickening in the upper lobes, right greater than left. Pulmonary vasculature is normal. No consolidation, pleural effusion, or pneumothorax. Posterior fusion with rods and intrapedicular screws, mid-lower thoracic and lumbar spine. No acute osseous abnormalities are seen.  IMPRESSION: Mild upper lobe bronchial thickening.  Otherwise no acute process.   Electronically Signed   By: Jeb Levering M.D.   On: 02/11/2015 02:46   Dg Chest Port 1 View  02/13/2015   CLINICAL DATA:  Encounter for management of intra-aortic balloon pump.  EXAM: PORTABLE CHEST - 1 VIEW  COMPARISON:  02/12/2015  FINDINGS: Stable position of the aortic balloon pump tip in the proximal descending thoracic aorta. Left central line tip in the upper SVC region and unchanged. Again noted is surgical hardware in the thoracic spine. Negative for a  pneumothorax. Heart size is stable. Slightly increased interstitial markings in the right lung. Left lung is clear.  IMPRESSION: Stable support apparatuses as described.  Slightly increased interstitial densities in the right lung. Findings could represent mild asymmetric edema.   Electronically Signed   By: Markus Daft M.D.   On: 02/13/2015 07:45   Dg Chest Port 1 View  02/12/2015   CLINICAL DATA:  Intra aortic balloon pump.  EXAM: PORTABLE CHEST - 1 VIEW  COMPARISON:  One day prior  FINDINGS: Left internal jugular line terminates at the high SVC. Intra aortic balloon pump unchanged in position, with radiopaque portion in the proximal descending aorta. Patient rotated right. Mild cardiomegaly. Thoracic spine fixation. Mild right hemidiaphragm elevation. No pleural effusion or pneumothorax. No congestive failure. No lobar consolidation.  IMPRESSION: Cardiomegaly with resolution of venous congestion.  Stable support apparatus.   Electronically Signed   By: Abigail Miyamoto M.D.   On: 02/12/2015 08:28   Dg Chest Port 1 View  02/11/2015   CLINICAL DATA:  Respiratory failure, acute myocardial infarction, status post coronary intervention  EXAM: PORTABLE  CHEST - 1 VIEW  COMPARISON:  02/11/2015  FINDINGS: Left IJ central line tip at the innominate SVC junction. Radiopaque aortic balloon pump tip in the proximal descending thoracic aorta. Lower lung volumes evident. Heart is enlarged with mild diffuse vascular congestion pattern. No focal pneumonia, collapse or consolidation. No effusion or pneumothorax. Thoracic fusion hardware evident.  IMPRESSION: Support apparatus as above.  Low volume exam with mild cardiomegaly and vascular congestion   Electronically Signed   By: Jerilynn Mages.  Shick M.D.   On: 02/11/2015 08:46   Cardiac catheterization this admission, please see full report and above for summary.  2D echo 02/12/15 - Left ventricle: The cavity size was normal. Wall thickness was normal. Systolic function was normal.  The estimated ejection fraction was in the range of 60% to 65%. Features are consistent with a pseudonormal left ventricular filling pattern, with concomitant abnormal relaxation and increased filling pressure (grade 2 diastolic dysfunction). Doppler parameters are consistent with high ventricular filling pressure. Ratio of mitral valve peak E velocity to medial annulus E (e&') velocity: 19.4. - Regional wall motion abnormality: Mild hypokinesis of the mid inferoseptal and mid inferior myocardium. - Aortic valve: There was mild regurgitation. - Mitral valve: There was mild regurgitation. - Left atrium: The atrium was mildly dilated. - Right ventricle: Systolic function was mildly reduced. - Tricuspid valve: There was moderate regurgitation.  Discharge Medications   Current Discharge Medication List    START taking these medications   Details  aspirin EC 81 MG tablet Take 1 tablet (81 mg total) by mouth daily. Qty: 30 tablet, Refills: 11    magnesium oxide (MAG-OX) 400 (241.3 MG) MG tablet Take 1 tablet (400 mg total) by mouth daily. Qty: 30 tablet, Refills: 6    metoprolol tartrate (LOPRESSOR) 50 MG tablet Take 1 tablet (50 mg total) by mouth 2 (two) times daily. Qty: 60 tablet, Refills: 6    nitroGLYCERIN (NITROSTAT) 0.4 MG SL tablet Place 1 tablet (0.4 mg total) under the tongue every 5 (five) minutes as needed for chest pain (up to 3 doses). Qty: 25 tablet, Refills: 3    potassium chloride SA (K-DUR,KLOR-CON) 20 MEQ tablet Take 2 tablets (40 mEq total) by mouth daily. Qty: 60 tablet, Refills: 6    ticagrelor (BRILINTA) 90 MG TABS tablet Take 1 tablet (90 mg total) by mouth 2 (two) times daily. Qty: 60 tablet, Refills: 11      CONTINUE these medications which have CHANGED   Details  rosuvastatin (CRESTOR) 40 MG tablet Take 1 tablet (40 mg total) by mouth every evening. Qty: 30 tablet, Refills: 6      CONTINUE these medications which have NOT CHANGED    Details  b complex vitamins tablet Take 1 tablet by mouth at bedtime.    Calcium Citrate-Vitamin D (CITRACAL + D PO) Take 1 tablet by mouth 2 (two) times daily.    Coenzyme Q10 (COQ-10) 200 MG CAPS Take 1 tablet by mouth 2 (two) times daily.    [employer] SENNA PLUS 8.6-50 MG per tablet Take 1 tablet by mouth daily. Refills: 0    cyclobenzaprine (FLEXERIL) 10 MG tablet TAKE 1 TABLET BY MOUTH 3 TIMES A DAY AS NEEDED FOR MUSCLE SPASM     gabapentin (NEURONTIN) 400 MG capsule TAKE ONE CAPSULE AT BEDTIME AS NEEDED     Multiple Vitamin (MULTIVITAMIN WITH MINERALS) TABS Take 0.5 tablets by mouth 2 (two) times daily.    oxyCODONE-acetaminophen (PERCOCET) 10-325 MG per tablet Take by mouth every 6 (six)  hours as needed for pain. for pain Refills: 0    traMADol (ULTRAM) 50 MG tablet Take 1 tablet (50 mg total) by mouth every 6 (six) hours as needed. Qty: 120 tablet, Refills: 2    vitamin C (ASCORBIC ACID) 500 MG tablet Take 500 mg by mouth 2 (two) times daily.      STOP taking these medications     amphetamine-dextroamphetamine (ADDERALL) 10 MG tablet      fexofenadine-pseudoephedrine (ALLEGRA-D) 60-120 MG per tablet      furosemide (LASIX) 20 MG tablet      KRILL OIL PO      MAGNESIUM CITRATE PO      naproxen sodium (ANAPROX) 220 MG tablet         Aspirin prescribed at discharge:  Yes  High Intensity Statin Prescribed? (Lipitor 40-80mg  or Crestor 20-40mg ): Yes  Beta Blocker Prescribed: Yes  ADP Receptor Inhibitor Prescribed? (i.e. Plavix etc.-Includes Medically Managed Patients): Yes  For EF <40%, Aldosterone Inhibitor Prescribed? No: EF not <40%  Was EF assessed during THIS hospitalization? Yes  Was [facility] ordered? (Included Medically managed Patients): Yes    Disposition   The patient will be discharged in stable condition to home. Discharge Instructions    Amb Referral to Cardiac Rehabilitation    Complete by:  As directed   Congestive Heart Failure: If  diagnosis is Heart Failure, patient MUST meet each of the CMS criteria: 1. Left Ventricular Ejection Fraction </= 35% 2. NYHA class II-IV symptoms despite being on optimal heart failure therapy for at least 6 weeks. 3. Stable = have not had a recent (<6 weeks) or planned (<6 months) major cardiovascular hospitalization or procedure  Program Details: - Physician supervised classes - 1-3 classes per week over a 12-18 week period, generally for a total of 36 sessions  Physician Certification: I certify that the above Cardiac Rehabilitation treatment is medically necessary and is medically approved by me for treatment of this patient. The patient is willing and cooperative, able to ambulate and medically stable to participate in exercise rehabilitation. The participant's progress and Individualized Treatment Plan will be reviewed by the Medical Director, [facility] staff and as indicated by the Referring/Ordering Physician.  Diagnosis:   Myocardial Infarction PCI       Diet - low sodium heart healthy    Complete by:  As directed      Increase activity slowly    Complete by:  As directed   No driving for 2 weeks. No lifting over 10 lbs for 4 weeks. No sexual activity for 4 weeks. You may not return to work until cleared by your cardiologist. Keep procedure site clean & dry. If you notice increased pain, swelling, bleeding or pus, call/return!  You may shower, but no soaking baths/hot tubs/pools for 1 week.  Patients with heart disease should avoid stimulant-type medicines. This includes medicines like Adderall or even cold/allergy medicines that contain pseudoephedrine or phenylephrine. (Sometimes on the bottle it will say "-D" to indicate the decongestant, which you'll need to avoid.) Please make sure to pay attention to labels.  Patients taking aspirin and Brilinta should generally stay away from medicines like ibuprofen, Advil, Motrin, naproxen, and Aleve due to risk of stomach bleeding. You  may take Tylenol as directed or talk to your primary doctor about alternatives.  There have been several medicine changes made during your [facility] stay. Please review the final medicine list above. We have stopped your Krill oil, furosemide/Lasix, Allegra-D, naproxen, magnesium citrate,  and Adderall. New prescriptions include Crestor, Brilinta, magnesium oxide, potassium, metoprolol, aspirin, and nitroglycerin.          Follow-up Information    Follow up with [facility], NP.   Specialties:  Cardiology, Radiology   Why:  [place] Office - 02/24/15 at 11am.   Contact information:   [address] [place] [address] [phone]         Duration of Discharge Encounter: Greater than 30 minutes including physician and PA time.  Rudean Hitt, [place] PA-C 02/17/2015, 2:41 PM

## 2015-02-17 NOTE — Telephone Encounter (Signed)
1 WEEK TOC FU APPT -APPT 02-24-15 WITH LAURA

## 2015-02-21 ENCOUNTER — Telehealth: Payer: Self-pay | Admitting: Interventional Cardiology

## 2015-02-21 NOTE — Telephone Encounter (Signed)
Adv pt that a message has been fwd to [place]. We will call back with his response. Pt verbalized understanding.

## 2015-02-21 NOTE — Telephone Encounter (Signed)
Returned pt call. Pt sts that she has questions about her d/c meds. Pt sts that she was recovering from back sx when she had her heart attack. Pt would like to know if it is ok to continue Flexeril and Tramadol. Adv pt I will talk with a provider in the office and call back. Pt verbalized understanding.

## 2015-02-21 NOTE — Telephone Encounter (Signed)
It would be okay to use tramadol and Flexeril

## 2015-02-21 NOTE — Telephone Encounter (Signed)
Patient contacted regarding discharge from [facility] 9/2   Patient understands to follow up with provider ? 9/9 with Cecilie Kicks, NP [phone].  Patient understands discharge instructions? Yes  Patient understands medications and regiment? Yes, pt expecting call back r/t to her Tramadol and Flexaril Patient understands to bring all medications to this visit? Yes; pt knows to bring medications with her to appointment

## 2015-02-21 NOTE — Telephone Encounter (Signed)
New Message       Pt calling stating that Dr. Tamala Julian did a Cath on her and when she was d/c from the hospital she wasn't clear about her medications and she needs some clarification. Please call back and advise.

## 2015-02-22 ENCOUNTER — Encounter: Payer: Self-pay | Admitting: *Deleted

## 2015-02-22 NOTE — Telephone Encounter (Signed)
Pt aware of Dr.Smith's response It would be okay to use tramadol and Flexeril Pt appreciative for the call back and verbalized understanding.

## 2015-02-24 ENCOUNTER — Encounter: Payer: Self-pay | Admitting: Cardiology

## 2015-02-24 ENCOUNTER — Ambulatory Visit (INDEPENDENT_AMBULATORY_CARE_PROVIDER_SITE_OTHER): Payer: BLUE CROSS/BLUE SHIELD | Admitting: Cardiology

## 2015-02-24 VITALS — BP 110/75 | HR 67 | Ht 63.0 in | Wt 147.0 lb

## 2015-02-24 DIAGNOSIS — E876 Hypokalemia: Secondary | ICD-10-CM

## 2015-02-24 DIAGNOSIS — I2119 ST elevation (STEMI) myocardial infarction involving other coronary artery of inferior wall: Secondary | ICD-10-CM | POA: Diagnosis not present

## 2015-02-24 DIAGNOSIS — D62 Acute posthemorrhagic anemia: Secondary | ICD-10-CM

## 2015-02-24 DIAGNOSIS — I2510 Atherosclerotic heart disease of native coronary artery without angina pectoris: Secondary | ICD-10-CM | POA: Diagnosis not present

## 2015-02-24 DIAGNOSIS — I2129 ST elevation (STEMI) myocardial infarction involving other sites: Secondary | ICD-10-CM

## 2015-02-24 DIAGNOSIS — I10 Essential (primary) hypertension: Secondary | ICD-10-CM

## 2015-02-24 DIAGNOSIS — I219 Acute myocardial infarction, unspecified: Secondary | ICD-10-CM

## 2015-02-24 DIAGNOSIS — E785 Hyperlipidemia, unspecified: Secondary | ICD-10-CM

## 2015-02-24 LAB — CBC WITH DIFFERENTIAL/PLATELET
BASOS ABS: 0.1 10*3/uL (ref 0.0–0.1)
BASOS PCT: 0.6 % (ref 0.0–3.0)
EOS PCT: 4.9 % (ref 0.0–5.0)
Eosinophils Absolute: 0.8 10*3/uL — ABNORMAL HIGH (ref 0.0–0.7)
HEMATOCRIT: 37.2 % (ref 36.0–46.0)
Hemoglobin: 12.4 g/dL (ref 12.0–15.0)
LYMPHS ABS: 3.3 10*3/uL (ref 0.7–4.0)
LYMPHS PCT: 20.3 % (ref 12.0–46.0)
MCHC: 33.4 g/dL (ref 30.0–36.0)
MCV: 81.8 fl (ref 78.0–100.0)
MONOS PCT: 5.7 % (ref 3.0–12.0)
Monocytes Absolute: 0.9 10*3/uL (ref 0.1–1.0)
NEUTROS ABS: 11.2 10*3/uL — AB (ref 1.4–7.7)
NEUTROS PCT: 68.5 % (ref 43.0–77.0)
PLATELETS: 402 10*3/uL — AB (ref 150.0–400.0)
RBC: 4.54 Mil/uL (ref 3.87–5.11)
RDW: 18.3 % — ABNORMAL HIGH (ref 11.5–15.5)
WBC: 16.4 10*3/uL — ABNORMAL HIGH (ref 4.0–10.5)

## 2015-02-24 LAB — BASIC METABOLIC PANEL
BUN: 29 mg/dL — ABNORMAL HIGH (ref 6–23)
CALCIUM: 10.5 mg/dL (ref 8.4–10.5)
CHLORIDE: 106 meq/L (ref 96–112)
CO2: 23 meq/L (ref 19–32)
Creatinine, Ser: 1.25 mg/dL — ABNORMAL HIGH (ref 0.40–1.20)
GFR: 46.24 mL/min — ABNORMAL LOW (ref >60.00)
GLUCOSE: 104 mg/dL — AB (ref 70–99)
POTASSIUM: 4.9 meq/L (ref 3.5–5.1)
SODIUM: 135 meq/L (ref 135–145)

## 2015-02-24 LAB — MAGNESIUM: Magnesium: 2.5 mg/dL (ref 1.5–2.5)

## 2015-02-24 NOTE — Patient Instructions (Signed)
Medication Instructions:  Your physician recommends that you continue on your current medications as directed. Please refer to the Current Medication list given to you today.   Labwork: TODAY:  BMP, CBC, MAGNESIUM  03-31-15: KIDNEY FUNCTION AND LIPID PANEL  Testing/Procedures: None ordered  Follow-Up: Your physician recommends that you schedule a follow-up appointment in: [place] [facility] Your physician recommends that you schedule a follow-up appointment WITH DR. Tamala Julian AT HIS 1ST AVAILABLE OPENING.     Any Other Special Instructions Will Be Listed Below (If Applicable).

## 2015-02-24 NOTE — Progress Notes (Signed)
Cardiology Office Note   Date:  02/24/2015   ID:  Catherine Craig, DOB Dec 23, 1955, MRN [id number]  PCP:  Nyoka Cowden, MD  Cardiologist:  Dr. Tamala Julian    Chief Complaint  Patient presents with  . Hospitalization Follow-up      History of Present Illness: Catherine Craig is a 59 y.o. female who presents for hospital follow up for STEMI  D/c 02/17/15- she has a history of HTN, HLD, and chronic back pain and had undergone recent surgery s/p T8-L2 fusion performed on 12-01-14 adjacent to a L2-S1 fusion,  who presented to [facility] 02/11/2015 with 2 hours of jaw pain radiating to her chest. She had actually initially presented to [facility] where she was found to have inferior ST elevation by EKG.  She underwent emergent Stent to prox RCA and was placed on IABP.  She did have residual disease and prior to discharge underwent stent to distal RCA and to mLAD.  Her troponin pk of 40.  D/c'd with BB, DAPT, magnesium and statin.   Since discharge she has initially felt very weak and SOB after climbing stairs.  This has now resolved.  She is feeling better.  She is walking 5 min a day for now, eating healthy and has stopped smoking - all but one cigarette since discharge.  She has a nicoderm patch in place 21 mg and knows not to smoke with patch in place. She is taking meds as prescribed. We discussed cardiac rehab and her PT for her back.      Past Medical History  Diagnosis Date  . ADHD   . Allergic rhinitis   . Basal cell carcinoma   . Fibromyalgia   . GERD   . Dyslipidemia   . Essential hypertension   . MENOPAUSAL SYNDROME 12/15/2006  . Osteoarthritis   . Renal colic 10/13/7679  . VAGINITIS, ATROPHIC 01/05/2007  . Histoplasmosis with retinitis   . Anxiety   . IBS (irritable bowel syndrome)   . Anal fissure   . Thoracic outlet syndrome   . Lipoma     on chest  . CAD (coronary artery disease)     a. Inferior STEMI -  initial cath 02/11/15 revealed 80% mid LAD, 70% D1,  chronically occluded mid LCX, 50% mid RCA, 70% distal RCA, acute occlusion of prox RCA s/p DES to prox RCA. Planned re-cath 02/16/15 s/p DES to mLAD, DES to distal RCA. Med rx for chronically occluded mLCx.  . Cardiogenic shock     a. 01/2015 in setting of inferior STEMI, possibly 2/2 cardiogenic shock.  . NSVT (nonsustained ventricular tachycardia)     a. 6 beats 01/2015 after STEMI in setting of low K, Mg.  . Tobacco abuse   . Normocytic anemia   . Valvular heart disease     a. Mild AI/MR, mod TR by echo 02/12/15.  Marland Kitchen Hypokalemia   . Hypomagnesemia     Past Surgical History  Procedure Laterality Date  . Appendectomy  1973  . Tonsillectomy  1980  . Dilation and curettage of uterus  1982  . Tonsillectomy    . Eye surgery      Lazer to right x 6  . Spine surgery  04/16/2012  . Cardiac catheterization N/A 02/11/2015    Procedure: Left Heart Cath and Coronary Angiography;  Surgeon: Belva Crome, MD;  Location: [place] [facility];  Service: Cardiovascular;  Laterality: N/A;  . Cardiac catheterization N/A 02/11/2015    Procedure: Coronary Stent Intervention;  Surgeon: Belva Crome, MD;  Location: [place] [facility];  Service: Cardiovascular;  Laterality: N/A;  . Cardiac catheterization N/A 02/16/2015    Procedure: Coronary Stent Intervention;  Surgeon: Peter M Martinique, MD;  Location: [place] [facility];  Service: Cardiovascular;  Laterality: N/A;     Current Outpatient Prescriptions  Medication Sig Dispense Refill  . aspirin EC 81 MG tablet Take 1 tablet (81 mg total) by mouth daily. 30 tablet 11  . b complex vitamins tablet Take 1 tablet by mouth at bedtime.    . Calcium Citrate-Vitamin D (CITRACAL + D PO) Take 1 tablet by mouth 2 (two) times daily.    . Coenzyme Q10 (COQ-10) 200 MG CAPS Take 1 tablet by mouth 2 (two) times daily.    . CVS SENNA PLUS 8.6-50 MG per tablet Take 1 tablet by mouth daily.  0  . cyclobenzaprine (FLEXERIL) 10 MG tablet TAKE 1 TABLET BY MOUTH 3 TIMES A DAY AS NEEDED  FOR MUSCLE SPASM (Patient taking differently: TAKE 10 mg three (3) times a day and 20 mg at night FOR MUSCLE SPASM) 90 tablet 2  . gabapentin (NEURONTIN) 400 MG capsule TAKE ONE CAPSULE AT BEDTIME AS NEEDED (Patient taking differently: TAKE ONE CAPSULE AT BEDTIME) 30 capsule 5  . magnesium oxide (MAG-OX) 400 (241.3 MG) MG tablet Take 1 tablet (400 mg total) by mouth daily. 30 tablet 6  . metoprolol tartrate (LOPRESSOR) 50 MG tablet Take 1 tablet (50 mg total) by mouth 2 (two) times daily. 60 tablet 6  . Multiple Vitamin (MULTIVITAMIN WITH MINERALS) TABS Take 0.5 tablets by mouth 2 (two) times daily.    . nitroGLYCERIN (NITROSTAT) 0.4 MG SL tablet Place 1 tablet (0.4 mg total) under the tongue every 5 (five) minutes as needed for chest pain (up to 3 doses). 25 tablet 3  . oxyCODONE-acetaminophen (PERCOCET) 10-325 MG per tablet Take by mouth every 6 (six) hours as needed for pain. for pain  0  . potassium chloride SA (K-DUR,KLOR-CON) 20 MEQ tablet Take 2 tablets (40 mEq total) by mouth daily. 60 tablet 6  . rosuvastatin (CRESTOR) 40 MG tablet Take 1 tablet (40 mg total) by mouth every evening. 30 tablet 6  . ticagrelor (BRILINTA) 90 MG TABS tablet Take 1 tablet (90 mg total) by mouth 2 (two) times daily. 60 tablet 11  . traMADol (ULTRAM) 50 MG tablet Take 1 tablet (50 mg total) by mouth every 6 (six) hours as needed. (Patient taking differently: Take 50 mg by mouth every 6 (six) hours as needed for moderate pain. ) 120 tablet 2  . vitamin C (ASCORBIC ACID) 500 MG tablet Take 500 mg by mouth 2 (two) times daily.    . [DISCONTINUED] fluticasone (FLONASE) 50 MCG/ACT nasal spray Place 1 spray into the nose daily. 16 g 2  . [DISCONTINUED] pravastatin (PRAVACHOL) 40 MG tablet Take 1 tablet (40 mg total) by mouth every evening. 90 tablet 3   No current facility-administered medications for this visit.    Allergies:   Adderall; Bee venom; Fluorescein; Lyrica; and Chantix    Social History:  The patient   reports that she quit smoking today. Her smoking use included Cigarettes. She has a 30 pack-year smoking history. She has never used smokeless tobacco. She reports that she drinks alcohol. She reports that she does not use illicit drugs.   Family History:  The patient's family history includes ADD / ADHD in her brother; CAD in her father; COPD in her mother;  Crohn's disease in her daughter; Heart attack (age of onset: 65) in her paternal grandfather; Stroke in her other; Vascular Disease in her mother.    ROS:  General:no colds or fevers, + weight decrease Skin:no rashes or ulcers HEENT:no blurred vision, no congestion CV:see HPI PUL:see HPI GI:no diarrhea constipation or melena, no indigestion GU:no hematuria, no dysuria MS:no joint pain, no claudication Neuro:no syncope, no lightheadedness Endo:no diabetes, no thyroid disease  Wt Readings from Last 3 Encounters:  02/24/15 147 lb (66.679 kg)  02/17/15 151 lb 14.4 oz (68.9 kg)  11/02/14 158 lb (71.668 kg)     PHYSICAL EXAM: VS:  BP 110/75 mmHg  Pulse 67  Ht 5\' 3"  (1.6 m)  Wt 147 lb (66.679 kg)  BMI 26.05 kg/m2 , BMI Body mass index is 26.05 kg/(m^2). General:Pleasant affect, NAD Skin:Warm and dry, brisk capillary refill, 2 small dark areas on leg she will monitor and review with her PCP HEENT:normocephalic, sclera clear, mucus membranes moist Neck:supple, no JVD, no bruits  Heart:S1S2 RRR with soft systolic murmur, no gallup, rub or click Lungs:clear without rales, rhonchi, occ wheeze IOE:VOJJ, non tender, + BS, do not palpate liver spleen or masses Ext:no lower ext edema, 2+ pedal pulses, 2+ radial pulses Neuro:alert and oriented X 3, MAE, follows commands, + facial symmetry    EKG:  EKG is ordered today. The ekg ordered today demonstrates SR LAD and RBBB loss of R wave in V4-6 but otherwise without change.     Recent Labs: 02/11/2015: B Natriuretic Peptide 73.5 02/17/2015: BUN 9; Creatinine, Ser 0.97; Hemoglobin 10.1*;  Magnesium 2.0; Platelets 239; Potassium 4.6; Sodium 138    Lipid Panel    Component Value Date/Time   CHOL 149 02/11/2015 0611   TRIG 171* 02/11/2015 0611   TRIG 466* 05/26/2006 0916   HDL 39* 02/11/2015 0611   CHOLHDL 3.8 02/11/2015 0611   CHOLHDL 5.3 CALC 05/26/2006 0916   VLDL 34 02/11/2015 0611   LDLCALC 76 02/11/2015 0611   LDLDIRECT 123.4 01/07/2014 1204   LDLDIRECT 94.1 05/26/2006 0916       Other studies Reviewed: Additional studies/ records that were reviewed today include: both cardiac caths and echo . ECHO: Study Conclusions  - Left ventricle: The cavity size was normal. Wall thickness was normal. Systolic function was normal. The estimated ejection fraction was in the range of 60% to 65%. Features are consistent with a pseudonormal left ventricular filling pattern, with concomitant abnormal relaxation and increased filling pressure (grade 2 diastolic dysfunction). Doppler parameters are consistent with high ventricular filling pressure. Ratio of mitral valve peak E velocity to medial annulus E (e&') velocity: 19.4. - Regional wall motion abnormality: Mild hypokinesis of the mid inferoseptal and mid inferior myocardium. - Aortic valve: There was mild regurgitation. - Mitral valve: There was mild regurgitation. - Left atrium: The atrium was mildly dilated. - Right ventricle: Systolic function was mildly reduced. - Tricuspid valve: There was moderate regurgitation.   ASSESSMENT AND PLAN:  1.Inferior STEMI/CAD  - initial cath 02/11/15 revealed 80% mid LAD, 70% D1, chronically occluded mid LCX, 50% mid RCA, 70% distal RCA, acute occlusion of prox RCA s/p DES to prox RCA that day- emergently - back to cath lab 02/16/15 -> s/p DES to mLAD, DES to distal RCA. Med rx for chronically occluded mLCx.  Will have her follow up in 3 weeks with APP and 1st available with Dr. Tamala Julian. She will do cardiac rehab then completed her Back PT. Continue current  meds.  2.  Cardiogenic shock with ? RV infarct, improved, requiring pressors/IABP 3.Hyperglycemia, likely due to stress of MI, A1C 5.3 4.H/o HTN- stable today 5.Dyslipidemia- on statin, recheck hepatic and lipids in 4 weeks 6. NSVT (6 beats 02/13/15) in setting of hypokalemia, hypomagnesemia recheck BMP and mg+ level.  7. Tobacco abuse- has stopped on nicoderm patch 8. Normocytic anemia- recheck cbc 9. Mild AI/MR, mod TR by echo 02/12/15- DOE related to recovery, though if recurrent episodes she will call.   Current medicines are reviewed with the patient today.  The patient Has no concerns regarding medicines.  The following changes have been made:  See above Labs/ tests ordered today include:see above  Disposition:   FU:  see above  Lennie Muckle, NP  02/24/2015 12:25 PM    [facility] [place], [place], [place], [place] Phone: [phone]; Fax: [phone]

## 2015-03-06 ENCOUNTER — Telehealth: Payer: Self-pay

## 2015-03-06 DIAGNOSIS — D72829 Elevated white blood cell count, unspecified: Secondary | ICD-10-CM

## 2015-03-06 NOTE — Telephone Encounter (Signed)
Called patient about lab results. Per Cecilie Kicks NP, Check u/a with C&S, her white count was elevated, check 2 view CXR and Increase fluid intake. Patient verbalized understanding. Orders will be put in and appointment made for lab. Patient also had questions about starting cardiac rehab. Filled out form, sent to Dr. Tamala Julian to sign and will be fax to cardiac rehab after that.

## 2015-03-07 ENCOUNTER — Other Ambulatory Visit (INDEPENDENT_AMBULATORY_CARE_PROVIDER_SITE_OTHER): Payer: BLUE CROSS/BLUE SHIELD | Admitting: *Deleted

## 2015-03-07 ENCOUNTER — Ambulatory Visit
Admission: RE | Admit: 2015-03-07 | Discharge: 2015-03-07 | Disposition: A | Discharge status: Home / Self Care | Payer: BLUE CROSS/BLUE SHIELD | Source: Ambulatory Visit | Attending: Cardiology | Admitting: Cardiology

## 2015-03-07 ENCOUNTER — Other Ambulatory Visit: Payer: Self-pay | Admitting: Internal Medicine

## 2015-03-07 DIAGNOSIS — D72829 Elevated white blood cell count, unspecified: Secondary | ICD-10-CM

## 2015-03-09 ENCOUNTER — Telehealth: Payer: Self-pay | Admitting: Interventional Cardiology

## 2015-03-09 LAB — URINE CULTURE
COLONY COUNT: NO GROWTH
Organism ID, Bacteria: NO GROWTH

## 2015-03-09 NOTE — Telephone Encounter (Signed)
Pt rtn call-didn't get a name as to who called-pt did have a UA done-pls advise [phone]

## 2015-03-09 NOTE — Telephone Encounter (Signed)
Returned pt call. Pt sts that she was returning Pam Pate,RN's call. Pt given cxr results. Pt's CXR is stable. No heart failure. No pneumonia. Adv pt that UA culture results are not resulted. She will be call with results when available. Pt verbalized understanding

## 2015-03-13 ENCOUNTER — Telehealth: Payer: Self-pay

## 2015-03-13 NOTE — Telephone Encounter (Signed)
Opened up an error

## 2015-03-16 ENCOUNTER — Encounter (HOSPITAL_COMMUNITY): Payer: Self-pay | Admitting: Emergency Medicine

## 2015-03-16 ENCOUNTER — Emergency Department (HOSPITAL_COMMUNITY): Payer: BLUE CROSS/BLUE SHIELD

## 2015-03-16 ENCOUNTER — Emergency Department (HOSPITAL_COMMUNITY)
Admission: EM | Admit: 2015-03-16 | Discharge: 2015-03-16 | Disposition: A | Discharge status: Home / Self Care | Payer: BLUE CROSS/BLUE SHIELD | Attending: Emergency Medicine | Admitting: Emergency Medicine

## 2015-03-16 DIAGNOSIS — Z79899 Other long term (current) drug therapy: Secondary | ICD-10-CM | POA: Insufficient documentation

## 2015-03-16 DIAGNOSIS — R1032 Left lower quadrant pain: Secondary | ICD-10-CM | POA: Diagnosis not present

## 2015-03-16 DIAGNOSIS — I2510 Atherosclerotic heart disease of native coronary artery without angina pectoris: Secondary | ICD-10-CM | POA: Insufficient documentation

## 2015-03-16 DIAGNOSIS — Z85828 Personal history of other malignant neoplasm of skin: Secondary | ICD-10-CM | POA: Diagnosis not present

## 2015-03-16 DIAGNOSIS — K219 Gastro-esophageal reflux disease without esophagitis: Secondary | ICD-10-CM | POA: Insufficient documentation

## 2015-03-16 DIAGNOSIS — I10 Essential (primary) hypertension: Secondary | ICD-10-CM | POA: Diagnosis not present

## 2015-03-16 DIAGNOSIS — M797 Fibromyalgia: Secondary | ICD-10-CM | POA: Insufficient documentation

## 2015-03-16 DIAGNOSIS — R51 Headache: Secondary | ICD-10-CM | POA: Insufficient documentation

## 2015-03-16 DIAGNOSIS — E876 Hypokalemia: Secondary | ICD-10-CM | POA: Insufficient documentation

## 2015-03-16 DIAGNOSIS — Z720 Tobacco use: Secondary | ICD-10-CM | POA: Insufficient documentation

## 2015-03-16 DIAGNOSIS — F909 Attention-deficit hyperactivity disorder, unspecified type: Secondary | ICD-10-CM | POA: Insufficient documentation

## 2015-03-16 DIAGNOSIS — Z7982 Long term (current) use of aspirin: Secondary | ICD-10-CM | POA: Insufficient documentation

## 2015-03-16 DIAGNOSIS — I2119 ST elevation (STEMI) myocardial infarction involving other coronary artery of inferior wall: Secondary | ICD-10-CM | POA: Diagnosis not present

## 2015-03-16 DIAGNOSIS — R0602 Shortness of breath: Secondary | ICD-10-CM | POA: Diagnosis not present

## 2015-03-16 DIAGNOSIS — R079 Chest pain, unspecified: Secondary | ICD-10-CM | POA: Diagnosis not present

## 2015-03-16 DIAGNOSIS — F419 Anxiety disorder, unspecified: Secondary | ICD-10-CM | POA: Diagnosis not present

## 2015-03-16 DIAGNOSIS — R1031 Right lower quadrant pain: Secondary | ICD-10-CM | POA: Insufficient documentation

## 2015-03-16 LAB — COMPREHENSIVE METABOLIC PANEL
ALBUMIN: 3.3 g/dL — AB (ref 3.5–5.0)
ALK PHOS: 124 U/L (ref 38–126)
ALT: 18 U/L (ref 14–54)
AST: 22 U/L (ref 15–41)
Anion gap: 9 (ref 5–15)
BILIRUBIN TOTAL: 0.3 mg/dL (ref 0.3–1.2)
BUN: 11 mg/dL (ref 6–20)
CALCIUM: 9.8 mg/dL (ref 8.9–10.3)
CO2: 24 mmol/L (ref 22–32)
Chloride: 103 mmol/L (ref 101–111)
Creatinine, Ser: 1.01 mg/dL — ABNORMAL HIGH (ref 0.44–1.00)
GFR calc Af Amer: >60 mL/min (ref >60)
GFR, EST NON AFRICAN AMERICAN: 59 mL/min — AB (ref >60)
GLUCOSE: 104 mg/dL — AB (ref 65–99)
Potassium: 4.4 mmol/L (ref 3.5–5.1)
Sodium: 136 mmol/L (ref 135–145)
TOTAL PROTEIN: 6.2 g/dL — AB (ref 6.5–8.1)

## 2015-03-16 LAB — CBC WITH DIFFERENTIAL/PLATELET
BASOS ABS: 0 10*3/uL (ref 0.0–0.1)
BASOS PCT: 0 %
Eosinophils Absolute: 0.8 10*3/uL — ABNORMAL HIGH (ref 0.0–0.7)
Eosinophils Relative: 7 %
HEMATOCRIT: 36.8 % (ref 36.0–46.0)
HEMOGLOBIN: 12 g/dL (ref 12.0–15.0)
LYMPHS PCT: 21 %
Lymphs Abs: 2.5 10*3/uL (ref 0.7–4.0)
MCH: 27.6 pg (ref 26.0–34.0)
MCHC: 32.6 g/dL (ref 30.0–36.0)
MCV: 84.6 fL (ref 78.0–100.0)
MONO ABS: 0.8 10*3/uL (ref 0.1–1.0)
MONOS PCT: 7 %
NEUTROS ABS: 7.6 10*3/uL (ref 1.7–7.7)
Neutrophils Relative %: 65 %
Platelets: 237 10*3/uL (ref 150–400)
RBC: 4.35 MIL/uL (ref 3.87–5.11)
RDW: 17.3 % — ABNORMAL HIGH (ref 11.5–15.5)
WBC: 11.7 10*3/uL — AB (ref 4.0–10.5)

## 2015-03-16 LAB — MAGNESIUM: Magnesium: 1.9 mg/dL (ref 1.7–2.4)

## 2015-03-16 LAB — BRAIN NATRIURETIC PEPTIDE: B Natriuretic Peptide: 443.7 pg/mL — ABNORMAL HIGH (ref 0.0–100.0)

## 2015-03-16 LAB — I-STAT TROPONIN, ED
TROPONIN I, POC: 0.04 ng/mL (ref 0.00–0.08)
TROPONIN I, POC: 0.03 ng/mL (ref 0.00–0.08)

## 2015-03-16 MED ORDER — ASPIRIN 81 MG PO CHEW
243.0000 mg | CHEWABLE_TABLET | Freq: Once | ORAL | Status: AC
  Administered 2015-03-16: 243 mg via ORAL
  Filled 2015-03-16: qty 3

## 2015-03-16 MED ORDER — ALBUTEROL SULFATE (2.5 MG/3ML) 0.083% IN NEBU
5.0000 mg | INHALATION_SOLUTION | Freq: Once | RESPIRATORY_TRACT | Status: AC
  Administered 2015-03-16: 5 mg via RESPIRATORY_TRACT
  Filled 2015-03-16: qty 6

## 2015-03-16 MED ORDER — FUROSEMIDE 10 MG/ML IJ SOLN
40.0000 mg | Freq: Once | INTRAMUSCULAR | Status: AC
  Administered 2015-03-16: 40 mg via INTRAVENOUS
  Filled 2015-03-16: qty 4

## 2015-03-16 MED ORDER — NITROGLYCERIN 2 % TD OINT
1.0000 [in_us] | TOPICAL_OINTMENT | Freq: Once | TRANSDERMAL | Status: AC
  Administered 2015-03-16: 1 [in_us] via TOPICAL
  Filled 2015-03-16: qty 1

## 2015-03-16 MED ORDER — MORPHINE SULFATE (PF) 2 MG/ML IV SOLN
2.0000 mg | Freq: Once | INTRAVENOUS | Status: AC
  Administered 2015-03-16: 2 mg via INTRAVENOUS
  Filled 2015-03-16: qty 1

## 2015-03-16 NOTE — Consult Note (Signed)
CARDIOLOGY CONSULT NOTE      Patient ID: Catherine Craig MRN: [id number] DOB/AGE: 61-Jul-1957 61 y.o.  Admit date: 03/16/2015 Referring PhysicianBlair Mingo Amber, MD Primary Gar Ponto, MD Primary Cardiologist Dr. Tamala Julian Reason for Consultation palpitations, shortness of breath  HPI: 59 year old who recently had an inferior MI and was found to have multivessel coronary artery disease. She had multivessel PCI about a month ago with Dr. Tamala Julian. She had been doing well overall. She still feels fatigued since leaving the hospital. Of note, she was quite ill. She had cardiogenic shock and a balloon pump placed.  Early this morning, she felt a bounding pulse 80 and in her neck and also had a headache. Given this constellation of symptoms, she came to the emergency room. On the way to the emergency room, she noticed some shortness of breath as well. Evaluation in the emergency room showed a moderately elevated BNP. Chest x-ray was clear. The patient still continues to smoke. She has never formally been diagnosed with COPD, but she does think she has it. She received an albuterol nebulizer and felt like her heart rate was going very fast. This has calmed down. She denies any pain in her chest. When she had the transient shortness of breath, she felt a tightness in her chest. This was actually different than what she felt at the time her heart attack. At that time, she felt jaw pain. Point-of-care troponin was negative. Repeat troponin is pending.  Of note, she has Lasix at home. She has not been using it for the last few weeks. She typically uses it more in hot weather. Ejection fraction by echocardiogram is noted be 60% but this was with a balloon pump in place.  Review of systems complete and found to be negative unless listed above   Past Medical History  Diagnosis Date  . ADHD   . Allergic rhinitis   . Basal cell carcinoma   . Fibromyalgia   . GERD   . Dyslipidemia   .  Essential hypertension   . MENOPAUSAL SYNDROME 12/15/2006  . Osteoarthritis   . Renal colic 8/50/2774  . VAGINITIS, ATROPHIC 01/05/2007  . Histoplasmosis with retinitis   . Anxiety   . IBS (irritable bowel syndrome)   . Anal fissure   . Thoracic outlet syndrome   . Lipoma     on chest  . CAD (coronary artery disease)     a. Inferior STEMI -  initial cath 02/11/15 revealed 80% mid LAD, 70% D1, chronically occluded mid LCX, 50% mid RCA, 70% distal RCA, acute occlusion of prox RCA s/p DES to prox RCA. Planned re-cath 02/16/15 s/p DES to mLAD, DES to distal RCA. Med rx for chronically occluded mLCx.  . Cardiogenic shock     a. 01/2015 in setting of inferior STEMI, possibly 2/2 cardiogenic shock.  . NSVT (nonsustained ventricular tachycardia)     a. 6 beats 01/2015 after STEMI in setting of low K, Mg.  . Tobacco abuse   . Normocytic anemia   . Valvular heart disease     a. Mild AI/MR, mod TR by echo 02/12/15.  Marland Kitchen Hypokalemia   . Hypomagnesemia     Family History  Problem Relation Age of Onset  . COPD Mother   . Vascular Disease Mother   . ADD / ADHD Brother   . CAD Father   . Crohn's disease Daughter   . Heart attack Paternal Grandfather 57  . Stroke Other     1st degree relative <60  Social History   Social History  . Marital Status: Married    Spouse Name: N/A  . Number of Children: N/A  . Years of Education: N/A   Occupational History  . Not on file.   Social History Main Topics  . Smoking status: Current Some Day Smoker -- 0.50 packs/day for 30 years    Types: Cigarettes    Last Attempt to Quit: 02/24/2015  . Smokeless tobacco: Never Used     Comment: using electronic cigarette as of 03/2012  . Alcohol Use: 0.0 oz/week    0 Standard drinks or equivalent per week     Comment: social  . Drug Use: No  . Sexual Activity: Not on file   Other Topics Concern  . Not on file   Social History Narrative    Past Surgical History  Procedure Laterality Date  . Appendectomy   1973  . Tonsillectomy  1980  . Dilation and curettage of uterus  1982  . Tonsillectomy    . Eye surgery      Lazer to right x 6  . Spine surgery  04/16/2012  . Cardiac catheterization N/A 02/11/2015    Procedure: Left Heart Cath and Coronary Angiography;  Surgeon: Belva Crome, MD;  Location: [place] [facility];  Service: Cardiovascular;  Laterality: N/A;  . Cardiac catheterization N/A 02/11/2015    Procedure: Coronary Stent Intervention;  Surgeon: Belva Crome, MD;  Location: [place] [facility];  Service: Cardiovascular;  Laterality: N/A;  . Cardiac catheterization N/A 02/16/2015    Procedure: Coronary Stent Intervention;  Surgeon: Peter M Martinique, MD;  Location: [place] [facility];  Service: Cardiovascular;  Laterality: N/A;      (Not in a hospital admission)  Physical Exam: Vitals:   Filed Vitals:   03/16/15 0801 03/16/15 0815 03/16/15 0900 03/16/15 0937  BP: 129/84 133/90 138/97 143/84  Pulse: 64 68 71 67  Temp:      TempSrc:      Resp:  15 16 16   Height:      Weight:      SpO2: 97% 94% 97% 98%   I&O's:  No intake or output data in the 24 hours ending 03/16/15 1050 Physical exam:  [place]/AT EOMI No JVD, No carotid bruit RRR S1S2  No wheezing Soft. NT, nondistended Trace ankle edema bilaterally. No focal motor or sensory deficits Normal affect Long scar on spine.  Labs:   Lab Results  Component Value Date   WBC 11.7* 03/16/2015   HGB 12.0 03/16/2015   HCT 36.8 03/16/2015   MCV 84.6 03/16/2015   PLT 237 03/16/2015    Recent Labs Lab 03/16/15 0648  NA 136  K 4.4  CL 103  CO2 24  BUN 11  CREATININE 1.01*  CALCIUM 9.8  PROT 6.2*  BILITOT 0.3  ALKPHOS 124  ALT 18  AST 22  GLUCOSE 104*   Lab Results  Component Value Date   TROPONINI 12.77* 02/13/2015    Lab Results  Component Value Date   CHOL 149 02/11/2015   CHOL 207* 01/07/2014   CHOL 190 11/28/2010   Lab Results  Component Value Date   HDL 39* 02/11/2015   HDL 38.20* 01/07/2014   HDL  46.60 11/28/2010   Lab Results  Component Value Date   LDLCALC 76 02/11/2015   Lab Results  Component Value Date   TRIG 171* 02/11/2015   TRIG 368.0* 01/07/2014   TRIG 339.0* 11/28/2010   Lab Results  Component Value  Date   CHOLHDL 3.8 02/11/2015   CHOLHDL 5 01/07/2014   CHOLHDL 4 11/28/2010   Lab Results  Component Value Date   LDLDIRECT 123.4 01/07/2014   LDLDIRECT 107.7 11/28/2010   LDLDIRECT 159.0 10/23/2009      Radiology: Clear chest x-ray EKG: Normal sinus rhythm, inferior T wave inversions  ASSESSMENT AND PLAN:  Active Problems:   * No active hospital problems. *  Shortness of breath: She is receiving a dose of Lasix here. Her breathing feels back to normal. Her BNP is mildly elevated. Her oxygen saturations are 100% on room air. She does not appear fluid overloaded at this time.  1) I've asked her to take Lasix 40 mg tablets daily until her appointment on Monday, October 3. At that time, she will need a BNP and BMet check. This appointment is already scheduled. 2) she should weigh herself daily. She'll try to stick to a low salt diet. 3) if the second point-of-care troponin is negative, she can be discharged home with the above follow-up. 4) she needs to stop smoking as well. Signed:   Mina Marble, MD, [facility] 03/16/2015, 10:50 AM

## 2015-03-16 NOTE — ED Notes (Addendum)
Pt c/o active chest pain after getting albuterol treatment. Pt states feels like squeezing pain and tightness in her chest and back rating currently 5/10. Abigail Butts, PA-C aware. NEw orders received.

## 2015-03-16 NOTE — ED Provider Notes (Signed)
CSN: [id number]     Arrival date & time 03/16/15  0550 History   First MD Initiated Contact with Patient 03/16/15 [phone]     Chief Complaint  Patient presents with  . Shortness of Breath  . Headache     (Consider location/radiation/quality/duration/timing/severity/associated sxs/prior Treatment) Patient is a 59 y.o. female presenting with shortness of breath and headaches. The history is provided by the patient and medical records. No language interpreter was used.  Shortness of Breath Associated symptoms: headaches   Associated symptoms: no abdominal pain, no chest pain, no cough, no diaphoresis, no fever, no rash, no vomiting and no wheezing   Headache Associated symptoms: no abdominal pain, no back pain, no cough, no diarrhea, no fatigue, no fever, no nausea, no neck stiffness and no vomiting      Catherine Craig is a 59 y.o. female  with a hx of fibromyalgia, STEMI (02/11/15) presents to the Emergency Department complaining of gradual, persistent, progressively worsening headache onset yesterday evening around 7pm. Pt reports the headache is bounding and throbbing; rated at a 3/10.  Pt denies vision changes.  She took an oxycodone with some relief.  Associated symptoms include general fatigue, anxiety with chest tightness, sinus congestion, occasional cough and SOB.  Pt reports that after the MI and stenting, she had SOB with exertion but tonight it was increased and present even with small movements which is usual.  She reports she has been taking her anticoagulant as directed.  Pt denies fever, chills, neck pain, neck stiffness, chest pain, abd pain, nausea, vomiting, diarrhea, syncope, dysuria.  Pt reports she continues to smoke 10 cigarettes per day.      Past Medical History  Diagnosis Date  . ADHD   . Allergic rhinitis   . Basal cell carcinoma   . Fibromyalgia   . GERD   . Dyslipidemia   . Essential hypertension   . MENOPAUSAL SYNDROME 12/15/2006  . Osteoarthritis   . Renal  colic 2/50/5397  . VAGINITIS, ATROPHIC 01/05/2007  . Histoplasmosis with retinitis   . Anxiety   . IBS (irritable bowel syndrome)   . Anal fissure   . Thoracic outlet syndrome   . Lipoma     on chest  . CAD (coronary artery disease)     a. Inferior STEMI -  initial cath 02/11/15 revealed 80% mid LAD, 70% D1, chronically occluded mid LCX, 50% mid RCA, 70% distal RCA, acute occlusion of prox RCA s/p DES to prox RCA. Planned re-cath 02/16/15 s/p DES to mLAD, DES to distal RCA. Med rx for chronically occluded mLCx.  . Cardiogenic shock     a. 01/2015 in setting of inferior STEMI, possibly 2/2 cardiogenic shock.  . NSVT (nonsustained ventricular tachycardia)     a. 6 beats 01/2015 after STEMI in setting of low K, Mg.  . Tobacco abuse   . Normocytic anemia   . Valvular heart disease     a. Mild AI/MR, mod TR by echo 02/12/15.  Marland Kitchen Hypokalemia   . Hypomagnesemia    Past Surgical History  Procedure Laterality Date  . Appendectomy  1973  . Tonsillectomy  1980  . Dilation and curettage of uterus  1982  . Tonsillectomy    . Eye surgery      Lazer to right x 6  . Spine surgery  04/16/2012  . Cardiac catheterization N/A 02/11/2015    Procedure: Left Heart Cath and Coronary Angiography;  Surgeon: Belva Crome, MD;  Location: [facility];  Service: Cardiovascular;  Laterality: N/A;  . Cardiac catheterization N/A 02/11/2015    Procedure: Coronary Stent Intervention;  Surgeon: Belva Crome, MD;  Location: [facility];  Service: Cardiovascular;  Laterality: N/A;  . Cardiac catheterization N/A 02/16/2015    Procedure: Coronary Stent Intervention;  Surgeon: Peter M Martinique, MD;  Location: [facility];  Service: Cardiovascular;  Laterality: N/A;   Family History  Problem Relation Age of Onset  . COPD Mother   . Vascular Disease Mother   . ADD / ADHD Brother   . CAD Father   . Crohn's disease Daughter   . Heart attack Paternal Grandfather 3  . Stroke Other     1st degree relative  <60   Social History  Substance Use Topics  . Smoking status: Current Some Day Smoker -- 0.50 packs/day for 30 years    Types: Cigarettes    Last Attempt to Quit: 02/24/2015  . Smokeless tobacco: Never Used     Comment: using electronic cigarette as of 03/2012  . Alcohol Use: 0.0 oz/week    0 Standard drinks or equivalent per week     Comment: social   OB History    No data available     Review of Systems  Constitutional: Negative for fever, diaphoresis, appetite change, fatigue and unexpected weight change.  HENT: Negative for mouth sores.   Eyes: Negative for visual disturbance.  Respiratory: Positive for chest tightness and shortness of breath. Negative for cough and wheezing.   Cardiovascular: Negative for chest pain.  Gastrointestinal: Negative for nausea, vomiting, abdominal pain, diarrhea and constipation.  Endocrine: Negative for polydipsia, polyphagia and polyuria.  Genitourinary: Negative for dysuria, urgency, frequency and hematuria.  Musculoskeletal: Negative for back pain and neck stiffness.  Skin: Negative for rash.  Allergic/Immunologic: Negative for immunocompromised state.  Neurological: Positive for headaches. Negative for syncope and light-headedness.  Hematological: Does not bruise/bleed easily.  Psychiatric/Behavioral: Negative for sleep disturbance. The patient is not nervous/anxious.       Allergies  Adderall; Bee venom; Fluorescein; Lyrica; and Chantix  Home Medications   Prior to Admission medications   Medication Sig Start Date End Date Taking? Authorizing Provider  aspirin EC 81 MG tablet Take 1 tablet (81 mg total) by mouth daily. 02/17/15  Yes Dayna N Dunn, PA-C  b complex vitamins tablet Take 1 tablet by mouth at bedtime.   Yes Historical Provider, MD  B Complex-Folic Acid (SUPER B COMPLEX MAXI PO) Take 1 capsule by mouth daily.   Yes Historical Provider, MD  Calcium Citrate-Vitamin D (CITRACAL + D PO) Take 1 tablet by mouth 2 (two) times  daily.   Yes Historical Provider, MD  Coenzyme Q10 (COQ-10) 200 MG CAPS Take 1 tablet by mouth 2 (two) times daily.   Yes Historical Provider, MD  [employer] SENNA PLUS 8.6-50 MG per tablet Take 1 tablet by mouth daily. 12/05/14  Yes Historical Provider, MD  cyclobenzaprine (FLEXERIL) 10 MG tablet TAKE 1 TABLET BY MOUTH 3 TIMES A DAY AS NEEDED FOR MUSCLE SPASM Patient taking differently: TAKE 10 mg three (3) times a day and 20 mg at night FOR MUSCLE SPASM 10/20/14  Yes Marletta Lor, MD  gabapentin (NEURONTIN) 400 MG capsule Take 400 mg by mouth at bedtime as needed (nerve pain).    Yes Historical Provider, MD  magnesium oxide (MAG-OX) 400 (241.3 MG) MG tablet Take 1 tablet (400 mg total) by mouth daily. 02/17/15  Yes Dayna N Dunn, PA-C  metoprolol tartrate (  LOPRESSOR) 50 MG tablet Take 1 tablet (50 mg total) by mouth 2 (two) times daily. 02/17/15  Yes Dayna N Dunn, PA-C  Multiple Vitamin (MULTIVITAMIN WITH MINERALS) TABS Take 0.5 tablets by mouth 2 (two) times daily.   Yes Historical Provider, MD  Multiple Vitamin (MULTIVITAMIN) tablet Take 1 tablet by mouth daily.   Yes Historical Provider, MD  nitroGLYCERIN (NITROSTAT) 0.4 MG SL tablet Place 1 tablet (0.4 mg total) under the tongue every 5 (five) minutes as needed for chest pain (up to 3 doses). 02/17/15  Yes Dayna N Dunn, PA-C  oxyCODONE-acetaminophen (PERCOCET) 10-325 MG tablet Take 1-2 tablets by mouth every 6 (six) hours as needed. pain 03/07/15  Yes Historical Provider, MD  potassium chloride SA (K-DUR,KLOR-CON) 20 MEQ tablet Take 2 tablets (40 mEq total) by mouth daily. 02/17/15  Yes Dayna N Dunn, PA-C  rosuvastatin (CRESTOR) 40 MG tablet Take 1 tablet (40 mg total) by mouth every evening. 02/17/15  Yes Dayna N Dunn, PA-C  sodium chloride (OCEAN) 0.65 % SOLN nasal spray Place 1 spray into both nostrils daily as needed for congestion.   Yes Historical Provider, MD  ticagrelor (BRILINTA) 90 MG TABS tablet Take 1 tablet (90 mg total) by mouth 2 (two) times  daily. 02/17/15  Yes Dayna N Dunn, PA-C  traMADol (ULTRAM) 50 MG tablet Take 50 mg by mouth every 6 (six) hours as needed (pain).   Yes Historical Provider, MD  vitamin C (ASCORBIC ACID) 500 MG tablet Take 500 mg by mouth 2 (two) times daily.   Yes Historical Provider, MD   BP 143/84 mmHg  Pulse 67  Temp(Src) 98.6 F (37 C) (Oral)  Resp 16  Ht 5\' 4"  (1.626 m)  Wt 160 lb (72.576 kg)  BMI 27.45 kg/m2  SpO2 98% Physical Exam  Constitutional: She appears well-developed and well-nourished. No distress.  Awake, alert, nontoxic appearance  HENT:  Head: Normocephalic and atraumatic.  Mouth/Throat: Oropharynx is clear and moist. No oropharyngeal exudate.  Eyes: Conjunctivae are normal. No scleral icterus.  Neck: Normal range of motion. Neck supple.  Cardiovascular: Normal rate, regular rhythm, normal heart sounds and intact distal pulses.   No murmur heard. Pulmonary/Chest: Effort normal. No respiratory distress. She has no wheezes. She has rales in the right lower field and the left lower field.  Equal chest expansion Mild rales in the bases with fine expiratory wheeze  Abdominal: Soft. Bowel sounds are normal. She exhibits no mass. There is no tenderness. There is no rebound and no guarding.  Soft and nontender  Musculoskeletal: Normal range of motion. She exhibits no edema.  No peripheral edema  Neurological: She is alert.  Speech is clear and goal oriented Moves extremities without ataxia  Skin: Skin is warm and dry. No rash noted. She is not diaphoretic.  Psychiatric: She has a normal mood and affect.  Nursing note and vitals reviewed.   ED Course  Procedures (including critical care time) Labs Review Labs Reviewed  CBC WITH DIFFERENTIAL/PLATELET - Abnormal; Notable for the following:    WBC 11.7 (*)    RDW 17.3 (*)    Eosinophils Absolute 0.8 (*)    All other components within normal limits  COMPREHENSIVE METABOLIC PANEL - Abnormal; Notable for the following:    Glucose,  Bld 104 (*)    Creatinine, Ser 1.01 (*)    Total Protein 6.2 (*)    Albumin 3.3 (*)    GFR calc non Af Amer 59 (*)    All other components  within normal limits  BRAIN NATRIURETIC PEPTIDE - Abnormal; Notable for the following:    B Natriuretic Peptide 443.7 (*)    All other components within normal limits  MAGNESIUM  I-STAT TROPOININ, ED  Randolm Idol, ED    Imaging Review Dg Chest 2 View  03/16/2015   CLINICAL DATA:  Shortness breath and wheezing last night. Initial encounter.  EXAM: CHEST  2 VIEW  COMPARISON:  PA and lateral chest 03/07/2015 and 10/28/2011.  FINDINGS: The lungs are clear. Heart size is normal. No pneumothorax or pleural effusion. Extensive spinal fusion hardware is again seen.  IMPRESSION: No acute disease.  Stable compared to prior exam.   Electronically Signed   By: Inge Rise M.D.   On: 03/16/2015 08:01   I have personally reviewed and evaluated these images and lab results as part of my medical decision-making.   EKG Interpretation   Date/Time:  Thursday March 16 2015 05:59:05 EDT Ventricular Rate:  70 PR Interval:  131 QRS Duration: 132 QT Interval:  423 QTC Calculation: 456 R Axis:   -58 Text Interpretation:  Sinus rhythm Right bundle branch block Inferior  infarct, age indeterminate No significant change from 02/17/2015 Confirmed  by DELO  MD, [place] ([id number]) on 03/16/2015 6:13:57 AM      EKG Interpretation  Date/Time:  Thursday March 16 2015 07:25:36 EDT Ventricular Rate:  69 PR Interval:  127 QRS Duration: 143 QT Interval:  436 QTC Calculation: 467 R Axis:   -59 Text Interpretation:  Sinus rhythm Right bundle branch block Inferior infarct, age indeterminate Lateral leads are also involved No significant change since last tracing Confirmed by Mingo Amber  MD, [place] [phone]) on 03/16/2015 7:35:08 AM        MDM   Final diagnoses:  Chest pain, unspecified chest pain type  SOB (shortness of breath)  Tobacco abuse   Anouk Critzer  presents with dyspnea on exertion and mild headache approx 1 month after STEMI with cardiac stent placement.    7:21 AM Pt now complaining of active chest pain.  Will give nitro and repeat ECG.    8:37 AM  EKG is without ischemic changes. Patient given nitroglycerin and morphine with improvement of chest pain to 2/10. Patient with rales in the bases and exam clinically consistent with new onset CHF. Slightly elevated BNP 443.  Mild leukocytosis. Negative troponin this time.  Chest x-ray is without evidence of pulmonary edema, pneumothorax or pneumonia. Will consult cardiology for admission.  Pt is afebrile and there is no evidence of pericarditis on ECG.  Doubt Dressler syndrome.    8:46 AM Discussed with Roque Cash of cardiology who will evaluate for admission.    BP 133/90 mmHg  Pulse 68  Temp(Src) 98.6 F (37 C) (Oral)  Resp 15  Ht 5\' 4"  (1.626 m)  Wt 160 lb (72.576 kg)  BMI 27.45 kg/m2  SpO2 94%  The patient was discussed with and seen by Dr. Mingo Amber who agrees with the treatment plan.  11:07 AM Pt evaluated by Dr. Irish Lack who recommends that pt receive a delta trop in the ED and if neg discharge home with instructions to take her home lasix.    12:16 PM Delta trop negative.  Pt feels much better at this time and pt is CP free.  She is to follow-up with cardiology within 1 week and return to the ED for worsening symptoms.  BP 143/84 mmHg  Pulse 67  Temp(Src) 98.6 F (37 C) (Oral)  Resp 16  Ht  5\' 4"  (1.626 m)  Wt 160 lb (72.576 kg)  BMI 27.45 kg/m2  SpO2 98%     Abigail Butts, PA-C 03/16/15 [place], MD 03/16/15 [id number]

## 2015-03-16 NOTE — Discharge Instructions (Signed)
1. Medications: usual home medications including your Lasix as directed 2. Treatment: rest, drink plenty of fluids, decrease salt intake 3. Follow Up: Please followup with your primary doctor in 2 days and your cardiologist in 1 week for discussion of your diagnoses and further evaluation after today's visit; Please return to the ER for increased SOB, development of chest pain or other concerns

## 2015-03-16 NOTE — ED Notes (Signed)
Pt arrives from home reporting SOB, bounding  Headache, dizzy and lightheaded.  Pt reports "just not feeling right."  Pt reports SOB better now, still has "bounding headache".  Resp e/u, pt denies CP.

## 2015-03-16 NOTE — ED Notes (Signed)
Pt was able to walk to bathroom.

## 2015-03-17 ENCOUNTER — Telehealth: Payer: Self-pay | Admitting: *Deleted

## 2015-03-17 NOTE — Telephone Encounter (Signed)
Returned pt call. Adv her that a message has been fwd to [place]. Pt sts she has about 1-2 days pf Brilinta. Adv pt that we could supply her with some Brilinta samples and patient assistance form to be completed and returned to our office. Adv pt that I will leave samples and the form at the front desk for pick. Pt voiced appreciation,Pt sts that she will come to the office this afternoon to pick them up.

## 2015-03-17 NOTE — Telephone Encounter (Signed)
We should give as many samples as possible until we file for hardship.

## 2015-03-17 NOTE — Telephone Encounter (Signed)
Patient left a voicemail on the refill line stating that the brilinta is $230.00/month. She is unable to afford this and would like to be switched to something more affordable. She wanted to know if she could take plavix. Please advise. Thanks, MI

## 2015-03-19 ENCOUNTER — Encounter: Payer: Self-pay | Admitting: Physician Assistant

## 2015-03-19 DIAGNOSIS — D72829 Elevated white blood cell count, unspecified: Secondary | ICD-10-CM | POA: Insufficient documentation

## 2015-03-19 DIAGNOSIS — N179 Acute kidney failure, unspecified: Secondary | ICD-10-CM | POA: Insufficient documentation

## 2015-03-19 DIAGNOSIS — I5032 Chronic diastolic (congestive) heart failure: Secondary | ICD-10-CM | POA: Insufficient documentation

## 2015-03-19 NOTE — Progress Notes (Addendum)
Cardiology Office Note Date:  03/20/2015  Patient ID:  Catherine Craig, Catherine Craig 02-16-1956, MRN [id number] PCP:  Nyoka Cowden, MD  Cardiologist:  Dr. Tamala Julian  Chief Complaint: f/u ER visit for SOB, probable CHF  History of Present Illness: Catherine Craig is a 59 y.o. female with history of CAD (inferior STEMI 02/2015 s/p DES to prox RCA with staged DES to mLAD & DES to distal RCA, med rx for chronically occluded mLCx), HTN, RBBB, dyslipidemia, tobacco abuse, normocytic anemia, chronic back pain, mild AI/MR and mod TR by echo 01/2015 who presents for follow-up. She was admitted 8/27-9/2 with inferior STEMI with PCI details as above. Hospital course was notable for cardiogenic shock with ? RV infarct, improved, requiring pressors/IABP, hyperglycemia likely 2/2 stress of MI (A1C 5.3), NSVT (6 beats in setting of hypokalemia, hypomagnesemia). F/u echo 02/12/15: EF 60-65%, grade 2 DD, high ventricular filling pressures, mild HK of mid inferoseptal and mid inferior myocardium, mild AI, mild MR, mildly dilated La, mildly reduced RV systolic function, mod TR. She was seen in f/u by Cecilie Kicks NP 02/24/15 at which time she was doing well. She had initially reported some dyspnea and weakness after discharge that seemed to be improving. F/u labs revealed mild renal insufficiency (BUN Cr 29/1.25), Mg 2.5, WBC 16.4 and plt 402 with neg UCx and CXR with mild bronchitic changes. She was seen in the ER 03/16/15 complaining of feeling a bounding pulse in her neck, headache, and dyspnea. She received a neb and felt her heart was going fast. Symptoms were felt different than her prior MI. She received a dose of IV Lasix and her breathing felt back to normal. Labs were notable for WBC 11.7, Cr 1.01, Hgb norma at 12.0, glucose 104, BNP 443, troponin negative - suspect presentation c/w acute diastolic CHF. She was instructed to take Lasix 40mg  daily until follow-up. ADDENDUM 03/21/15: the patient called the office back to  clarify the dose of Lasix she had been taking. She reports she had been taking 20mg  daily, not 40mg  daily.  She comes in today for follow-up feeling better. No longer waking up as much as before with dyspnea. LEE has resolved. No further chest pain. She is awaiting call back from cardiac rehab. She is also in the process of applying for assistance for Brilinta. She is still working on cutting out smoking using nicotine patches and occasional lozenge.   Past Medical History  Diagnosis Date  . ADHD   . Allergic rhinitis   . Basal cell carcinoma   . Fibromyalgia   . GERD   . Dyslipidemia   . Essential hypertension   . MENOPAUSAL SYNDROME 12/15/2006  . Osteoarthritis   . Renal colic 2/29/7989  . VAGINITIS, ATROPHIC 01/05/2007  . Histoplasmosis with retinitis   . Anxiety   . IBS (irritable bowel syndrome)   . Anal fissure   . Thoracic outlet syndrome   . Lipoma     on chest  . CAD (coronary artery disease)     a. Inferior STEMI -  initial cath 02/11/15 revealed 80% mid LAD, 70% D1, chronically occluded mid LCX, 50% mid RCA, 70% distal RCA, acute occlusion of prox RCA s/p DES to prox RCA. Planned re-cath 02/16/15 s/p DES to mLAD, DES to distal RCA. Med rx for chronically occluded mLCx.  . Cardiogenic shock ([place])     a. 01/2015 in setting of inferior STEMI, possibly 2/2 cardiogenic shock.  . NSVT (nonsustained ventricular tachycardia) ([place])     a. 6  beats 01/2015 after STEMI in setting of low K, Mg.  . Tobacco abuse   . Normocytic anemia   . Valvular heart disease     a. Mild AI/MR, mod TR by echo 02/12/15.  Marland Kitchen Hypokalemia   . Hypomagnesemia   . Leukocytosis   . Acute kidney injury ([place])   . Chronic diastolic CHF (congestive heart failure) ([place])   . RBBB     Past Surgical History  Procedure Laterality Date  . Appendectomy  1973  . Tonsillectomy  1980  . Dilation and curettage of uterus  1982  . Tonsillectomy    . Eye surgery      Lazer to right x 6  . Spine surgery  04/16/2012  .  Cardiac catheterization N/A 02/11/2015    Procedure: Left Heart Cath and Coronary Angiography;  Surgeon: Belva Crome, MD;  Location: [facility];  Service: Cardiovascular;  Laterality: N/A;  . Cardiac catheterization N/A 02/11/2015    Procedure: Coronary Stent Intervention;  Surgeon: Belva Crome, MD;  Location: [facility];  Service: Cardiovascular;  Laterality: N/A;  . Cardiac catheterization N/A 02/16/2015    Procedure: Coronary Stent Intervention;  Surgeon: Peter M Martinique, MD;  Location: [facility];  Service: Cardiovascular;  Laterality: N/A;    Current Outpatient Prescriptions  Medication Sig Dispense Refill  . aspirin EC 81 MG tablet Take 1 tablet (81 mg total) by mouth daily. 30 tablet 11  . B Complex-Folic Acid (SUPER B COMPLEX MAXI PO) Take 1 capsule by mouth daily.    . Calcium Citrate-Vitamin D (CITRACAL + D PO) Take 1 tablet by mouth 2 (two) times daily.    . Coenzyme Q10 (COQ-10) 200 MG CAPS Take 1 tablet by mouth 2 (two) times daily.    . cyclobenzaprine (FLEXERIL) 10 MG tablet Take 10 mg by mouth 3 (three) times daily as needed for muscle spasms (20 mg at night).    . furosemide (LASIX) 40 MG tablet Take 40 mg by mouth daily.    Marland Kitchen gabapentin (NEURONTIN) 400 MG capsule Take 400 mg by mouth at bedtime as needed (nerve pain).     . magnesium oxide (MAG-OX) 400 (241.3 MG) MG tablet Take 1 tablet (400 mg total) by mouth daily. 30 tablet 6  . metoprolol tartrate (LOPRESSOR) 50 MG tablet Take 1 tablet (50 mg total) by mouth 2 (two) times daily. 60 tablet 6  . Multiple Vitamin (MULTIVITAMIN WITH MINERALS) TABS Take 0.5 tablets by mouth 2 (two) times daily.    . nitroGLYCERIN (NITROSTAT) 0.4 MG SL tablet Place 1 tablet (0.4 mg total) under the tongue every 5 (five) minutes as needed for chest pain (up to 3 doses). 25 tablet 3  . oxyCODONE-acetaminophen (PERCOCET) 10-325 MG tablet Take 1-2 tablets by mouth every 6 (six) hours as needed. pain    . potassium chloride SA  (K-DUR,KLOR-CON) 20 MEQ tablet Take 2 tablets (40 mEq total) by mouth daily. 60 tablet 6  . rosuvastatin (CRESTOR) 40 MG tablet Take 1 tablet (40 mg total) by mouth every evening. 30 tablet 6  . sodium chloride (OCEAN) 0.65 % SOLN nasal spray Place 1 spray into both nostrils daily as needed for congestion.    . ticagrelor (BRILINTA) 90 MG TABS tablet Take 1 tablet (90 mg total) by mouth 2 (two) times daily. 60 tablet 11  . traMADol (ULTRAM) 50 MG tablet Take 50 mg by mouth every 6 (six) hours as needed (pain).    Marland Kitchen  vitamin C (ASCORBIC ACID) 500 MG tablet Take 500 mg by mouth 2 (two) times daily.    . [DISCONTINUED] fluticasone (FLONASE) 50 MCG/ACT nasal spray Place 1 spray into the nose daily. 16 g 2  . [DISCONTINUED] pravastatin (PRAVACHOL) 40 MG tablet Take 1 tablet (40 mg total) by mouth every evening. 90 tablet 3   No current facility-administered medications for this visit.    Allergies:   Adderall; Bee venom; Fluorescein; Lyrica; and Chantix   Social History:  The patient  reports that she has been smoking Cigarettes.  She has a 15 pack-year smoking history. She has never used smokeless tobacco. She reports that she drinks alcohol. She reports that she does not use illicit drugs.   Family History:  The patient's family history includes ADD / ADHD in her brother; CAD in her father; COPD in her mother; Crohn's disease in her daughter; Heart attack (age of onset: 45) in her paternal grandfather; Stroke in her other; Vascular Disease in her mother.  ROS:  Please see the history of present illness.  All other systems are reviewed and otherwise negative.   PHYSICAL EXAM:  VS:  BP 118/72 mmHg  Pulse 70  Ht 5\' 4"  (1.626 m)  Wt 145 lb 12.8 oz (66.134 kg)  BMI 25.01 kg/m2 BMI: Body mass index is 25.01 kg/(m^2). Well nourished, well developed WF in no acute distress HEENT: normocephalic, atraumatic Neck: no JVD, carotid bruits or masses Cardiac:  normal S1, S2; RRR; no murmurs, rubs, or  gallops Lungs:  clear to auscultation bilaterally, no wheezing, rhonchi or rales Abd: soft, nontender, no hepatomegaly, + BS MS: no deformity or atrophy Ext: no edema Skin: warm and dry, no rash Neuro:  moves all extremities spontaneously, no focal abnormalities noted, follows commands Psych: euthymic mood, full affect   EKG:  Done today shows NSR 70bpm with bifasicular block (RBBB, LAFB), prior lateral/inferior infarct, no acute change from prior.  Recent Labs: 03/16/2015: ALT 18; B Natriuretic Peptide 443.7*; BUN 11; Creatinine, Ser 1.01*; Hemoglobin 12.0; Magnesium 1.9; Platelets 237; Potassium 4.4; Sodium 136  02/11/2015: Cholesterol 149; HDL 39*; LDL Cholesterol 76; Total CHOL/HDL Ratio 3.8; Triglycerides 171*; VLDL 34   Estimated Creatinine Clearance: 54.8 mL/min (by C-G formula based on Cr of 1.01).   Wt Readings from Last 3 Encounters:  03/20/15 145 lb 12.8 oz (66.134 kg)  03/16/15 160 lb (72.576 kg)  02/24/15 147 lb (66.679 kg)     Other studies reviewed: Additional studies/records reviewed today include: summarized above  ASSESSMENT AND PLAN:  1. Acute on chronic diastolic CHF - clinically she appears euvolemic today. Would continue current regimen including Lasix for now. Will recheck labs today including BMET and BNP. Low salt diet and fluid restriction reinforced. ADDENDUM 03/21/15: the patient called the office back to clarify the dose of Lasix she had been taking. Apparently this had been 20mg  daily not 40mg  daily. Will continue 20mg  daily for now. We also reduced dose of potassium based on BMET result yesterday. 2. CAD s/p MI/PCI as above - doing well post-MI, overall. Continue DAPT. The patient will bring in her paperwork for Brilinta assistance for [place] to fill out our part. She is using samples in the meantime. Will otherwise continue BB and statin. Note bifascicular block on EKG - this was present on prior tracing as well. She has not had any significant bradycardia but  we will monitor for this. She plans to proceed with cardiac rehab program which I think will be beneficial to  her. 3. Recent AKI - recheck BMET. 4. Essential HTN - controlled today. 5. Dyslipidemia - due for recheck lipids/LFTs later this month given recent statin initiation. 6. Tobacco abuse - counseled regarding importance of cessation. She is really giving it her best shot to quit. 7. Normocytic anemia - resolved by recent labs. 8. Leukocytosis - recheck CBC today.  Disposition: F/u with Dr. Tamala Julian in 3 months.  Current medicines are reviewed at length with the patient today.  The patient did not have any concerns regarding medicines.  Raechel Ache PA-C 03/20/2015 10:47 AM     [facility] [place] [address] [phone] (office)  [phone] (fax)

## 2015-03-20 ENCOUNTER — Encounter: Payer: Self-pay | Admitting: Interventional Cardiology

## 2015-03-20 ENCOUNTER — Encounter: Payer: Self-pay | Admitting: Physician Assistant

## 2015-03-20 ENCOUNTER — Telehealth: Payer: Self-pay

## 2015-03-20 ENCOUNTER — Ambulatory Visit (INDEPENDENT_AMBULATORY_CARE_PROVIDER_SITE_OTHER): Payer: BLUE CROSS/BLUE SHIELD | Admitting: Physician Assistant

## 2015-03-20 VITALS — BP 118/72 | HR 70 | Ht 64.0 in | Wt 145.8 lb

## 2015-03-20 DIAGNOSIS — I5033 Acute on chronic diastolic (congestive) heart failure: Secondary | ICD-10-CM

## 2015-03-20 DIAGNOSIS — E785 Hyperlipidemia, unspecified: Secondary | ICD-10-CM | POA: Diagnosis not present

## 2015-03-20 DIAGNOSIS — D649 Anemia, unspecified: Secondary | ICD-10-CM

## 2015-03-20 DIAGNOSIS — I2510 Atherosclerotic heart disease of native coronary artery without angina pectoris: Secondary | ICD-10-CM | POA: Diagnosis not present

## 2015-03-20 DIAGNOSIS — I4510 Unspecified right bundle-branch block: Secondary | ICD-10-CM | POA: Diagnosis not present

## 2015-03-20 DIAGNOSIS — Z9861 Coronary angioplasty status: Secondary | ICD-10-CM

## 2015-03-20 DIAGNOSIS — D72829 Elevated white blood cell count, unspecified: Secondary | ICD-10-CM

## 2015-03-20 DIAGNOSIS — N179 Acute kidney failure, unspecified: Secondary | ICD-10-CM

## 2015-03-20 DIAGNOSIS — Z720 Tobacco use: Secondary | ICD-10-CM

## 2015-03-20 DIAGNOSIS — I10 Essential (primary) hypertension: Secondary | ICD-10-CM

## 2015-03-20 LAB — BASIC METABOLIC PANEL
BUN: 14 mg/dL (ref 7–25)
CALCIUM: 11.2 mg/dL — AB (ref 8.6–10.4)
CO2: 23 mmol/L (ref 20–31)
CREATININE: 1.04 mg/dL — AB (ref 0.50–0.99)
Chloride: 106 mmol/L (ref 98–110)
Glucose, Bld: 100 mg/dL — ABNORMAL HIGH (ref 65–99)
Potassium: 4.7 mmol/L (ref 3.5–5.3)
SODIUM: 139 mmol/L (ref 135–146)

## 2015-03-20 LAB — CBC
HCT: 41.9 % (ref 36.0–46.0)
HEMOGLOBIN: 14.4 g/dL (ref 12.0–15.0)
MCH: 28.6 pg (ref 26.0–34.0)
MCHC: 34.4 g/dL (ref 30.0–36.0)
MCV: 83.3 fL (ref 78.0–100.0)
MPV: 10.2 fL (ref 8.6–12.4)
PLATELETS: 389 10*3/uL (ref 150–400)
RBC: 5.03 MIL/uL (ref 3.87–5.11)
RDW: 18 % — ABNORMAL HIGH (ref 11.5–15.5)
WBC: 15.8 10*3/uL — AB (ref 4.0–10.5)

## 2015-03-20 LAB — BRAIN NATRIURETIC PEPTIDE: BRAIN NATRIURETIC PEPTIDE: 130.7 pg/mL — AB (ref 0.0–100.0)

## 2015-03-20 MED ORDER — POTASSIUM CHLORIDE CRYS ER 20 MEQ PO TBCR: 20.0000 meq | EXTENDED_RELEASE_TABLET | Freq: Every day | ORAL | Status: DC

## 2015-03-20 NOTE — Patient Instructions (Signed)
Medication Instructions:  Your physician recommends that you continue on your current medications as directed. Please refer to the Current Medication list given to you today.   Labwork: TODAY:  BMET                CBC W/DIFF                BNP   2 WEEKS:  04-03-15  LFT & FASTING LIPIDS  Testing/Procedures: NONE ORDERED  Follow-Up: Your physician recommends that you schedule a follow-up appointment in: [place]. Tamala Julian.   Any Other Special Instructions Will Be Listed Below (If Applicable).

## 2015-03-20 NOTE — Addendum Note (Signed)
Addended by: Eulis Foster on: 03/20/2015 11:33 AM   Modules accepted: Orders

## 2015-03-20 NOTE — Telephone Encounter (Signed)
Patient called back. Per Melina Copa PA, please call patient. BMET is stable and BNP is almost near normal now. Potassium is creeping up so she should reduce her potassium chloride dose to one tablet (34meq) daily. WBC remains elevated for unclear reason - she should follow up with her primary care doctor for further monitoring/evaluation. Recent urine/CXR were without infection. Patient verbalized understanding.

## 2015-03-20 NOTE — Progress Notes (Signed)
Quick Note:  Please call patient. BMET is stable and BNP is almost near normal now. Potassium is creeping up so she should reduce her potassium chloride dose to one tablet (44meq) daily. WBC remains elevated for unclear reason - she should follow up with her primary care doctor for further monitoring/evaluation. Recent urine/CXR were without infection. Dayna Dunn PA-C   ______

## 2015-03-20 NOTE — Addendum Note (Signed)
Addended by: Eulis Foster on: 03/20/2015 11:32 AM   Modules accepted: Orders

## 2015-03-21 ENCOUNTER — Encounter: Payer: Self-pay | Admitting: Interventional Cardiology

## 2015-03-21 MED ORDER — FUROSEMIDE 20 MG PO TABS: 20.0000 mg | ORAL_TABLET | Freq: Every day | ORAL | Status: DC

## 2015-03-21 NOTE — Telephone Encounter (Signed)
Pt aware of Dr.Smith's response below. Pt sts that she will complete the Brilinta assisatance form and drop it off to the office this week. Adv pt that we are currently out of Brilinta 90mg  samples. Adv pt to check back with is later in the week so we can provide her with more samples.  Pt sts that she was seen yesterday by Marcina Millard there was some confusion on pt lasix dosage. Pt sts she checked with her pharmacy to see how it had been filled. Pt sts that she has been taking Lasix 20mg  prn for swelling. Adv pt I will fwd that update to [facility] and call back with her recommendation.  Pt now sts that she has been taking Lasix 20mg  daily. Pt aware of Dayna Dunn,PA recommendation stay on Lasix 20mg  daily. Pt request an Rx for Lasix be sent to her pharmacy. Done

## 2015-03-23 ENCOUNTER — Telehealth: Payer: Self-pay

## 2015-03-23 NOTE — Telephone Encounter (Signed)
-----   Message from Belva Crome, MD sent at 03/21/2015  5:47 PM EDT ----- Labs are normal

## 2015-03-23 NOTE — Telephone Encounter (Signed)
Pt aware of lab results. Labs are normal. Pt verbalized understanding 

## 2015-03-31 ENCOUNTER — Other Ambulatory Visit (INDEPENDENT_AMBULATORY_CARE_PROVIDER_SITE_OTHER): Payer: BLUE CROSS/BLUE SHIELD | Admitting: *Deleted

## 2015-03-31 ENCOUNTER — Telehealth: Payer: Self-pay

## 2015-03-31 DIAGNOSIS — D62 Acute posthemorrhagic anemia: Secondary | ICD-10-CM

## 2015-03-31 DIAGNOSIS — E876 Hypokalemia: Secondary | ICD-10-CM

## 2015-03-31 LAB — LIPID PANEL
CHOLESTEROL: 101 mg/dL — AB (ref 125–200)
HDL: 46 mg/dL (ref >=46)
LDL Cholesterol: 24 mg/dL (ref <130)
TRIGLYCERIDES: 155 mg/dL — AB (ref <150)
Total CHOL/HDL Ratio: 2.2 Ratio (ref <=5.0)
VLDL: 31 mg/dL — ABNORMAL HIGH (ref <30)

## 2015-03-31 LAB — HEPATIC FUNCTION PANEL
ALK PHOS: 121 U/L (ref 33–130)
ALT: 22 U/L (ref 6–29)
AST: 23 U/L (ref 10–35)
Albumin: 3.8 g/dL (ref 3.6–5.1)
TOTAL PROTEIN: 6.8 g/dL (ref 6.1–8.1)
Total Bilirubin: 0.3 mg/dL (ref 0.2–1.2)

## 2015-03-31 NOTE — Telephone Encounter (Signed)
Samples of Brilinta 90 MG placed up front for patient to pick up today. She stated she dropped off her patient assistance program form today  Charlie Pitter, PA-C at 03/19/2015 12:02 PM  ticagrelor (BRILINTA) 90 MG TABS tabletTake 1 tablet (90 mg total) by mouth 2 (two) times daily 1. CAD s/p MI/PCI as above - doing well post-MI, overall. Continue DAPT. The patient will bring in her paperwork for Brilinta assistance for [place] to fill out our part. She is using samples in the meantime. Will otherwise continue BB and statin. Note bifascicular block on EKG - this was present on prior tracing as well. She has not had any significant bradycardia but we will monitor for this. She plans to proceed with cardiac rehab program which I think will be beneficial to her. Patient Instructions     Medication Instructions:  Your physician recommends that you continue on your current medications as directed. Please refer to the Current Medication list given to you today.

## 2015-03-31 NOTE — Addendum Note (Signed)
Addended by: Eulis Foster on: 03/31/2015 11:17 AM   Modules accepted: Orders

## 2015-04-03 ENCOUNTER — Other Ambulatory Visit: Payer: BLUE CROSS/BLUE SHIELD

## 2015-04-03 ENCOUNTER — Telehealth: Payer: Self-pay

## 2015-04-03 NOTE — Telephone Encounter (Signed)
Patient Assistance forms faxed to [place] for Brilinta  90mg .

## 2015-04-04 ENCOUNTER — Telehealth: Payer: Self-pay

## 2015-04-04 ENCOUNTER — Other Ambulatory Visit: Payer: Self-pay

## 2015-04-04 DIAGNOSIS — E785 Hyperlipidemia, unspecified: Secondary | ICD-10-CM

## 2015-04-04 MED ORDER — ROSUVASTATIN CALCIUM 20 MG PO TABS: 20.0000 mg | ORAL_TABLET | Freq: Every evening | ORAL | Status: DC

## 2015-04-04 MED ORDER — ROSUVASTATIN CALCIUM 10 MG PO TABS: 10.0000 mg | ORAL_TABLET | Freq: Every evening | ORAL | Status: DC

## 2015-04-04 NOTE — Telephone Encounter (Signed)
Pt aware of lab results and Dr.Smith's recommendation. Decrease rosuvastatin to 10 mg daily.Recheck in 2 months. Pt sts that's she does not need a new Rx at this time. She has plenty of 40mg  tabs on hand. She will cut them in half. Lab appt for fasting labs scheduled for 12/12. Pt verbalized understanding.

## 2015-04-04 NOTE — Telephone Encounter (Signed)
-----   Message from Belva Crome, MD sent at 04/02/2015  4:56 PM EDT ----- Decrease rosuvastatin to 10 mg daily. Recheck in 2 months

## 2015-04-12 ENCOUNTER — Telehealth: Payer: Self-pay | Admitting: Interventional Cardiology

## 2015-04-12 NOTE — Telephone Encounter (Signed)
Calling stating she was put on Brilinta after stent in September.  States she was trying to break up her dog and another dog wresting yesterday and they hit up against her chest.  States she is very bruised on chest and arms and wants to know if this is normal.  Advised that it is normal.  She is also taking an ASA 81 mg daily which also will cause the bruising. She states she was just "freaked" out about the bruising.  Reassured.

## 2015-04-12 NOTE — Telephone Encounter (Signed)
Left message to call back  

## 2015-04-12 NOTE — Telephone Encounter (Signed)
New Message   Pt wants a returned call

## 2015-04-12 NOTE — Telephone Encounter (Signed)
Pt c/o medication issue:  1. Name of Medication: Brulenta  2. How are you currently taking this medication (dosage and times per day)? 90 mg 2x per day  3. Are you having a reaction (difficulty breathing--STAT)?   4. What is your medication issue? Pt calling stating that she has bruises all over and thinks it might be because of this medication.

## 2015-04-12 NOTE — Telephone Encounter (Signed)
Follow up   Pt wants a returned call

## 2015-04-14 ENCOUNTER — Telehealth: Payer: Self-pay | Admitting: Physician Assistant

## 2015-04-14 ENCOUNTER — Observation Stay (HOSPITAL_COMMUNITY)
Admission: EM | Admit: 2015-04-14 | Discharge: 2015-04-15 | Disposition: A | Discharge status: Home / Self Care | Payer: BLUE CROSS/BLUE SHIELD | Attending: Internal Medicine | Admitting: Internal Medicine

## 2015-04-14 ENCOUNTER — Encounter (HOSPITAL_COMMUNITY): Payer: Self-pay | Admitting: Emergency Medicine

## 2015-04-14 ENCOUNTER — Emergency Department (HOSPITAL_COMMUNITY): Payer: BLUE CROSS/BLUE SHIELD

## 2015-04-14 ENCOUNTER — Emergency Department (INDEPENDENT_AMBULATORY_CARE_PROVIDER_SITE_OTHER)
Admission: EM | Admit: 2015-04-14 | Discharge: 2015-04-14 | Disposition: A | Discharge status: Nursing Home (Includes ICF) | Payer: BLUE CROSS/BLUE SHIELD | Source: Home / Self Care | Attending: Family Medicine | Admitting: Family Medicine

## 2015-04-14 DIAGNOSIS — Z888 Allergy status to other drugs, medicaments and biological substances status: Secondary | ICD-10-CM | POA: Insufficient documentation

## 2015-04-14 DIAGNOSIS — R0609 Other forms of dyspnea: Secondary | ICD-10-CM | POA: Diagnosis not present

## 2015-04-14 DIAGNOSIS — E785 Hyperlipidemia, unspecified: Secondary | ICD-10-CM | POA: Diagnosis present

## 2015-04-14 DIAGNOSIS — I2510 Atherosclerotic heart disease of native coronary artery without angina pectoris: Secondary | ICD-10-CM | POA: Diagnosis present

## 2015-04-14 DIAGNOSIS — I10 Essential (primary) hypertension: Secondary | ICD-10-CM | POA: Diagnosis present

## 2015-04-14 DIAGNOSIS — K589 Irritable bowel syndrome without diarrhea: Secondary | ICD-10-CM | POA: Diagnosis not present

## 2015-04-14 DIAGNOSIS — R0602 Shortness of breath: Secondary | ICD-10-CM | POA: Diagnosis present

## 2015-04-14 DIAGNOSIS — R609 Edema, unspecified: Secondary | ICD-10-CM | POA: Diagnosis not present

## 2015-04-14 DIAGNOSIS — F419 Anxiety disorder, unspecified: Secondary | ICD-10-CM | POA: Diagnosis not present

## 2015-04-14 DIAGNOSIS — I429 Cardiomyopathy, unspecified: Secondary | ICD-10-CM | POA: Insufficient documentation

## 2015-04-14 DIAGNOSIS — Z85828 Personal history of other malignant neoplasm of skin: Secondary | ICD-10-CM | POA: Diagnosis not present

## 2015-04-14 DIAGNOSIS — M797 Fibromyalgia: Secondary | ICD-10-CM | POA: Insufficient documentation

## 2015-04-14 DIAGNOSIS — I252 Old myocardial infarction: Secondary | ICD-10-CM | POA: Insufficient documentation

## 2015-04-14 DIAGNOSIS — G8929 Other chronic pain: Secondary | ICD-10-CM | POA: Insufficient documentation

## 2015-04-14 DIAGNOSIS — K219 Gastro-esophageal reflux disease without esophagitis: Secondary | ICD-10-CM | POA: Diagnosis not present

## 2015-04-14 DIAGNOSIS — M1990 Unspecified osteoarthritis, unspecified site: Secondary | ICD-10-CM | POA: Diagnosis not present

## 2015-04-14 DIAGNOSIS — R7989 Other specified abnormal findings of blood chemistry: Secondary | ICD-10-CM

## 2015-04-14 DIAGNOSIS — Z955 Presence of coronary angioplasty implant and graft: Secondary | ICD-10-CM | POA: Diagnosis not present

## 2015-04-14 DIAGNOSIS — I5032 Chronic diastolic (congestive) heart failure: Secondary | ICD-10-CM | POA: Diagnosis present

## 2015-04-14 DIAGNOSIS — Z91030 Bee allergy status: Secondary | ICD-10-CM | POA: Diagnosis not present

## 2015-04-14 DIAGNOSIS — I4510 Unspecified right bundle-branch block: Secondary | ICD-10-CM | POA: Diagnosis not present

## 2015-04-14 DIAGNOSIS — M549 Dorsalgia, unspecified: Secondary | ICD-10-CM | POA: Diagnosis not present

## 2015-04-14 DIAGNOSIS — R600 Localized edema: Secondary | ICD-10-CM | POA: Diagnosis present

## 2015-04-14 DIAGNOSIS — F17210 Nicotine dependence, cigarettes, uncomplicated: Secondary | ICD-10-CM | POA: Insufficient documentation

## 2015-04-14 DIAGNOSIS — Z79899 Other long term (current) drug therapy: Secondary | ICD-10-CM | POA: Diagnosis not present

## 2015-04-14 DIAGNOSIS — Z7982 Long term (current) use of aspirin: Secondary | ICD-10-CM | POA: Diagnosis not present

## 2015-04-14 DIAGNOSIS — J309 Allergic rhinitis, unspecified: Secondary | ICD-10-CM | POA: Diagnosis not present

## 2015-04-14 DIAGNOSIS — F909 Attention-deficit hyperactivity disorder, unspecified type: Secondary | ICD-10-CM | POA: Diagnosis not present

## 2015-04-14 LAB — CBC
HCT: 38.2 % (ref 36.0–46.0)
HEMOGLOBIN: 12.5 g/dL (ref 12.0–15.0)
MCH: 27.9 pg (ref 26.0–34.0)
MCHC: 32.7 g/dL (ref 30.0–36.0)
MCV: 85.3 fL (ref 78.0–100.0)
Platelets: 271 10*3/uL (ref 150–400)
RBC: 4.48 MIL/uL (ref 3.87–5.11)
RDW: 16.3 % — AB (ref 11.5–15.5)
WBC: 11 10*3/uL — ABNORMAL HIGH (ref 4.0–10.5)

## 2015-04-14 LAB — BASIC METABOLIC PANEL
ANION GAP: 10 (ref 5–15)
BUN: 13 mg/dL (ref 6–20)
CALCIUM: 10.4 mg/dL — AB (ref 8.9–10.3)
CO2: 26 mmol/L (ref 22–32)
Chloride: 106 mmol/L (ref 101–111)
Creatinine, Ser: 1.07 mg/dL — ABNORMAL HIGH (ref 0.44–1.00)
GFR, EST NON AFRICAN AMERICAN: 55 mL/min — AB (ref >60)
GLUCOSE: 107 mg/dL — AB (ref 65–99)
Potassium: 4.7 mmol/L (ref 3.5–5.1)
SODIUM: 142 mmol/L (ref 135–145)

## 2015-04-14 LAB — BRAIN NATRIURETIC PEPTIDE: B NATRIURETIC PEPTIDE 5: 382.8 pg/mL — AB (ref 0.0–100.0)

## 2015-04-14 LAB — I-STAT TROPONIN, ED: TROPONIN I, POC: 0.02 ng/mL (ref 0.00–0.08)

## 2015-04-14 MED ORDER — GABAPENTIN 400 MG PO CAPS
400.0000 mg | ORAL_CAPSULE | Freq: Every day | ORAL | Status: DC
  Administered 2015-04-15: 400 mg via ORAL
  Filled 2015-04-14: qty 1

## 2015-04-14 MED ORDER — ASPIRIN EC 81 MG PO TBEC
81.0000 mg | DELAYED_RELEASE_TABLET | Freq: Every day | ORAL | Status: DC
Start: 2015-04-15 — End: 2015-04-15
  Administered 2015-04-15: 81 mg via ORAL
  Filled 2015-04-14: qty 1

## 2015-04-14 MED ORDER — ROSUVASTATIN CALCIUM 10 MG PO TABS: 10.0000 mg | ORAL_TABLET | Freq: Every evening | ORAL | Status: DC

## 2015-04-14 MED ORDER — OXYCODONE-ACETAMINOPHEN 10-325 MG PO TABS: 1.0000 | ORAL_TABLET | Freq: Four times a day (QID) | ORAL | Status: DC | PRN

## 2015-04-14 MED ORDER — TICAGRELOR 90 MG PO TABS: 90.0000 mg | ORAL_TABLET | Freq: Two times a day (BID) | ORAL | Status: DC

## 2015-04-14 MED ORDER — ONDANSETRON HCL 4 MG/2ML IJ SOLN: 4.0000 mg | Freq: Four times a day (QID) | INTRAMUSCULAR | Status: DC | PRN

## 2015-04-14 MED ORDER — CYCLOBENZAPRINE HCL 10 MG PO TABS: 10.0000 mg | ORAL_TABLET | Freq: Three times a day (TID) | ORAL | Status: DC | PRN

## 2015-04-14 MED ORDER — ENOXAPARIN SODIUM 40 MG/0.4ML ~~LOC~~ SOLN
40.0000 mg | SUBCUTANEOUS | Status: DC
  Administered 2015-04-15: 40 mg via SUBCUTANEOUS
  Filled 2015-04-14: qty 0.4

## 2015-04-14 MED ORDER — METOPROLOL TARTRATE 50 MG PO TABS
50.0000 mg | ORAL_TABLET | Freq: Two times a day (BID) | ORAL | Status: DC
  Administered 2015-04-15 (×2): 50 mg via ORAL
  Filled 2015-04-14 (×2): qty 1

## 2015-04-14 MED ORDER — FUROSEMIDE 20 MG PO TABS
20.0000 mg | ORAL_TABLET | Freq: Every day | ORAL | Status: DC
  Administered 2015-04-15: 20 mg via ORAL
  Filled 2015-04-14: qty 1

## 2015-04-14 MED ORDER — ACETAMINOPHEN 325 MG PO TABS: 650.0000 mg | ORAL_TABLET | ORAL | Status: DC | PRN

## 2015-04-14 NOTE — ED Provider Notes (Signed)
CSN: [id number]     Arrival date & time 04/14/15  1919 History   First MD Initiated Contact with Patient 04/14/15 2002     Chief Complaint  Patient presents with  . Shortness of Breath  . Leg Swelling   (Consider location/radiation/quality/duration/timing/severity/associated sxs/prior Treatment) Patient is a 59 y.o. female presenting with shortness of breath. The history is provided by the patient.  Shortness of Breath Severity:  Moderate Onset quality:  Sudden Progression:  Unchanged Chronicity:  New Context comment:  Stent x3 in 01/2015 with mult meds post mi , today with sob with 1 flight of stairs at home, still smoking Associated symptoms: no chest pain and no wheezing     Past Medical History  Diagnosis Date  . ADHD   . Allergic rhinitis   . Basal cell carcinoma   . Fibromyalgia   . GERD   . Dyslipidemia   . Essential hypertension   . MENOPAUSAL SYNDROME 12/15/2006  . Osteoarthritis   . Renal colic 5/63/8937  . VAGINITIS, ATROPHIC 01/05/2007  . Histoplasmosis with retinitis   . Anxiety   . IBS (irritable bowel syndrome)   . Anal fissure   . Thoracic outlet syndrome   . Lipoma     on chest  . CAD (coronary artery disease)     a. Inferior STEMI -  initial cath 02/11/15 revealed 80% mid LAD, 70% D1, chronically occluded mid LCX, 50% mid RCA, 70% distal RCA, acute occlusion of prox RCA s/p DES to prox RCA. Planned re-cath 02/16/15 s/p DES to mLAD, DES to distal RCA. Med rx for chronically occluded mLCx.  . Cardiogenic shock ([place])     a. 01/2015 in setting of inferior STEMI, possibly 2/2 cardiogenic shock.  . NSVT (nonsustained ventricular tachycardia) ([place])     a. 6 beats 01/2015 after STEMI in setting of low K, Mg.  . Tobacco abuse   . Normocytic anemia   . Valvular heart disease     a. Mild AI/MR, mod TR by echo 02/12/15.  Marland Kitchen Hypokalemia   . Hypomagnesemia   . Leukocytosis   . Acute kidney injury ([place])   . Chronic diastolic CHF (congestive heart failure) ([place])   .  RBBB    Past Surgical History  Procedure Laterality Date  . Appendectomy  1973  . Tonsillectomy  1980  . Dilation and curettage of uterus  1982  . Tonsillectomy    . Eye surgery      Lazer to right x 6  . Spine surgery  04/16/2012  . Cardiac catheterization N/A 02/11/2015    Procedure: Left Heart Cath and Coronary Angiography;  Surgeon: Belva Crome, MD;  Location: [facility];  Service: Cardiovascular;  Laterality: N/A;  . Cardiac catheterization N/A 02/11/2015    Procedure: Coronary Stent Intervention;  Surgeon: Belva Crome, MD;  Location: [facility];  Service: Cardiovascular;  Laterality: N/A;  . Cardiac catheterization N/A 02/16/2015    Procedure: Coronary Stent Intervention;  Surgeon: Peter M Martinique, MD;  Location: [facility];  Service: Cardiovascular;  Laterality: N/A;   Family History  Problem Relation Age of Onset  . COPD Mother   . Vascular Disease Mother   . ADD / ADHD Brother   . CAD Father   . Crohn's disease Daughter   . Heart attack Paternal Grandfather 72  . Stroke Other     1st degree relative <60   Social History  Substance Use Topics  . Smoking status: Current  Some Day Smoker -- 0.50 packs/day for 30 years    Types: Cigarettes    Last Attempt to Quit: 02/24/2015  . Smokeless tobacco: Never Used     Comment: using electronic cigarette as of 03/2012  . Alcohol Use: 0.0 oz/week    0 Standard drinks or equivalent per week     Comment: social   OB History    No data available     Review of Systems  Constitutional: Positive for appetite change.  Respiratory: Positive for shortness of breath. Negative for chest tightness and wheezing.   Cardiovascular: Positive for leg swelling. Negative for chest pain and palpitations.  All other systems reviewed and are negative.   Allergies  Adderall; Bee venom; Fluorescein; Lyrica; and Chantix  Home Medications   Prior to Admission medications   Medication Sig Start Date End Date Taking?  Authorizing Provider  aspirin EC 81 MG tablet Take 1 tablet (81 mg total) by mouth daily. 02/17/15   Dayna N Dunn, PA-C  B Complex-Folic Acid (SUPER B COMPLEX MAXI PO) Take 1 capsule by mouth daily.    Historical Provider, MD  Calcium Citrate-Vitamin D (CITRACAL + D PO) Take 1 tablet by mouth 2 (two) times daily.    Historical Provider, MD  Coenzyme Q10 (COQ-10) 200 MG CAPS Take 1 tablet by mouth 2 (two) times daily.    Historical Provider, MD  cyclobenzaprine (FLEXERIL) 10 MG tablet Take 10 mg by mouth 3 (three) times daily as needed for muscle spasms (also 20 mg at night as needed).     Historical Provider, MD  furosemide (LASIX) 20 MG tablet Take 1 tablet (20 mg total) by mouth daily. 03/21/15   Dayna N Dunn, PA-C  gabapentin (NEURONTIN) 400 MG capsule Take 400 mg by mouth at bedtime as needed (nerve pain).     Historical Provider, MD  magnesium oxide (MAG-OX) 400 (241.3 MG) MG tablet Take 1 tablet (400 mg total) by mouth daily. 02/17/15   Dayna N Dunn, PA-C  metoprolol tartrate (LOPRESSOR) 50 MG tablet Take 1 tablet (50 mg total) by mouth 2 (two) times daily. 02/17/15   Dayna N Dunn, PA-C  Multiple Vitamin (MULTIVITAMIN WITH MINERALS) TABS Take 0.5 tablets by mouth 2 (two) times daily.    Historical Provider, MD  nitroGLYCERIN (NITROSTAT) 0.4 MG SL tablet Place 1 tablet (0.4 mg total) under the tongue every 5 (five) minutes as needed for chest pain (up to 3 doses). 02/17/15   Dayna N Dunn, PA-C  oxyCODONE-acetaminophen (PERCOCET) 10-325 MG tablet Take 1-2 tablets by mouth every 6 (six) hours as needed. pain 03/07/15   Historical Provider, MD  potassium chloride SA (K-DUR,KLOR-CON) 20 MEQ tablet Take 1 tablet (20 mEq total) by mouth daily. 03/20/15   Dayna N Dunn, PA-C  rosuvastatin (CRESTOR) 10 MG tablet Take 1 tablet (10 mg total) by mouth every evening. 04/04/15   Belva Crome, MD  sodium chloride (OCEAN) 0.65 % SOLN nasal spray Place 1 spray into both nostrils daily as needed for congestion.     Historical Provider, MD  ticagrelor (BRILINTA) 90 MG TABS tablet Take 1 tablet (90 mg total) by mouth 2 (two) times daily. 02/17/15   Dayna N Dunn, PA-C  traMADol (ULTRAM) 50 MG tablet Take 50 mg by mouth every 6 (six) hours as needed (pain).    Historical Provider, MD  vitamin C (ASCORBIC ACID) 500 MG tablet Take 500 mg by mouth 2 (two) times daily.    Historical Provider, MD  Meds Ordered and Administered this Visit  Medications - No data to display  BP 143/90 mmHg  Pulse 77  Temp(Src) 98.1 F (36.7 C) (Oral)  Resp 18  SpO2 100% No data found.   Physical Exam  Constitutional: She is oriented to person, place, and time. She appears well-developed and well-nourished.  Eyes: Conjunctivae are normal. Pupils are equal, round, and reactive to light.  Neck: Normal range of motion. Neck supple.  Cardiovascular: Normal rate, regular rhythm, normal heart sounds and intact distal pulses.   Pulmonary/Chest: Effort normal. No respiratory distress. She has decreased breath sounds. She has no wheezes. She has no rales.  Abdominal: Soft. Bowel sounds are normal.  Musculoskeletal: She exhibits edema.  Lymphadenopathy:    She has no cervical adenopathy.  Neurological: She is alert and oriented to person, place, and time.  Skin: Skin is warm and dry.  Nursing note and vitals reviewed.   ED Course  Procedures (including critical care time)  Labs Review Labs Reviewed - No data to display  Imaging Review No results found.   Visual Acuity Review  Right Eye Distance:   Left Eye Distance:   Bilateral Distance:    Right Eye Near:   Left Eye Near:    Bilateral Near:         MDM   1. Dyspnea on exertion    Sent for eval of sob, doe and edema right leg today. H/o mi with stents in 01/2015. Pt denies cp at present, clinically stable.    Billy Fischer, MD 04/14/15 2024

## 2015-04-14 NOTE — ED Notes (Signed)
Pt. reports exertional dyspnea onset this week worse today , denies cough or congestion , no fever or chills , pt. added dog bite at right knee 3 days ago with mild lower leg swelling/edema .

## 2015-04-14 NOTE — Telephone Encounter (Signed)
Pt called because she has been having  1. Increasing DOE, pt has no scales and is not weighing herself. She is getting SOB walking shorter and shorter distances.  2. LE edema, basically unilateral, in the leg where she had a dog bite 3 days ago. She is concerned about cellulitis in that leg.  Discussed the situation with the patient. I am uncomfortable with trying to manage/evaluate her as an outpatient. I strongly advised her to seek help at an Urgent Care or ER.  Pt was reluctant, but agreed.  Lenoard Aden 04/14/2015 6:55 PM Beeper [phone]

## 2015-04-14 NOTE — ED Notes (Signed)
Pt st's she becomes short of breath when she walks up stairs or walks fast.  Pt talking in full sentences

## 2015-04-14 NOTE — ED Notes (Signed)
Patient with right leg swelling and soreness and increased sob today.  Patient was admitted for cardiac catheterization and stent placement x 3 .

## 2015-04-14 NOTE — ED Provider Notes (Signed)
CSN: [id number]     Arrival date & time 04/14/15  2028 History   First MD Initiated Contact with Patient 04/14/15 2125     Chief Complaint  Patient presents with  . Shortness of Breath  . Animal Bite     (Consider location/radiation/quality/duration/timing/severity/associated sxs/prior Treatment) The history is provided by the patient and the spouse.  Tristin Gladman is a 59 y.o. female hx of HTN, 2 cardiac stents a month ago with balloon pump, who presenting with shortness of breath with exertion. Shortness of breath with exertion over the last week. States that whenever she walks up the stairs she gets very short winded. Denies any cough or fever. Denies any chest pain. Has some minimal leg swelling as well. Also has a dog bite 2 days ago. States the tetanus shot is up-to-date. Call cardiology and was sent to urgent care. Urgent care sent here for evaluation.   Past Medical History  Diagnosis Date  . ADHD   . Allergic rhinitis   . Basal cell carcinoma   . Fibromyalgia   . GERD   . Dyslipidemia   . Essential hypertension   . MENOPAUSAL SYNDROME 12/15/2006  . Osteoarthritis   . Renal colic 4/48/1856  . VAGINITIS, ATROPHIC 01/05/2007  . Histoplasmosis with retinitis   . Anxiety   . IBS (irritable bowel syndrome)   . Anal fissure   . Thoracic outlet syndrome   . Lipoma     on chest  . CAD (coronary artery disease)     a. Inferior STEMI -  initial cath 02/11/15 revealed 80% mid LAD, 70% D1, chronically occluded mid LCX, 50% mid RCA, 70% distal RCA, acute occlusion of prox RCA s/p DES to prox RCA. Planned re-cath 02/16/15 s/p DES to mLAD, DES to distal RCA. Med rx for chronically occluded mLCx.  . Cardiogenic shock ([place])     a. 01/2015 in setting of inferior STEMI, possibly 2/2 cardiogenic shock.  . NSVT (nonsustained ventricular tachycardia) ([place])     a. 6 beats 01/2015 after STEMI in setting of low K, Mg.  . Tobacco abuse   . Normocytic anemia   . Valvular heart disease     a. Mild  AI/MR, mod TR by echo 02/12/15.  Marland Kitchen Hypokalemia   . Hypomagnesemia   . Leukocytosis   . Acute kidney injury ([place])   . Chronic diastolic CHF (congestive heart failure) ([place])   . RBBB    Past Surgical History  Procedure Laterality Date  . Appendectomy  1973  . Tonsillectomy  1980  . Dilation and curettage of uterus  1982  . Tonsillectomy    . Eye surgery      Lazer to right x 6  . Spine surgery  04/16/2012  . Cardiac catheterization N/A 02/11/2015    Procedure: Left Heart Cath and Coronary Angiography;  Surgeon: Belva Crome, MD;  Location: [place] [facility];  Service: Cardiovascular;  Laterality: N/A;  . Cardiac catheterization N/A 02/11/2015    Procedure: Coronary Stent Intervention;  Surgeon: Belva Crome, MD;  Location: [place] [facility];  Service: Cardiovascular;  Laterality: N/A;  . Cardiac catheterization N/A 02/16/2015    Procedure: Coronary Stent Intervention;  Surgeon: Peter M Martinique, MD;  Location: [facility];  Service: Cardiovascular;  Laterality: N/A;  . Coronary stent placement     Family History  Problem Relation Age of Onset  . COPD Mother   . Vascular Disease Mother   . ADD / ADHD Brother   .  CAD Father   . Crohn's disease Daughter   . Heart attack Paternal Grandfather 45  . Stroke Other     1st degree relative <60   Social History  Substance Use Topics  . Smoking status: Current Some Day Smoker -- 0.50 packs/day for 30 years    Types: Cigarettes    Last Attempt to Quit: 02/24/2015  . Smokeless tobacco: Never Used     Comment: using electronic cigarette as of 03/2012  . Alcohol Use: 0.0 oz/week    0 Standard drinks or equivalent per week     Comment: social   OB History    No data available     Review of Systems  Respiratory: Positive for shortness of breath.   All other systems reviewed and are negative.     Allergies  Adderall; Bee venom; Fluorescein; Lyrica; and Chantix  Home Medications   Prior to Admission medications    Medication Sig Start Date End Date Taking? Authorizing Provider  aspirin EC 81 MG tablet Take 1 tablet (81 mg total) by mouth daily. 02/17/15  Yes Dayna N Dunn, PA-C  B Complex-Folic Acid (SUPER B COMPLEX MAXI PO) Take 1 capsule by mouth at bedtime.    Yes Historical Provider, MD  Cholecalciferol (VITAMIN D PO) Take 1 tablet by mouth 2 (two) times daily.   Yes Historical Provider, MD  Coenzyme Q10 (COQ-10) 200 MG CAPS Take 1 tablet by mouth 2 (two) times daily.   Yes Historical Provider, MD  cyclobenzaprine (FLEXERIL) 10 MG tablet Take 10 mg by mouth 3 (three) times daily as needed for muscle spasms (also 20 mg at night as needed).    Yes Historical Provider, MD  furosemide (LASIX) 20 MG tablet Take 1 tablet (20 mg total) by mouth daily. 03/21/15  Yes Dayna N Dunn, PA-C  gabapentin (NEURONTIN) 400 MG capsule Take 400 mg by mouth at bedtime.    Yes Historical Provider, MD  magnesium oxide (MAG-OX) 400 (241.3 MG) MG tablet Take 1 tablet (400 mg total) by mouth daily. 02/17/15  Yes Dayna N Dunn, PA-C  metoprolol tartrate (LOPRESSOR) 50 MG tablet Take 1 tablet (50 mg total) by mouth 2 (two) times daily. 02/17/15  Yes Dayna N Dunn, PA-C  Multiple Vitamin (MULTIVITAMIN WITH MINERALS) TABS Take 0.5 tablets by mouth 2 (two) times daily.   Yes Historical Provider, MD  nitroGLYCERIN (NITROSTAT) 0.4 MG SL tablet Place 1 tablet (0.4 mg total) under the tongue every 5 (five) minutes as needed for chest pain (up to 3 doses). 02/17/15  Yes Dayna N Dunn, PA-C  oxyCODONE-acetaminophen (PERCOCET) 10-325 MG tablet Take 1-2 tablets by mouth every 6 (six) hours as needed. pain 03/07/15  Yes Historical Provider, MD  potassium chloride SA (K-DUR,KLOR-CON) 20 MEQ tablet Take 1 tablet (20 mEq total) by mouth daily. 03/20/15  Yes Dayna N Dunn, PA-C  rosuvastatin (CRESTOR) 10 MG tablet Take 1 tablet (10 mg total) by mouth every evening. 04/04/15  Yes Belva Crome, MD  sodium chloride (OCEAN) 0.65 % SOLN nasal spray Place 1 spray into  both nostrils daily as needed for congestion.   Yes Historical Provider, MD  ticagrelor (BRILINTA) 90 MG TABS tablet Take 1 tablet (90 mg total) by mouth 2 (two) times daily. 02/17/15  Yes Dayna N Dunn, PA-C  traMADol (ULTRAM) 50 MG tablet Take 50 mg by mouth every 6 (six) hours as needed (pain).   Yes Historical Provider, MD  vitamin C (ASCORBIC ACID) 500 MG tablet Take 500 mg  by mouth 2 (two) times daily.   Yes Historical Provider, MD   Ht 5\' 3"  (1.6 m)  Wt 154 lb (69.854 kg)  BMI 27.29 kg/m2 Physical Exam  Constitutional: She is oriented to person, place, and time. She appears well-developed and well-nourished.  HENT:  Head: Normocephalic.  Mouth/Throat: Oropharynx is clear and moist.  Eyes: Conjunctivae are normal. Pupils are equal, round, and reactive to light.  Neck: Normal range of motion.  Cardiovascular: Normal rate, regular rhythm and normal heart sounds.   Pulmonary/Chest: Effort normal and breath sounds normal. No respiratory distress. She has no wheezes. She has no rales.  Abdominal: Soft. Bowel sounds are normal. She exhibits no distension. There is no tenderness. There is no rebound.  Musculoskeletal: Normal range of motion.  Bite marks R shin with no signs of cellulitis. No calf tenderness. 1+ edema bilaterally   Neurological: She is alert and oriented to person, place, and time.  Skin: Skin is warm and dry.  Psychiatric: She has a normal mood and affect. Her behavior is normal. Judgment and thought content normal.  Nursing note and vitals reviewed.   ED Course  Procedures (including critical care time) Labs Review Labs Reviewed  BASIC METABOLIC PANEL - Abnormal; Notable for the following:    Glucose, Bld 107 (*)    Creatinine, Ser 1.07 (*)    Calcium 10.4 (*)    GFR calc non Af Amer 55 (*)    All other components within normal limits  CBC - Abnormal; Notable for the following:    WBC 11.0 (*)    RDW 16.3 (*)    All other components within normal limits  BRAIN  NATRIURETIC PEPTIDE  I-STAT TROPOININ, ED    Imaging Review Dg Chest 2 View  04/14/2015  CLINICAL DATA:  Shortness of breath for several days EXAM: CHEST  2 VIEW COMPARISON:  March 16, 2015 FINDINGS: There is no edema or consolidation. The heart size and pulmonary vascularity are normal. No adenopathy. There is a left anterior descending coronary artery stent. There is postoperative change in the mid and lower thoracic as well as visualized upper lumbar spine. IMPRESSION: No edema or consolidation. Electronically Signed   By: Lowella Grip III M.D.   On: 04/14/2015 21:18   I have personally reviewed and evaluated these images and lab results as part of my medical decision-making.   EKG Interpretation   Date/Time:  Friday April 14 2015 20:34:22 EDT Ventricular Rate:  74 PR Interval:  106 QRS Duration: 128 QT Interval:  442 QTC Calculation: 490 R Axis:   -110 Text Interpretation:  Sinus rhythm with short PR Right bundle branch block  Inferior infarct , age undetermined Anterolateral infarct , age  undetermined Abnormal ECG poor baseline, grossly unchanged  Confirmed by  [place]  MD, Nefertari Rebman ([id number]) on 04/14/2015 8:51:50 PM      MDM   Final diagnoses:  None    Ambrielle Kington is a 59 y.o. female here with shortness of breath with exertion. Given that she had two stents recently with balloon pump, I am concerned for unstable angina vs CHF. No EKG changes. Trop neg x 1. CXR clear. No chest pain in the ED. Consulted Dr. Rosanna Randy from cardiology, who will see patient. Will admit for serial troponins.    Wandra Arthurs, MD 04/14/15 2218

## 2015-04-15 ENCOUNTER — Observation Stay (HOSPITAL_BASED_OUTPATIENT_CLINIC_OR_DEPARTMENT_OTHER): Payer: BLUE CROSS/BLUE SHIELD

## 2015-04-15 ENCOUNTER — Encounter (HOSPITAL_COMMUNITY): Payer: Self-pay | Admitting: Radiology

## 2015-04-15 ENCOUNTER — Observation Stay (HOSPITAL_COMMUNITY): Payer: BLUE CROSS/BLUE SHIELD

## 2015-04-15 DIAGNOSIS — K219 Gastro-esophageal reflux disease without esophagitis: Secondary | ICD-10-CM

## 2015-04-15 DIAGNOSIS — I5032 Chronic diastolic (congestive) heart failure: Secondary | ICD-10-CM | POA: Diagnosis not present

## 2015-04-15 DIAGNOSIS — I2510 Atherosclerotic heart disease of native coronary artery without angina pectoris: Secondary | ICD-10-CM

## 2015-04-15 DIAGNOSIS — R0609 Other forms of dyspnea: Secondary | ICD-10-CM | POA: Diagnosis not present

## 2015-04-15 DIAGNOSIS — E785 Hyperlipidemia, unspecified: Secondary | ICD-10-CM | POA: Diagnosis not present

## 2015-04-15 DIAGNOSIS — R609 Edema, unspecified: Secondary | ICD-10-CM

## 2015-04-15 DIAGNOSIS — I10 Essential (primary) hypertension: Secondary | ICD-10-CM

## 2015-04-15 DIAGNOSIS — R600 Localized edema: Secondary | ICD-10-CM | POA: Diagnosis present

## 2015-04-15 DIAGNOSIS — I5033 Acute on chronic diastolic (congestive) heart failure: Secondary | ICD-10-CM

## 2015-04-15 LAB — COMPREHENSIVE METABOLIC PANEL
ALT: 29 U/L (ref 14–54)
AST: 27 U/L (ref 15–41)
Albumin: 3 g/dL — ABNORMAL LOW (ref 3.5–5.0)
Alkaline Phosphatase: 111 U/L (ref 38–126)
Anion gap: 8 (ref 5–15)
BILIRUBIN TOTAL: 0.2 mg/dL — AB (ref 0.3–1.2)
BUN: 11 mg/dL (ref 6–20)
CHLORIDE: 104 mmol/L (ref 101–111)
CO2: 25 mmol/L (ref 22–32)
CREATININE: 1.06 mg/dL — AB (ref 0.44–1.00)
Calcium: 9.6 mg/dL (ref 8.9–10.3)
GFR, EST NON AFRICAN AMERICAN: 55 mL/min — AB (ref >60)
Glucose, Bld: 130 mg/dL — ABNORMAL HIGH (ref 65–99)
POTASSIUM: 3.8 mmol/L (ref 3.5–5.1)
Sodium: 137 mmol/L (ref 135–145)
TOTAL PROTEIN: 5.9 g/dL — AB (ref 6.5–8.1)

## 2015-04-15 LAB — TROPONIN I: TROPONIN I: 0.03 ng/mL (ref <0.031)

## 2015-04-15 LAB — CBC WITH DIFFERENTIAL/PLATELET
Basophils Absolute: 0 10*3/uL (ref 0.0–0.1)
Basophils Relative: 0 %
EOS PCT: 5 %
Eosinophils Absolute: 0.5 10*3/uL (ref 0.0–0.7)
HEMATOCRIT: 34 % — AB (ref 36.0–46.0)
Hemoglobin: 11.4 g/dL — ABNORMAL LOW (ref 12.0–15.0)
LYMPHS ABS: 2.1 10*3/uL (ref 0.7–4.0)
LYMPHS PCT: 21 %
MCH: 28.4 pg (ref 26.0–34.0)
MCHC: 33.5 g/dL (ref 30.0–36.0)
MCV: 84.6 fL (ref 78.0–100.0)
MONO ABS: 0.6 10*3/uL (ref 0.1–1.0)
MONOS PCT: 6 %
NEUTROS ABS: 6.8 10*3/uL (ref 1.7–7.7)
Neutrophils Relative %: 68 %
PLATELETS: 226 10*3/uL (ref 150–400)
RBC: 4.02 MIL/uL (ref 3.87–5.11)
RDW: 16.4 % — AB (ref 11.5–15.5)
WBC: 10.1 10*3/uL (ref 4.0–10.5)

## 2015-04-15 LAB — MRSA PCR SCREENING: MRSA by PCR: NEGATIVE

## 2015-04-15 LAB — D-DIMER, QUANTITATIVE: D-Dimer, Quant: 1.11 ug/mL-FEU — ABNORMAL HIGH (ref 0.00–0.48)

## 2015-04-15 LAB — PROTIME-INR
INR: 1.2 (ref 0.00–1.49)
PROTHROMBIN TIME: 15.3 s — AB (ref 11.6–15.2)

## 2015-04-15 MED ORDER — INFLUENZA VAC SPLIT QUAD 0.5 ML IM SUSY: 0.5000 mL | PREFILLED_SYRINGE | INTRAMUSCULAR | Status: DC

## 2015-04-15 MED ORDER — TICAGRELOR 90 MG PO TABS
90.0000 mg | ORAL_TABLET | Freq: Two times a day (BID) | ORAL | Status: DC
  Administered 2015-04-15 (×2): 90 mg via ORAL
  Filled 2015-04-15 (×2): qty 1

## 2015-04-15 MED ORDER — FUROSEMIDE 40 MG PO TABS: 40.0000 mg | ORAL_TABLET | Freq: Every day | ORAL | Status: DC

## 2015-04-15 MED ORDER — IOHEXOL 350 MG/ML SOLN
80.0000 mL | Freq: Once | INTRAVENOUS | Status: AC | PRN
  Administered 2015-04-15: 80 mL via INTRAVENOUS

## 2015-04-15 MED ORDER — ROSUVASTATIN CALCIUM 10 MG PO TABS
10.0000 mg | ORAL_TABLET | Freq: Every evening | ORAL | Status: DC
  Administered 2015-04-15: 10 mg via ORAL
  Filled 2015-04-15: qty 1

## 2015-04-15 MED ORDER — OXYCODONE-ACETAMINOPHEN 5-325 MG PO TABS
1.0000 | ORAL_TABLET | Freq: Four times a day (QID) | ORAL | Status: DC | PRN
  Administered 2015-04-15: 1 via ORAL
  Administered 2015-04-15: 2 via ORAL
  Filled 2015-04-15: qty 2
  Filled 2015-04-15: qty 1

## 2015-04-15 MED ORDER — OXYCODONE HCL 5 MG PO TABS
5.0000 mg | ORAL_TABLET | Freq: Four times a day (QID) | ORAL | Status: DC | PRN
  Administered 2015-04-15 (×2): 5 mg via ORAL
  Filled 2015-04-15 (×2): qty 1

## 2015-04-15 NOTE — H&P (Signed)
[facility] History and Physical  Patient: Catherine Craig  MRN: [id number]  DOB: 1956/03/24  DOS: the patient was seen and examined on 04/14/2015 PCP: Nyoka Cowden, MD  Referring physician: Dr. Darl Householder Chief Complaint: Shortness of breath on exertion  HPI: Catherine Craig is a 59 y.o. female with Past medical history of coronary artery disease recent STEMI, ischemic cardiac myopathy, essential hypertension, dyslipidemia, GERD. The patient is presenting with compressive dyspnea on exertion. Assessment ongoing for last 3-4 days. He has also noted increasing swelling of his right leg. She denies any chest pain or chest tightness. She mentions with her prior to any she did not have any chest pain or chest tightness. Denies having any cough or fever or chills.  she mentions she had injury to her right knee a few days ago. She denies having any diarrhea or constipation or nausea and vomiting active bleeding.  The patient is coming from home.  At her baseline ambulates without support And is independent for most of her ADL; manages her medication on her own.  Review of Systems: as mentioned in the history of present illness.  A comprehensive review of the other systems is negative.  Past Medical History  Diagnosis Date  . ADHD   . Allergic rhinitis   . Basal cell carcinoma   . Fibromyalgia   . GERD   . Dyslipidemia   . Essential hypertension   . MENOPAUSAL SYNDROME 12/15/2006  . Osteoarthritis   . Renal colic 5/73/2202  . VAGINITIS, ATROPHIC 01/05/2007  . Histoplasmosis with retinitis   . Anxiety   . IBS (irritable bowel syndrome)   . Anal fissure   . Thoracic outlet syndrome   . Lipoma     on chest  . CAD (coronary artery disease)     a. Inferior STEMI -  initial cath 02/11/15 revealed 80% mid LAD, 70% D1, chronically occluded mid LCX, 50% mid RCA, 70% distal RCA, acute occlusion of prox RCA s/p DES to prox RCA. Planned re-cath 02/16/15 s/p DES to mLAD, DES to distal  RCA. Med rx for chronically occluded mLCx.  . Cardiogenic shock ([place])     a. 01/2015 in setting of inferior STEMI, possibly 2/2 cardiogenic shock.  . NSVT (nonsustained ventricular tachycardia) ([place])     a. 6 beats 01/2015 after STEMI in setting of low K, Mg.  . Tobacco abuse   . Normocytic anemia   . Valvular heart disease     a. Mild AI/MR, mod TR by echo 02/12/15.  Marland Kitchen Hypokalemia   . Hypomagnesemia   . Leukocytosis   . Acute kidney injury ([place])   . Chronic diastolic CHF (congestive heart failure) ([place])   . RBBB    Past Surgical History  Procedure Laterality Date  . Appendectomy  1973  . Tonsillectomy  1980  . Dilation and curettage of uterus  1982  . Tonsillectomy    . Eye surgery      Lazer to right x 6  . Spine surgery  04/16/2012  . Cardiac catheterization N/A 02/11/2015    Procedure: Left Heart Cath and Coronary Angiography;  Surgeon: Belva Crome, MD;  Location: [place] [facility];  Service: Cardiovascular;  Laterality: N/A;  . Cardiac catheterization N/A 02/11/2015    Procedure: Coronary Stent Intervention;  Surgeon: Belva Crome, MD;  Location: [place] [facility];  Service: Cardiovascular;  Laterality: N/A;  . Cardiac catheterization N/A 02/16/2015    Procedure: Coronary Stent Intervention;  Surgeon: Peter M Martinique, MD;  Location: [place] [facility];  Service: Cardiovascular;  Laterality: N/A;  . Coronary stent placement     Social History:  reports that she has been smoking Cigarettes.  She has a 15 pack-year smoking history. She has never used smokeless tobacco. She reports that she drinks alcohol. She reports that she does not use illicit drugs.  Allergies  Allergen Reactions  . Adderall [Amphetamine-Dextroamphetamine] Other (See Comments)    High heart rate, stops taking for surgeries  . Bee Venom Swelling  . Fluorescein Hives  . Lyrica [Pregabalin] Swelling  . Chantix [Varenicline Tartrate] Nausea And Vomiting    Family History  Problem Relation Age of Onset  .  COPD Mother   . Vascular Disease Mother   . ADD / ADHD Brother   . CAD Father   . Crohn's disease Daughter   . Heart attack Paternal Grandfather 55  . Stroke Other     1st degree relative <60    Prior to Admission medications   Medication Sig Start Date End Date Taking? Authorizing Provider  aspirin EC 81 MG tablet Take 1 tablet (81 mg total) by mouth daily. 02/17/15  Yes Dayna N Dunn, PA-C  B Complex-Folic Acid (SUPER B COMPLEX MAXI PO) Take 1 capsule by mouth at bedtime.    Yes Historical Provider, MD  Cholecalciferol (VITAMIN D PO) Take 1 tablet by mouth 2 (two) times daily.   Yes Historical Provider, MD  Coenzyme Q10 (COQ-10) 200 MG CAPS Take 1 tablet by mouth 2 (two) times daily.   Yes Historical Provider, MD  cyclobenzaprine (FLEXERIL) 10 MG tablet Take 10 mg by mouth 3 (three) times daily as needed for muscle spasms (also 20 mg at night as needed).    Yes Historical Provider, MD  furosemide (LASIX) 20 MG tablet Take 1 tablet (20 mg total) by mouth daily. 03/21/15  Yes Dayna N Dunn, PA-C  gabapentin (NEURONTIN) 400 MG capsule Take 400 mg by mouth at bedtime.    Yes Historical Provider, MD  magnesium oxide (MAG-OX) 400 (241.3 MG) MG tablet Take 1 tablet (400 mg total) by mouth daily. 02/17/15  Yes Dayna N Dunn, PA-C  metoprolol tartrate (LOPRESSOR) 50 MG tablet Take 1 tablet (50 mg total) by mouth 2 (two) times daily. 02/17/15  Yes Dayna N Dunn, PA-C  Multiple Vitamin (MULTIVITAMIN WITH MINERALS) TABS Take 0.5 tablets by mouth 2 (two) times daily.   Yes Historical Provider, MD  nitroGLYCERIN (NITROSTAT) 0.4 MG SL tablet Place 1 tablet (0.4 mg total) under the tongue every 5 (five) minutes as needed for chest pain (up to 3 doses). 02/17/15  Yes Dayna N Dunn, PA-C  oxyCODONE-acetaminophen (PERCOCET) 10-325 MG tablet Take 1-2 tablets by mouth every 6 (six) hours as needed. pain 03/07/15  Yes Historical Provider, MD  potassium chloride SA (K-DUR,KLOR-CON) 20 MEQ tablet Take 1 tablet (20 mEq total) by  mouth daily. 03/20/15  Yes Dayna N Dunn, PA-C  rosuvastatin (CRESTOR) 10 MG tablet Take 1 tablet (10 mg total) by mouth every evening. 04/04/15  Yes Belva Crome, MD  sodium chloride (OCEAN) 0.65 % SOLN nasal spray Place 1 spray into both nostrils daily as needed for congestion.   Yes Historical Provider, MD  ticagrelor (BRILINTA) 90 MG TABS tablet Take 1 tablet (90 mg total) by mouth 2 (two) times daily. 02/17/15  Yes Dayna N Dunn, PA-C  traMADol (ULTRAM) 50 MG tablet Take 50 mg by mouth every 6 (six) hours as needed (pain).   Yes Historical Provider,  MD  vitamin C (ASCORBIC ACID) 500 MG tablet Take 500 mg by mouth 2 (two) times daily.   Yes Historical Provider, MD    Physical Exam: Filed Vitals:   04/15/15 0015 04/15/15 0030 04/15/15 0104 04/15/15 0500  BP: 132/87 134/81 150/77 105/72  Pulse: 83 91 90 74  Temp:   98.1 F (36.7 C) 98 F (36.7 C)  TempSrc:   Oral Oral  Resp: 17 17 18 18   Height:   5\' 3"  (1.6 m)   Weight:   68.675 kg (151 lb 6.4 oz)   SpO2: 100% 99% 99% 99%    General: Alert, Awake and Oriented to Time, Place and Person. Appear in mild distress Eyes: PERRL ENT: Oral Mucosa clear moist. Neck: no JVD Cardiovascular: S1 and S2 Present, no Murmur, Peripheral Pulses Present Respiratory: Bilateral Air entry equal and Decreased,  Clear to Auscultation, no Crackles, no wheezes Abdomen: Bowel Sound present, Soft and no tenderness Skin: no Rash Extremities: Right Pedal edema, no calf tenderness Neurologic: Grossly no focal neuro deficit.  Labs on Admission:  CBC:  Recent Labs Lab 04/14/15 2045 04/15/15 0204  WBC 11.0* 10.1  NEUTROABS  --  6.8  HGB 12.5 11.4*  HCT 38.2 34.0*  MCV 85.3 84.6  PLT 271 226    CMP     Component Value Date/Time   NA 137 04/15/2015 0204   K 3.8 04/15/2015 0204   CL 104 04/15/2015 0204   CO2 25 04/15/2015 0204   GLUCOSE 130* 04/15/2015 0204   GLUCOSE 100* 05/26/2006 0916   BUN 11 04/15/2015 0204   CREATININE 1.06* 04/15/2015  0204   CREATININE 1.04* 03/20/2015 1134   CALCIUM 9.6 04/15/2015 0204   PROT 5.9* 04/15/2015 0204   ALBUMIN 3.0* 04/15/2015 0204   AST 27 04/15/2015 0204   ALT 29 04/15/2015 0204   ALKPHOS 111 04/15/2015 0204   BILITOT 0.2* 04/15/2015 0204   GFRNONAA 55* 04/15/2015 0204   GFRAA >60 04/15/2015 0204     Recent Labs Lab 04/15/15 0204  TROPONINI 0.03   BNP (last 3 results)  Recent Labs  02/11/15 0610 03/16/15 0648 04/14/15 2138  BNP 73.5 443.7* 382.8*    ProBNP (last 3 results) No results for input(s): PROBNP in the last 8760 hours.   Radiological Exams on Admission: Dg Chest 2 View  04/14/2015  CLINICAL DATA:  Shortness of breath for several days EXAM: CHEST  2 VIEW COMPARISON:  March 16, 2015 FINDINGS: There is no edema or consolidation. The heart size and pulmonary vascularity are normal. No adenopathy. There is a left anterior descending coronary artery stent. There is postoperative change in the mid and lower thoracic as well as visualized upper lumbar spine. IMPRESSION: No edema or consolidation. Electronically Signed   By: Lowella Grip III M.D.   On: 04/14/2015 21:18   EKG: Independently reviewed. normal sinus rhythm, nonspecific ST and T waves changes.  Assessment/Plan 1. DOE (dyspnea on exertion) Patient is presenting with numbness of dyspnea on exertion. In the setting off for significant coronary disease as well as no prior complaints of chest pain with a recent STEMI patient will be admitted in the hospital for possible ACS rule out as DOE can be angina equivalent. With his follow serial troponin check d-dimer. She was discussed with cardiology would also be following up with the patient as well. Continuing home medications at present. Monitor on telemetry. Nothing by mouth after midnight.  2.Dyslipidemia Continuing home medications.  3  Essential hypertension Continuing home  medications at present.  4  GERD Continue PPI.  6  CAD in native  artery 7  Chronic combined systolic and diastolic CHF (congestive heart failure) ([place]) Continuing home medications.  8  Pedal edema Check Doppler to rule out DVT.  Nutrition: Nothing by mouth after midnight DVT Prophylaxis: subcutaneous Heparin  Advance goals of care discussion: Full code   Consults: Cardiology by ER  Disposition: Admitted as observation telemetry unit.  Author: Berle Mull, MD [facility] Pager: [phone] 04/14/2015  If 7PM-7AM, please contact night-coverage [email or web address] Password [email or web address]

## 2015-04-15 NOTE — Progress Notes (Deleted)
[facility] PROGRESS NOTE   Catherine Craig:[id number] DOB: Dec 10, 1955 DOA: 04/14/2015 PCP: Nyoka Cowden, MD  HPI/Subjective: Denies any shortness of breath at rest, no orthopnea. Denies any other complaints no chest pain, cough or fever.  Assessment/Plan: Principal Problem:   DOE (dyspnea on exertion) Active Problems:   Dyslipidemia   Essential hypertension   GERD   CAD in native artery   Chronic diastolic CHF (congestive heart failure) (HCC)   Pedal edema   DOE (dyspnea on exertion) Patient is presenting with SOB on exertion DOE is considered angina equivalent in settings of CAD CT angiography showed no PE, mild emphysematous changes, diffuse coronary calcification. Continue telemetry, cardiac enzymes negative, slightly elevated EKG. No clinical evidence of fluid overload. Cardiology consulted.  Dyslipidemia Continuing home medications.  Essential hypertension Continuing home medications at present.  GERD Continue PPI.  CAD in native artery  Chronic diastolic CHF (congestive heart failure) ([place]) 2-D echo in 02/12/2015 showed LVEF of 55-60% with grade 2 diastolic dysfunction. Home medications continued.  Pedal edema Check Doppler to rule out DVT.  Code Status: Full Code Family Communication: Plan discussed with the patient. Disposition Plan: Remains inpatient Diet: Diet Heart Room service appropriate?: Yes; Fluid consistency:: Thin  Consultants:  Cardiology  Procedures:  None  Antibiotics:  None (indicate start date, and stop date if known)   Objective: Filed Vitals:   04/15/15 1145  BP: 134/79  Pulse: 83  Temp: 98 F (36.7 C)  Resp: 18   No intake or output data in the 24 hours ending 04/15/15 1152 Filed Weights   04/14/15 2046 04/15/15 0104  Weight: 69.854 kg (154 lb) 68.675 kg (151 lb 6.4 oz)    Exam: General: Alert and awake, oriented x3, not in any acute distress. HEENT: anicteric sclera, pupils reactive to  light and accommodation, EOMI CVS: S1-S2 clear, no murmur rubs or gallops Chest: clear to auscultation bilaterally, no wheezing, rales or rhonchi Abdomen: soft nontender, nondistended, normal bowel sounds, no organomegaly Extremities: no cyanosis, clubbing or edema noted bilaterally Neuro: Cranial nerves II-XII intact, no focal neurological deficits  Data Reviewed: Basic Metabolic Panel:  Recent Labs Lab 04/14/15 2045 04/15/15 0204  NA 142 137  K 4.7 3.8  CL 106 104  CO2 26 25  GLUCOSE 107* 130*  BUN 13 11  CREATININE 1.07* 1.06*  CALCIUM 10.4* 9.6   Liver Function Tests:  Recent Labs Lab 04/15/15 0204  AST 27  ALT 29  ALKPHOS 111  BILITOT 0.2*  PROT 5.9*  ALBUMIN 3.0*   No results for input(s): LIPASE, AMYLASE in the last 168 hours. No results for input(s): AMMONIA in the last 168 hours. CBC:  Recent Labs Lab 04/14/15 2045 04/15/15 0204  WBC 11.0* 10.1  NEUTROABS  --  6.8  HGB 12.5 11.4*  HCT 38.2 34.0*  MCV 85.3 84.6  PLT 271 226   Cardiac Enzymes:  Recent Labs Lab 04/15/15 0204  TROPONINI 0.03   BNP (last 3 results)  Recent Labs  02/11/15 0610 03/16/15 0648 04/14/15 2138  BNP 73.5 443.7* 382.8*    ProBNP (last 3 results) No results for input(s): PROBNP in the last 8760 hours.  CBG: No results for input(s): GLUCAP in the last 168 hours.  Micro Recent Results (from the past 240 hour(s))  MRSA PCR Screening     Status: None   Collection Time: 04/15/15  1:27 AM  Result Value Ref Range Status   MRSA by PCR NEGATIVE NEGATIVE Final    Comment:  The GeneXpert MRSA Assay (FDA approved for NASAL specimens only), is one component of a comprehensive MRSA colonization surveillance program. It is not intended to diagnose MRSA infection nor to guide or monitor treatment for MRSA infections.      Studies: Dg Chest 2 View  04/14/2015  CLINICAL DATA:  Shortness of breath for several days EXAM: CHEST  2 VIEW COMPARISON:  March 16, 2015 FINDINGS: There is no edema or consolidation. The heart size and pulmonary vascularity are normal. No adenopathy. There is a left anterior descending coronary artery stent. There is postoperative change in the mid and lower thoracic as well as visualized upper lumbar spine. IMPRESSION: No edema or consolidation. Electronically Signed   By: Lowella Grip III M.D.   On: 04/14/2015 21:18   Ct Angio Chest Pe W/cm &/or Wo Cm  04/15/2015  CLINICAL DATA:  Acute onset of exertional shortness of breath. Positive D-dimer. Initial encounter. EXAM: CT ANGIOGRAPHY CHEST WITH CONTRAST TECHNIQUE: Multidetector CT imaging of the chest was performed using the standard protocol during bolus administration of intravenous contrast. Multiplanar CT image reconstructions and MIPs were obtained to evaluate the vascular anatomy. CONTRAST:  51mL OMNIPAQUE IOHEXOL 350 MG/ML SOLN COMPARISON:  Chest radiograph performed 04/14/2015 FINDINGS: There is no evidence of pulmonary embolus. Scattered emphysematous change is noted at the lung apices, with mild peripheral scarring. Minimal bibasilar atelectasis is noted. The lungs are otherwise clear. There is no evidence of pleural effusion or pneumothorax. No masses are identified; no abnormal focal contrast enhancement is seen. Diffuse coronary artery calcifications are seen. The mediastinum is otherwise unremarkable. No mediastinal lymphadenopathy is seen. No pericardial effusion is identified. The great vessels are grossly unremarkable in appearance. No axillary lymphadenopathy is seen. The thyroid gland is unremarkable in appearance. The visualized portions of the liver are unremarkable. A calcified granuloma is noted within the spleen. The visualized portions of the pancreas, stomach, adrenal glands and kidneys are within normal limits. No acute osseous abnormalities are seen. The patient is status post thoracolumbar spinal fusion, partially imaged on this study. Review of the MIP  images confirms the above findings. IMPRESSION: 1. No evidence of pulmonary embolus. 2. Scattered emphysematous change at the lung apices, with mild peripheral scarring. Minimal bibasilar atelectasis noted. Lungs otherwise clear. 3. Diffuse coronary artery calcifications seen. Electronically Signed   By: Garald Balding M.D.   On: 04/15/2015 07:02    Scheduled Meds: . aspirin EC  81 mg Oral Daily  . enoxaparin (LOVENOX) injection  40 mg Subcutaneous Q24H  . furosemide  20 mg Oral Daily  . gabapentin  400 mg Oral QHS  . [START ON 04/16/2015] Influenza vac split quadrivalent PF  0.5 mL Intramuscular Tomorrow-1000  . metoprolol tartrate  50 mg Oral BID  . rosuvastatin  10 mg Oral QPM  . ticagrelor  90 mg Oral BID   Continuous Infusions:      Time spent: 35 minutes    [facility] A  [facility] Pager [phone] If 7PM-7AM, please contact night-coverage at [email or web address], password [facility] 04/15/2015, 11:52 AM

## 2015-04-15 NOTE — Progress Notes (Signed)
Preliminary results by tech - Venous Duplex Lower Ext. Completed. Negative for deep and superficial vein thrombosis in the right and left lower extremities. Oda Cogan, BS, RDMS, RVT

## 2015-04-15 NOTE — Consult Note (Signed)
CONSULTATION NOTE  Reason for Consult: DOE  Requesting Physician: Dr. Hartford Poli  Cardiologist: Dr. Tamala Julian  HPI: This is a 59 y.o. female with a past medical history significant for history of CAD (inferior STEMI 02/2015 s/p DES to prox RCA with staged DES to mLAD & DES to distal RCA, med rx for chronically occluded mLCx), HTN, RBBB, dyslipidemia, tobacco abuse, normocytic anemia, chronic back pain, mild AI/MR and mod TR by echo 01/2015 who presents for follow-up. She was admitted 8/27-9/2 with inferior STEMI with PCI details as above. Hospital course was notable for cardiogenic shock with ? RV infarct, improved, requiring pressors/IABP, hyperglycemia likely 2/2 stress of MI (A1C 5.3), NSVT (6 beats in setting of hypokalemia, hypomagnesemia). F/u echo 02/12/15: EF 60-65%, grade 2 DD, high ventricular filling pressures, mild HK of mid inferoseptal and mid inferior myocardium, mild AI, mild MR, mildly dilated La, mildly reduced RV systolic function, mod TR. She was seen in f/u by Cecilie Kicks NP 02/24/15 at which time she was doing well. She had initially reported some dyspnea and weakness after discharge that seemed to be improving. F/u labs revealed mild renal insufficiency (BUN Cr 29/1.25), Mg 2.5, WBC 16.4 and plt 402 with neg UCx and CXR with mild bronchitic changes. She was seen in the ER 03/16/15 complaining of feeling a bounding pulse in her neck, headache, and dyspnea. She received a neb and felt her heart was going fast. Symptoms were felt different than her prior MI. She received a dose of IV Lasix and her breathing felt back to normal. Labs were notable for WBC 11.7, Cr 1.01, Hgb norma at 12.0, glucose 104, BNP 443, troponin negative - suspect presentation c/w acute diastolic CHF. She was instructed to take Lasix 4m daily until follow-up, however, she was only noted to be taking 20 mg daily when she saw DRae Roam PA-C in follow-up. At that time, her lower extremity edema had resolved and she  was felt to be euvolemic. Her office weight was 145 pounds, down from 160 pounds on September 29th. She subsequently called heart care on 10/28 with increasing shortness of breath, lower extremity edema (however this was unilateral and apparently she had a dog bite) and she was urged to come to either urgent care or the emergency room. On admission she noted mostly right lower extremity swelling, denied chest pain or pressure and reported several days of shortness of breath. Weight in the emergency department now is again up to 151 pounds. Upon questioning her, it seems that she likes to add salt to her meals and she was never told that she had to avoid salt intake. She had 2 sets of cardiac enzymes overnight which were negative and a BNP which was elevated at 382. D-dimer was elevated at 1.11. CT pulmonary angiogram was negative for PE and did not show any evidence of significant volume overload. Her last echo on 02/12/2015 showed an EF of 60-65% with stage II diastolic dysfunction and high ventricular filling pressures. Cardiology is asked to consult regarding shortness of breath.  PMHx:  Past Medical History  Diagnosis Date  . ADHD   . Allergic rhinitis   . Basal cell carcinoma   . Fibromyalgia   . GERD   . Dyslipidemia   . Essential hypertension   . MENOPAUSAL SYNDROME 12/15/2006  . Osteoarthritis   . Renal colic 11/85/6314 . VAGINITIS, ATROPHIC 01/05/2007  . Histoplasmosis with retinitis   . Anxiety   . IBS (irritable bowel syndrome)   .  Anal fissure   . Thoracic outlet syndrome   . Lipoma     on chest  . CAD (coronary artery disease)     a. Inferior STEMI -  initial cath 02/11/15 revealed 80% mid LAD, 70% D1, chronically occluded mid LCX, 50% mid RCA, 70% distal RCA, acute occlusion of prox RCA s/p DES to prox RCA. Planned re-cath 02/16/15 s/p DES to mLAD, DES to distal RCA. Med rx for chronically occluded mLCx.  . Cardiogenic shock ([place])     a. 01/2015 in setting of inferior STEMI,  possibly 2/2 cardiogenic shock.  . NSVT (nonsustained ventricular tachycardia) ([place])     a. 6 beats 01/2015 after STEMI in setting of low K, Mg.  . Tobacco abuse   . Normocytic anemia   . Valvular heart disease     a. Mild AI/MR, mod TR by echo 02/12/15.  Marland Kitchen Hypokalemia   . Hypomagnesemia   . Leukocytosis   . Acute kidney injury ([place])   . Chronic diastolic CHF (congestive heart failure) ([place])   . RBBB    Past Surgical History  Procedure Laterality Date  . Appendectomy  1973  . Tonsillectomy  1980  . Dilation and curettage of uterus  1982  . Tonsillectomy    . Eye surgery      Lazer to right x 6  . Spine surgery  04/16/2012  . Cardiac catheterization N/A 02/11/2015    Procedure: Left Heart Cath and Coronary Angiography;  Surgeon: Belva Crome, MD;  Location: [facility];  Service: Cardiovascular;  Laterality: N/A;  . Cardiac catheterization N/A 02/11/2015    Procedure: Coronary Stent Intervention;  Surgeon: Belva Crome, MD;  Location: [facility];  Service: Cardiovascular;  Laterality: N/A;  . Cardiac catheterization N/A 02/16/2015    Procedure: Coronary Stent Intervention;  Surgeon: Peter M Martinique, MD;  Location: [facility];  Service: Cardiovascular;  Laterality: N/A;  . Coronary stent placement      FAMHx: Family History  Problem Relation Age of Onset  . COPD Mother   . Vascular Disease Mother   . ADD / ADHD Brother   . CAD Father   . Crohn's disease Daughter   . Heart attack Paternal Grandfather 27  . Stroke Other     1st degree relative <60    SOCHx:  reports that she has been smoking Cigarettes.  She has a 15 pack-year smoking history. She has never used smokeless tobacco. She reports that she drinks alcohol. She reports that she does not use illicit drugs.  ALLERGIES: Allergies  Allergen Reactions  . Adderall [Amphetamine-Dextroamphetamine] Other (See Comments)    High heart rate, stops taking for surgeries  . Bee Venom Swelling  .  Fluorescein Hives  . Lyrica [Pregabalin] Swelling  . Chantix [Varenicline Tartrate] Nausea And Vomiting    ROS: A comprehensive review of systems was negative except for: Respiratory: positive for dyspnea on exertion Integument/breast: positive for Dog bite  HOME MEDICATIONS:   Medication List    ASK your doctor about these medications        aspirin EC 81 MG tablet  Take 1 tablet (81 mg total) by mouth daily.     CoQ-10 200 MG Caps  Take 1 tablet by mouth 2 (two) times daily.     cyclobenzaprine 10 MG tablet  Commonly known as:  FLEXERIL  Take 10 mg by mouth 3 (three) times daily as needed for muscle spasms (also 20 mg at night  as needed).     furosemide 20 MG tablet  Commonly known as:  LASIX  Take 1 tablet (20 mg total) by mouth daily.     gabapentin 400 MG capsule  Commonly known as:  NEURONTIN  Take 400 mg by mouth at bedtime.     magnesium oxide 400 (241.3 MG) MG tablet  Commonly known as:  MAG-OX  Take 1 tablet (400 mg total) by mouth daily.     metoprolol 50 MG tablet  Commonly known as:  LOPRESSOR  Take 1 tablet (50 mg total) by mouth 2 (two) times daily.     multivitamin with minerals Tabs tablet  Take 0.5 tablets by mouth 2 (two) times daily.     nitroGLYCERIN 0.4 MG SL tablet  Commonly known as:  NITROSTAT  Place 1 tablet (0.4 mg total) under the tongue every 5 (five) minutes as needed for chest pain (up to 3 doses).     PERCOCET 10-325 MG tablet  Generic drug:  oxyCODONE-acetaminophen  Take 1-2 tablets by mouth every 6 (six) hours as needed. pain     potassium chloride SA 20 MEQ tablet  Commonly known as:  K-DUR,KLOR-CON  Take 1 tablet (20 mEq total) by mouth daily.     rosuvastatin 10 MG tablet  Commonly known as:  CRESTOR  Take 1 tablet (10 mg total) by mouth every evening.     sodium chloride 0.65 % Soln nasal spray  Commonly known as:  OCEAN  Place 1 spray into both nostrils daily as needed for congestion.     SUPER B COMPLEX MAXI PO    Take 1 capsule by mouth at bedtime.     ticagrelor 90 MG Tabs tablet  Commonly known as:  BRILINTA  Take 1 tablet (90 mg total) by mouth 2 (two) times daily.     traMADol 50 MG tablet  Commonly known as:  ULTRAM  Take 50 mg by mouth every 6 (six) hours as needed (pain).     vitamin C 500 MG tablet  Commonly known as:  ASCORBIC ACID  Take 500 mg by mouth 2 (two) times daily.     VITAMIN D PO  Take 1 tablet by mouth 2 (two) times daily.        HOSPITAL MEDICATIONS: I have reviewed the patient's current medications.  VITALS: Blood pressure 105/72, pulse 74, temperature 98 F (36.7 C), temperature source Oral, resp. rate 18, height _0  (1.6 m), weight 151 lb 6.4 oz (68.675 kg), SpO2 99 %.  PHYSICAL EXAM: General appearance: alert, no distress and Lying flat Neck: JVD - 2 cm above sternal notch and no carotid bruit Lungs: diminished breath sounds bibasilar Heart: regular rate and rhythm, S1, S2 normal and systolic murmur: early systolic 2/6, crescendo at apex Abdomen: soft, non-tender; bowel sounds normal; no masses,  no organomegaly Extremities: extremities normal, atraumatic, no cyanosis or edema Pulses: 2+ and symmetric Skin: Skin color, texture, turgor normal. No rashes or lesions or Excoriations/puncture wounds on right shin Neurologic: Grossly normal Psych: Pleasant  LABS: Results for orders placed or performed during the hospital encounter of 04/14/15 (from the past 48 hour(s))  Basic metabolic panel     Status: Abnormal   Collection Time: 04/14/15  8:45 PM  Result Value Ref Range   Sodium 142 135 - 145 mmol/L   Potassium 4.7 3.5 - 5.1 mmol/L   Chloride 106 101 - 111 mmol/L   CO2 26 22 - 32 mmol/L   Glucose, Bld 107 (H)  65 - 99 mg/dL   BUN 13 6 - 20 mg/dL   Creatinine, Ser 1.07 (H) 0.44 - 1.00 mg/dL   Calcium 10.4 (H) 8.9 - 10.3 mg/dL   GFR calc non Af Amer 55 (L) >60 mL/min   GFR calc Af Amer >60 >60 mL/min    Comment: (NOTE) The eGFR has been calculated  using the CKD EPI equation. This calculation has not been validated in all clinical situations. eGFR's persistently <60 mL/min signify possible Chronic Kidney Disease.    Anion gap 10 5 - 15  CBC     Status: Abnormal   Collection Time: 04/14/15  8:45 PM  Result Value Ref Range   WBC 11.0 (H) 4.0 - 10.5 K/uL   RBC 4.48 3.87 - 5.11 MIL/uL   Hemoglobin 12.5 12.0 - 15.0 g/dL   HCT 38.2 36.0 - 46.0 %   MCV 85.3 78.0 - 100.0 fL   MCH 27.9 26.0 - 34.0 pg   MCHC 32.7 30.0 - 36.0 g/dL   RDW 16.3 (H) 11.5 - 15.5 %   Platelets 271 150 - 400 K/uL  I-stat troponin, ED     Status: None   Collection Time: 04/14/15  8:51 PM  Result Value Ref Range   Troponin i, poc 0.02 0.00 - 0.08 ng/mL   Comment 3            Comment: Due to the release kinetics of cTnI, a negative result within the first hours of the onset of symptoms does not rule out myocardial infarction with certainty. If myocardial infarction is still suspected, repeat the test at appropriate intervals.   Brain natriuretic peptide     Status: Abnormal   Collection Time: 04/14/15  9:38 PM  Result Value Ref Range   B Natriuretic Peptide 382.8 (H) 0.0 - 100.0 pg/mL  MRSA PCR Screening     Status: None   Collection Time: 04/15/15  1:27 AM  Result Value Ref Range   MRSA by PCR NEGATIVE NEGATIVE    Comment:        The GeneXpert MRSA Assay (FDA approved for NASAL specimens only), is one component of a comprehensive MRSA colonization surveillance program. It is not intended to diagnose MRSA infection nor to guide or monitor treatment for MRSA infections.   D-dimer, quantitative (not at [facility])     Status: Abnormal   Collection Time: 04/15/15  2:04 AM  Result Value Ref Range   D-Dimer, Quant 1.11 (H) 0.00 - 0.48 ug/mL-FEU    Comment:        AT THE INHOUSE ESTABLISHED CUTOFF VALUE OF 0.48 ug/mL FEU, THIS ASSAY HAS BEEN DOCUMENTED IN THE LITERATURE TO HAVE A SENSITIVITY AND NEGATIVE PREDICTIVE VALUE OF AT LEAST 98 TO 99%.  THE  TEST RESULT SHOULD BE CORRELATED WITH AN ASSESSMENT OF THE CLINICAL PROBABILITY OF DVT / VTE.   Troponin I (q 6hr x 3)     Status: None   Collection Time: 04/15/15  2:04 AM  Result Value Ref Range   Troponin I 0.03 <0.031 ng/mL    Comment:        NO INDICATION OF MYOCARDIAL INJURY.   CBC with Differential/Platelet     Status: Abnormal   Collection Time: 04/15/15  2:04 AM  Result Value Ref Range   WBC 10.1 4.0 - 10.5 K/uL   RBC 4.02 3.87 - 5.11 MIL/uL   Hemoglobin 11.4 (L) 12.0 - 15.0 g/dL   HCT 34.0 (L) 36.0 - 46.0 %   MCV  84.6 78.0 - 100.0 fL   MCH 28.4 26.0 - 34.0 pg   MCHC 33.5 30.0 - 36.0 g/dL   RDW 16.4 (H) 11.5 - 15.5 %   Platelets 226 150 - 400 K/uL   Neutrophils Relative % 68 %   Neutro Abs 6.8 1.7 - 7.7 K/uL   Lymphocytes Relative 21 %   Lymphs Abs 2.1 0.7 - 4.0 K/uL   Monocytes Relative 6 %   Monocytes Absolute 0.6 0.1 - 1.0 K/uL   Eosinophils Relative 5 %   Eosinophils Absolute 0.5 0.0 - 0.7 K/uL   Basophils Relative 0 %   Basophils Absolute 0.0 0.0 - 0.1 K/uL  Comprehensive metabolic panel     Status: Abnormal   Collection Time: 04/15/15  2:04 AM  Result Value Ref Range   Sodium 137 135 - 145 mmol/L   Potassium 3.8 3.5 - 5.1 mmol/L    Comment: DELTA CHECK NOTED   Chloride 104 101 - 111 mmol/L   CO2 25 22 - 32 mmol/L   Glucose, Bld 130 (H) 65 - 99 mg/dL   BUN 11 6 - 20 mg/dL   Creatinine, Ser 1.06 (H) 0.44 - 1.00 mg/dL   Calcium 9.6 8.9 - 10.3 mg/dL   Total Protein 5.9 (L) 6.5 - 8.1 g/dL   Albumin 3.0 (L) 3.5 - 5.0 g/dL   AST 27 15 - 41 U/L   ALT 29 14 - 54 U/L   Alkaline Phosphatase 111 38 - 126 U/L   Total Bilirubin 0.2 (L) 0.3 - 1.2 mg/dL   GFR calc non Af Amer 55 (L) >60 mL/min   GFR calc Af Amer >60 >60 mL/min    Comment: (NOTE) The eGFR has been calculated using the CKD EPI equation. This calculation has not been validated in all clinical situations. eGFR's persistently <60 mL/min signify possible Chronic Kidney Disease.    Anion gap 8 5 -  15  Protime-INR     Status: Abnormal   Collection Time: 04/15/15  2:04 AM  Result Value Ref Range   Prothrombin Time 15.3 (H) 11.6 - 15.2 seconds   INR 1.20 0.00 - 1.49    IMAGING: Dg Chest 2 View  04/14/2015  CLINICAL DATA:  Shortness of breath for several days EXAM: CHEST  2 VIEW COMPARISON:  March 16, 2015 FINDINGS: There is no edema or consolidation. The heart size and pulmonary vascularity are normal. No adenopathy. There is a left anterior descending coronary artery stent. There is postoperative change in the mid and lower thoracic as well as visualized upper lumbar spine. IMPRESSION: No edema or consolidation. Electronically Signed   By: Lowella Grip III M.D.   On: 04/14/2015 21:18   Ct Angio Chest Pe W/cm &/or Wo Cm  04/15/2015  CLINICAL DATA:  Acute onset of exertional shortness of breath. Positive D-dimer. Initial encounter. EXAM: CT ANGIOGRAPHY CHEST WITH CONTRAST TECHNIQUE: Multidetector CT imaging of the chest was performed using the standard protocol during bolus administration of intravenous contrast. Multiplanar CT image reconstructions and MIPs were obtained to evaluate the vascular anatomy. CONTRAST:  6m OMNIPAQUE IOHEXOL 350 MG/ML SOLN COMPARISON:  Chest radiograph performed 04/14/2015 FINDINGS: There is no evidence of pulmonary embolus. Scattered emphysematous change is noted at the lung apices, with mild peripheral scarring. Minimal bibasilar atelectasis is noted. The lungs are otherwise clear. There is no evidence of pleural effusion or pneumothorax. No masses are identified; no abnormal focal contrast enhancement is seen. Diffuse coronary artery calcifications are seen. The mediastinum is  otherwise unremarkable. No mediastinal lymphadenopathy is seen. No pericardial effusion is identified. The great vessels are grossly unremarkable in appearance. No axillary lymphadenopathy is seen. The thyroid gland is unremarkable in appearance. The visualized portions of the liver  are unremarkable. A calcified granuloma is noted within the spleen. The visualized portions of the pancreas, stomach, adrenal glands and kidneys are within normal limits. No acute osseous abnormalities are seen. The patient is status post thoracolumbar spinal fusion, partially imaged on this study. Review of the MIP images confirms the above findings. IMPRESSION: 1. No evidence of pulmonary embolus. 2. Scattered emphysematous change at the lung apices, with mild peripheral scarring. Minimal bibasilar atelectasis noted. Lungs otherwise clear. 3. Diffuse coronary artery calcifications seen. Electronically Signed   By: Garald Balding M.D.   On: 04/15/2015 07:02    HOSPITAL DIAGNOSES: Principal Problem:   DOE (dyspnea on exertion) Active Problems:   Dyslipidemia   Essential hypertension   GERD   CAD in native artery   Chronic diastolic CHF (congestive heart failure) ([facility])   Pedal edema   IMPRESSION: 1. Mild acute on chronic diastolic congestive heart failure exacerbation  RECOMMENDATION: 1. Mrs. Deines has had a small amount of weight gain and shortness of breath. Some of her shortness of breath may been due to anxiety related to being "in a dog fight". She was bit on the shin and noticed that she had some swelling but that has resolved today. Her dry weight is around 145 pounds and currently she is 151 pounds. She's been taking half of the recommended dose of Lasix, namely 20 mg daily. I recommend increasing her Lasix to 40 mg daily. She is not watching her salt intake and I strongly advised that she discontinue adding salt to her foods and watch sodium content in the foods that she eats. She says that recommendation would "kill her" as she loves to eat. She can easily lay flat and does not appear dyspneic today. BNP is only mildly elevated. Laboratory work is within normal limits and troponins were negative. I feel that she could be discharged on the increased dose of Lasix with a follow-up in  7-10 days in the cardiology clinic.  Thanks for the consultation.  Time Spent Directly with Patient: 30 minutes  Pixie Casino, MD, [facility] Attending Cardiologist [place] 04/15/2015, 11:34 AM

## 2015-04-15 NOTE — Discharge Summary (Signed)
Physician Discharge Summary  [name] BRA:[id number] DOB: 1956/05/10 DOA: 04/14/2015  PCP: Nyoka Cowden, MD  Admit date: 04/14/2015 Discharge date: 04/15/2015  Time spent: 40 minutes  Recommendations for Outpatient Follow-up:  1. Follow-up with regular physician in one week. 2. Follow-up with Dr. Pernell Dupre in 1 week.  Discharge Diagnoses:  Principal Problem:   DOE (dyspnea on exertion) Active Problems:   Dyslipidemia   Essential hypertension   GERD   CAD in native artery   Chronic diastolic CHF (congestive heart failure) (HCC)   Pedal edema   Discharge Condition: Stable  Diet recommendation: Heart healthy  Filed Weights   04/14/15 2046 04/15/15 0104  Weight: 69.854 kg (154 lb) 68.675 kg (151 lb 6.4 oz)    History of present illness:  Catherine Craig is a 59 y.o. female with Past medical history of coronary artery disease recent STEMI, ischemic cardiac myopathy, essential hypertension, dyslipidemia, GERD. The patient is presenting with compressive dyspnea on exertion. Assessment ongoing for last 3-4 days. He has also noted increasing swelling of his right leg. She denies any chest pain or chest tightness. She mentions with her prior to any she did not have any chest pain or chest tightness. Denies having any cough or fever or chills.  she mentions she had injury to her right knee a few days ago. She denies having any diarrhea or constipation or nausea and vomiting active bleeding.  The patient is coming from home.  At her baseline ambulates without support And is independent for most of her ADL; manages her medication on her own.  Hospital Course:   DOE (dyspnea on exertion) Patient is presenting with SOB on exertion DOE is considered angina equivalent in settings of CAD CT angiography showed no PE, mild emphysematous changes, diffuse coronary calcification. Continue telemetry, cardiac enzymes negative, slightly elevated EKG. Cardiology  consulted and recommended to increase Lasix 40 mg by mouth daily. Cannot also rule out DOE secondary to [employer]. Brilinta just started about 6 weeks ago, after patient received PCI. Cardiology recommended to optimize fluid status before considering switch Brilinta to Effient or Plavix.  Dyslipidemia Continuing home medications.  Essential hypertension Continuing home medications at present.  GERD Continue PPI.  CAD in native artery  Chronic diastolic CHF (congestive heart failure) ([place]) 2-D echo in 02/12/2015 showed LVEF of 55-60% with grade 2 diastolic dysfunction. Home medications continued.  Pedal edema Check Doppler to rule out DVT.   Procedures:  None  Consultations:  Cardiology  Discharge Exam: Filed Vitals:   04/15/15 1145  BP: 134/79  Pulse: 83  Temp: 98 F (36.7 C)  Resp: 18   General: Alert and awake, oriented x3, not in any acute distress. HEENT: anicteric sclera, pupils reactive to light and accommodation, EOMI CVS: S1-S2 clear, no murmur rubs or gallops Chest: clear to auscultation bilaterally, no wheezing, rales or rhonchi Abdomen: soft nontender, nondistended, normal bowel sounds, no organomegaly Extremities: no cyanosis, clubbing or edema noted bilaterally Neuro: Cranial nerves II-XII intact, no focal neurological deficits  Discharge Instructions   Discharge Instructions    Diet - low sodium heart healthy    Complete by:  As directed      Increase activity slowly    Complete by:  As directed           Current Discharge Medication List    CONTINUE these medications which have CHANGED   Details  furosemide (LASIX) 40 MG tablet Take 1 tablet (40 mg total) by mouth daily. Qty: 30 tablet, Refills:  0      CONTINUE these medications which have NOT CHANGED   Details  aspirin EC 81 MG tablet Take 1 tablet (81 mg total) by mouth daily. Qty: 30 tablet, Refills: 11    B Complex-Folic Acid (SUPER B COMPLEX MAXI PO) Take 1 capsule by mouth at  bedtime.     Cholecalciferol (VITAMIN D PO) Take 1 tablet by mouth 2 (two) times daily.    Coenzyme Q10 (COQ-10) 200 MG CAPS Take 1 tablet by mouth 2 (two) times daily.    cyclobenzaprine (FLEXERIL) 10 MG tablet Take 10 mg by mouth 3 (three) times daily as needed for muscle spasms (also 20 mg at night as needed).    Associated Diagnoses: Acute on chronic diastolic (congestive) heart failure (HCC); CAD S/P percutaneous coronary angioplasty; Acute kidney injury ([place]); Essential hypertension; Dyslipidemia; Tobacco abuse; Leukocytosis; Normocytic anemia; RBBB    gabapentin (NEURONTIN) 400 MG capsule Take 400 mg by mouth at bedtime.     magnesium oxide (MAG-OX) 400 (241.3 MG) MG tablet Take 1 tablet (400 mg total) by mouth daily. Qty: 30 tablet, Refills: 6    metoprolol tartrate (LOPRESSOR) 50 MG tablet Take 1 tablet (50 mg total) by mouth 2 (two) times daily. Qty: 60 tablet, Refills: 6    Multiple Vitamin (MULTIVITAMIN WITH MINERALS) TABS Take 0.5 tablets by mouth 2 (two) times daily.    nitroGLYCERIN (NITROSTAT) 0.4 MG SL tablet Place 1 tablet (0.4 mg total) under the tongue every 5 (five) minutes as needed for chest pain (up to 3 doses). Qty: 25 tablet, Refills: 3    oxyCODONE-acetaminophen (PERCOCET) 10-325 MG tablet Take 1-2 tablets by mouth every 6 (six) hours as needed. pain    potassium chloride SA (K-DUR,KLOR-CON) 20 MEQ tablet Take 1 tablet (20 mEq total) by mouth daily.    rosuvastatin (CRESTOR) 10 MG tablet Take 1 tablet (10 mg total) by mouth every evening.    sodium chloride (OCEAN) 0.65 % SOLN nasal spray Place 1 spray into both nostrils daily as needed for congestion.    ticagrelor (BRILINTA) 90 MG TABS tablet Take 1 tablet (90 mg total) by mouth 2 (two) times daily. Qty: 60 tablet, Refills: 11    traMADol (ULTRAM) 50 MG tablet Take 50 mg by mouth every 6 (six) hours as needed (pain).    vitamin C (ASCORBIC ACID) 500 MG tablet Take 500 mg by mouth 2 (two) times daily.        Allergies  Allergen Reactions  . Adderall [Amphetamine-Dextroamphetamine] Other (See Comments)    High heart rate, stops taking for surgeries  . Bee Venom Swelling  . Fluorescein Hives  . Lyrica [Pregabalin] Swelling  . Chantix [Varenicline Tartrate] Nausea And Vomiting   Follow-up Information    Follow up with Sinclair Grooms, MD In 1 week.   Specialty:  Cardiology   Contact information:   [address] [place] [address] [phone]        The results of significant diagnostics from this hospitalization (including imaging, microbiology, ancillary and laboratory) are listed below for reference.    Significant Diagnostic Studies: Dg Chest 2 View  04/14/2015  CLINICAL DATA:  Shortness of breath for several days EXAM: CHEST  2 VIEW COMPARISON:  March 16, 2015 FINDINGS: There is no edema or consolidation. The heart size and pulmonary vascularity are normal. No adenopathy. There is a left anterior descending coronary artery stent. There is postoperative change in the mid and lower thoracic as well as  visualized upper lumbar spine. IMPRESSION: No edema or consolidation. Electronically Signed   By: Lowella Grip III M.D.   On: 04/14/2015 21:18   Ct Angio Chest Pe W/cm &/or Wo Cm  04/15/2015  CLINICAL DATA:  Acute onset of exertional shortness of breath. Positive D-dimer. Initial encounter. EXAM: CT ANGIOGRAPHY CHEST WITH CONTRAST TECHNIQUE: Multidetector CT imaging of the chest was performed using the standard protocol during bolus administration of intravenous contrast. Multiplanar CT image reconstructions and MIPs were obtained to evaluate the vascular anatomy. CONTRAST:  31mL OMNIPAQUE IOHEXOL 350 MG/ML SOLN COMPARISON:  Chest radiograph performed 04/14/2015 FINDINGS: There is no evidence of pulmonary embolus. Scattered emphysematous change is noted at the lung apices, with mild peripheral scarring. Minimal bibasilar atelectasis is noted. The lungs are  otherwise clear. There is no evidence of pleural effusion or pneumothorax. No masses are identified; no abnormal focal contrast enhancement is seen. Diffuse coronary artery calcifications are seen. The mediastinum is otherwise unremarkable. No mediastinal lymphadenopathy is seen. No pericardial effusion is identified. The great vessels are grossly unremarkable in appearance. No axillary lymphadenopathy is seen. The thyroid gland is unremarkable in appearance. The visualized portions of the liver are unremarkable. A calcified granuloma is noted within the spleen. The visualized portions of the pancreas, stomach, adrenal glands and kidneys are within normal limits. No acute osseous abnormalities are seen. The patient is status post thoracolumbar spinal fusion, partially imaged on this study. Review of the MIP images confirms the above findings. IMPRESSION: 1. No evidence of pulmonary embolus. 2. Scattered emphysematous change at the lung apices, with mild peripheral scarring. Minimal bibasilar atelectasis noted. Lungs otherwise clear. 3. Diffuse coronary artery calcifications seen. Electronically Signed   By: Garald Balding M.D.   On: 04/15/2015 07:02    Microbiology: Recent Results (from the past 240 hour(s))  MRSA PCR Screening     Status: None   Collection Time: 04/15/15  1:27 AM  Result Value Ref Range Status   MRSA by PCR NEGATIVE NEGATIVE Final    Comment:        The GeneXpert MRSA Assay (FDA approved for NASAL specimens only), is one component of a comprehensive MRSA colonization surveillance program. It is not intended to diagnose MRSA infection nor to guide or monitor treatment for MRSA infections.      Labs: Basic Metabolic Panel:  Recent Labs Lab 04/14/15 2045 04/15/15 0204  NA 142 137  K 4.7 3.8  CL 106 104  CO2 26 25  GLUCOSE 107* 130*  BUN 13 11  CREATININE 1.07* 1.06*  CALCIUM 10.4* 9.6   Liver Function Tests:  Recent Labs Lab 04/15/15 0204  AST 27  ALT 29   ALKPHOS 111  BILITOT 0.2*  PROT 5.9*  ALBUMIN 3.0*   No results for input(s): LIPASE, AMYLASE in the last 168 hours. No results for input(s): AMMONIA in the last 168 hours. CBC:  Recent Labs Lab 04/14/15 2045 04/15/15 0204  WBC 11.0* 10.1  NEUTROABS  --  6.8  HGB 12.5 11.4*  HCT 38.2 34.0*  MCV 85.3 84.6  PLT 271 226   Cardiac Enzymes:  Recent Labs Lab 04/15/15 0204  TROPONINI 0.03   BNP: BNP (last 3 results)  Recent Labs  02/11/15 0610 03/16/15 0648 04/14/15 2138  BNP 73.5 443.7* 382.8*    ProBNP (last 3 results) No results for input(s): PROBNP in the last 8760 hours.  CBG: No results for input(s): GLUCAP in the last 168 hours.     Signed:  Verlee Monte  A  [facility] 04/15/2015, 1:16 PM

## 2015-04-17 ENCOUNTER — Telehealth: Payer: Self-pay | Admitting: Interventional Cardiology

## 2015-04-17 NOTE — Telephone Encounter (Signed)
Patient requesting refills. Was put in my in basket

## 2015-04-17 NOTE — Telephone Encounter (Signed)
New message  Samples    1. Which medications need to be refilled? Brilanta (samples)   2. Which pharmacy/location is medication to be sent to? Pick up from the office (per pt she has 1 container left so she has about 3 days left)   3. Comments: Pt states that she was advised to call the office if she ran out for samples. Pt states that she has not gotten back in touch with [facility] for more samples. Please assist.

## 2015-04-17 NOTE — Telephone Encounter (Signed)
New message  Pt is calling she states that she was speaking to [place] about forms for the [place]   They said that they did not receive everything and that a form is missing

## 2015-04-17 NOTE — Telephone Encounter (Signed)
Notified patient that we did not have any available samples as of right now, and for her to call back later in the week.

## 2015-04-18 NOTE — Telephone Encounter (Signed)
Spoke with [place] Rep. She states patient has been denied assistance because she has [employer]. She needs to do a letter of appeal to them. i have sent a copy of her BCBS card to them as well. Patient voiced understanding, however she is almost out of samples and we currently have none.

## 2015-04-19 ENCOUNTER — Telehealth: Payer: Self-pay | Admitting: Interventional Cardiology

## 2015-04-19 NOTE — Telephone Encounter (Signed)
Brilinta 90mg   4 bottles of 8 each given to patient per request.

## 2015-04-19 NOTE — Telephone Encounter (Signed)
New message      Patient calling the office for samples of medication:   1.  What medication and dosage are you requesting samples for? brilinta 90mg   2.  Are you currently out of this medication?  2 pills left

## 2015-04-24 ENCOUNTER — Encounter: Payer: Self-pay | Admitting: Interventional Cardiology

## 2015-04-27 ENCOUNTER — Encounter (HOSPITAL_COMMUNITY)
Admission: RE | Admit: 2015-04-27 | Discharge: 2015-04-27 | Disposition: A | Discharge status: Home / Self Care | Payer: BLUE CROSS/BLUE SHIELD | Source: Ambulatory Visit | Attending: Interventional Cardiology | Admitting: Interventional Cardiology

## 2015-04-27 DIAGNOSIS — I213 ST elevation (STEMI) myocardial infarction of unspecified site: Secondary | ICD-10-CM | POA: Insufficient documentation

## 2015-04-27 NOTE — Progress Notes (Signed)
Cardiac Rehab Medication Review by a Pharmacist  Does the patient  feel that his/her medications are working for him/her?  yes  Has the patient been experiencing any side effects to the medications prescribed?  Yes, dyspnea which she attributes possibly to Brilinta and also dizziness  Does the patient measure his/her own blood pressure or blood glucose at home?  no   Does the patient have any problems obtaining medications due to transportation or finances?   Yes (Brilinta and Crestor) but is able to now afford Crestor and is now on samples for Brilinta until receives medication assistance for Brilinta  Understanding of regimen: good Understanding of indications: good Potential of compliance: good    Pharmacist comments:Pt states that has occasional shortness of breath which she states may be attributed to [place].  Pt states has improved. Pt also states that she has frequent dizziness when she gets up quickly.   Pt is interested in stopping smoking and would like to try Chantix (has tried chantix in the past and had n/v but states she was on other medications which may cause that).  Pt states has also tried NRT and Bupropion and has quit 3 times in her life.   04/27/2015 9:00 AM Bennye Alm, PharmD Pharmacy Resident [phone]

## 2015-04-28 ENCOUNTER — Ambulatory Visit (INDEPENDENT_AMBULATORY_CARE_PROVIDER_SITE_OTHER): Payer: BLUE CROSS/BLUE SHIELD | Admitting: Cardiology

## 2015-04-28 ENCOUNTER — Encounter: Payer: Self-pay | Admitting: Cardiology

## 2015-04-28 VITALS — BP 100/68 | HR 69 | Ht 61.0 in | Wt 146.8 lb

## 2015-04-28 DIAGNOSIS — I2119 ST elevation (STEMI) myocardial infarction involving other coronary artery of inferior wall: Secondary | ICD-10-CM

## 2015-04-28 DIAGNOSIS — I5031 Acute diastolic (congestive) heart failure: Secondary | ICD-10-CM

## 2015-04-28 LAB — BASIC METABOLIC PANEL
BUN: 21 mg/dL (ref 7–25)
CALCIUM: 10.7 mg/dL — AB (ref 8.6–10.4)
CHLORIDE: 100 mmol/L (ref 98–110)
CO2: 23 mmol/L (ref 20–31)
Creat: 1.14 mg/dL — ABNORMAL HIGH (ref 0.50–0.99)
GLUCOSE: 91 mg/dL (ref 65–99)
POTASSIUM: 3.9 mmol/L (ref 3.5–5.3)
SODIUM: 137 mmol/L (ref 135–146)

## 2015-04-28 MED ORDER — METOPROLOL SUCCINATE ER 25 MG PO TB24: 37.5000 mg | ORAL_TABLET | Freq: Two times a day (BID) | ORAL | Status: DC

## 2015-04-28 MED ORDER — VARENICLINE TARTRATE 0.5 MG X 11 & 1 MG X 42 PO MISC: ORAL | Status: DC

## 2015-04-28 MED ORDER — METOPROLOL SUCCINATE ER 25 MG PO TB24: 37.5000 mg | ORAL_TABLET | Freq: Every day | ORAL | Status: DC

## 2015-04-28 NOTE — Progress Notes (Signed)
04/28/2015 Catherine Craig   1955/07/25  DO:[id number]  Primary Physician Nyoka Cowden, MD Primary Cardiologist: Dr. Tamala Julian   Reason for Visit/CC: Post ED F/u for A/C Diastolic CHF  HPI:  The patient is a 59 year old female, followed by Dr. Tamala Julian, with a past medical history significant for CAD (inferior STEMI 02/2015 s/p DES to prox RCA with staged DES to mLAD & DES to distal RCA, med rx for chronically occluded mLCx), HTN, RBBB, dyslipidemia, tobacco abuse, normocytic anemia, chronic back pain, mild AI/MR and mod TR by echo 01/2015. EF was 60-65% with grade 2 diastolic dysfunction. She is on dual antiplatelet therapy with aspirin and Brilinta for her recently placed drug-eluting stents.   She has been doing fairly well in regards to her CAD without any recurrent anginal symptoms. However, she was recently seen in the [facility] emergency department 04/15/2015 with complaints of lower extremity edema and mild dyspnea. BNP was abnormal at 382. W/u was suggestive of acute on chronic diastolic CHF. She was instructed to increase her Lasix to 40 mg daily. She was instructed to follow back up in our office for repeat assessment.   Today in clinic, she reports she has done fairly well. She denies any recurrent dyspnea on exertion or lower extremity edema. She continues to have occasional brief periods of mild dyspnea however she contributes this to Brilinta. He denies any orthopnea or PND. No recurrent chest or jaw pain. Her only issue has been dizziness/lightheadness with positional changes and with activity. She denies any syncope.  EKG in clinic today demonstrates normal sinus rhythm with chronic RBBB. No ischemic abnormalities noted. Rate is 69 bpm. Her blood pressure is soft but stable at 100/68.     Current Outpatient Prescriptions  Medication Sig Dispense Refill  . aspirin EC 81 MG tablet Take 1 tablet (81 mg total) by mouth daily. 30 tablet 11  . B Complex-Folic Acid (SUPER B  COMPLEX MAXI PO) Take 1 capsule by mouth at bedtime.     . Cholecalciferol (VITAMIN D PO) Take 2,000 Units by mouth 2 (two) times daily.     . Coenzyme Q10 (COQ-10) 200 MG CAPS Take 1 tablet by mouth 2 (two) times daily.    . cyclobenzaprine (FLEXERIL) 10 MG tablet Take 10 mg by mouth 3 (three) times daily as needed for muscle spasms (also 20 mg at night as needed).     . furosemide (LASIX) 40 MG tablet Take 1 tablet (40 mg total) by mouth daily. 30 tablet 0  . gabapentin (NEURONTIN) 400 MG capsule Take 400 mg by mouth at bedtime.     . magnesium oxide (MAG-OX) 400 (241.3 MG) MG tablet Take 1 tablet (400 mg total) by mouth daily. 30 tablet 6  . metoprolol tartrate (LOPRESSOR) 50 MG tablet Take 1 tablet (50 mg total) by mouth 2 (two) times daily. 60 tablet 6  . Multiple Vitamin (MULTIVITAMIN WITH MINERALS) TABS Take 0.5 tablets by mouth 2 (two) times daily.    . nitroGLYCERIN (NITROSTAT) 0.4 MG SL tablet Place 1 tablet (0.4 mg total) under the tongue every 5 (five) minutes as needed for chest pain (up to 3 doses). 25 tablet 3  . potassium chloride SA (K-DUR,KLOR-CON) 20 MEQ tablet Take 1 tablet (20 mEq total) by mouth daily.    . rosuvastatin (CRESTOR) 10 MG tablet Take 1 tablet (10 mg total) by mouth every evening.    . sodium chloride (OCEAN) 0.65 % SOLN nasal spray Place 1 spray into both nostrils daily  as needed for congestion.    . ticagrelor (BRILINTA) 90 MG TABS tablet Take 1 tablet (90 mg total) by mouth 2 (two) times daily. 60 tablet 11  . traMADol (ULTRAM) 50 MG tablet Take 50 mg by mouth every 6 (six) hours as needed (pain).    . vitamin C (ASCORBIC ACID) 500 MG tablet Take 500 mg by mouth 2 (two) times daily.    . [DISCONTINUED] fluticasone (FLONASE) 50 MCG/ACT nasal spray Place 1 spray into the nose daily. 16 g 2  . [DISCONTINUED] pravastatin (PRAVACHOL) 40 MG tablet Take 1 tablet (40 mg total) by mouth every evening. 90 tablet 3   No current facility-administered medications for this  visit.    Allergies  Allergen Reactions  . Adderall [Amphetamine-Dextroamphetamine] Other (See Comments)    High heart rate, stops taking for surgeries  . Bee Venom Swelling  . Fluorescein Hives  . Lyrica [Pregabalin] Swelling  . Chantix [Varenicline Tartrate] Nausea And Vomiting    Social History   Social History  . Marital Status: Married    Spouse Name: N/A  . Number of Children: N/A  . Years of Education: N/A   Occupational History  . Not on file.   Social History Main Topics  . Smoking status: Current Some Day Smoker -- 0.50 packs/day for 30 years    Types: Cigarettes    Last Attempt to Quit: 02/24/2015  . Smokeless tobacco: Never Used     Comment: using electronic cigarette as of 03/2012  . Alcohol Use: 0.0 oz/week    0 Standard drinks or equivalent per week     Comment: social  . Drug Use: No  . Sexual Activity: Not on file   Other Topics Concern  . Not on file   Social History Narrative     Review of Systems: General: negative for chills, fever, night sweats or weight changes.  Cardiovascular: negative for chest pain, dyspnea on exertion, edema, orthopnea, palpitations, paroxysmal nocturnal dyspnea or shortness of breath Dermatological: negative for rash Respiratory: negative for cough or wheezing Urologic: negative for hematuria Abdominal: negative for nausea, vomiting, diarrhea, bright red blood per rectum, melena, or hematemesis Neurologic: negative for visual changes, syncope, or dizziness All other systems reviewed and are otherwise negative except as noted above.    Blood pressure 100/68, pulse 69, height 5\' 1"  (1.549 m), weight 146 lb 12.8 oz (66.588 kg).  General appearance: alert, cooperative and no distress Neck: no carotid bruit and no JVD Lungs: clear to auscultation bilaterally Heart: regular rate and rhythm, S1, S2 normal, no murmur, click, rub or gallop Extremities: no LEE Pulses: 2+ and symmetric Skin: warm and dry Neurologic:  Grossly normal  EKG NSR with chronic RBBB. 69 bpm   ASSESSMENT AND PLAN:   1. CAD: s/p Inferior STEMI treated with PCI + DES to proximal and distal RCA. Also with staged PCI + DES to mid LAD; med rx for chronically occluded mLCx. EKG today demonstrates no ischemic abnormalities. She denies any recurrent anginal symptoms. Continue dual antiplatelet therapy with aspirin plus Brilinta. Statin therapy with Crestor and beta blocker therapy with Metroprolol.  2. Chronic diastolic CHF: Recent acute exacerbation was treated with increase in Lasix dosing from 20 mg to 40 mg. She denies any recurrent dyspnea or lower extremity edema. She is euvolemic on physical exam today. For now we will continue 40 mg of Lasix daily. We will check a BMP today to reassess renal function and electrolytes. We discussed the importance of adhering to  a low-sodium diet. We also discussed the importance of daily weights to monitor for fluid retention.  3. Hyperlipidemia: LDL is at goal. 24 mg/dL. Continue statin therapy with Crestor.  4. Tobacco abuse: Patient is requesting prescription for Chantix. Starting dose pack was prescribed. She will like to see how she tolerates this. If she does well with this, we will prescribe continuing pack when she is seen by Dr. Tamala Julian next month.  5. Hypertension: The patient's blood pressure today in clinic is soft but stable at 100/68. She reports that her blood pressure has been running on the lower side recently and she has been mildly symptomatic with dizziness/lightheadedness. We will slightly decrease her Metroprolol to 37.5 mg twice a day to allow more room in blood pressure.  PLAN  Outlined above. Keep f/u appointment with Dr. Tamala Julian on 05/23/15.   Lyda Jester PA-C 04/28/2015 11:42 AM

## 2015-04-28 NOTE — Patient Instructions (Addendum)
Medication Instructions:   START TAKING METOPROLOL 37.5 ( A TABLET AND HALF ) TWICE A DAY    START CHANTIX  STARTER  ONE MONTH  AND AFTER DR Tamala Julian FOLLOW UP WILL DETERMINE TO FURTHER  CONTINUE   If you need a refill on your cardiac medications before your next appointment, please call your pharmacy.  Labwork: BMP   Testing/Procedures: NONE ORDER TODAY    Follow-Up:  WITH DR Tamala Julian AS SCHEDULED      Any Other Special Instructions Will Be Listed Below (If Applicable).

## 2015-05-02 ENCOUNTER — Telehealth: Payer: Self-pay | Admitting: *Deleted

## 2015-05-02 NOTE — Telephone Encounter (Signed)
-----   Message from Consuelo Pandy, [place] sent at 05/01/2015  4:57 PM EST ----- Renal function and potassium remain stable. Ok to continue 40 mg of lasix daily.

## 2015-05-02 NOTE — Telephone Encounter (Signed)
Called pt to make her aware that her renal function and potassium level remains stable and to continue with the Lasix 40 mg daily.  Pt verbalized understanding.

## 2015-05-03 ENCOUNTER — Encounter (HOSPITAL_COMMUNITY)
Admission: RE | Admit: 2015-05-03 | Discharge: 2015-05-03 | Disposition: A | Discharge status: Home / Self Care | Payer: BLUE CROSS/BLUE SHIELD | Source: Ambulatory Visit | Attending: Interventional Cardiology | Admitting: Interventional Cardiology

## 2015-05-03 ENCOUNTER — Encounter (HOSPITAL_COMMUNITY): Payer: Self-pay

## 2015-05-03 DIAGNOSIS — I213 ST elevation (STEMI) myocardial infarction of unspecified site: Secondary | ICD-10-CM | POA: Diagnosis present

## 2015-05-03 NOTE — Progress Notes (Signed)
Pt started cardiac rehab today.  Pt tolerated light exercise without difficulty. VSS, telemetry-sinus rhythm, asymptomatic.  Medication list reconciled.  Pt verbalized compliance with medications and denies barriers to compliance. PSYCHOSOCIAL ASSESSMENT:  PHQ-0. Pt exhibits positive coping skills, hopeful outlook with supportive family. No psychosocial needs identified at this time, no psychosocial interventions necessary.  Pt continues to smoke, however is interested in quitting. Pt has prescription for chantix however is unable to afford $200 out of pocket cost and has concerns because chantix has caused her nausea vomiting in the past. Pt congratulated on efforts to cut in half the amount she smokes and counseled to continue smoking cessation efforts.  Pt advised to call [phone].    Pt enjoys spending time with her grandchildren, although they live in [place].  Her oldest grandchild has down's syndrome.   Pt cardiac rehab  goal is to live heart healthy lifestyle, reduce anxiety about cardiac disease and develop a habit of exercise.  Pt encouraged to participate in risk factor modification education classes and participate in home exercise  to increase ability to achieve these goals.   Pt oriented to exercise equipment and routine.  Understanding verbalized.

## 2015-05-05 ENCOUNTER — Encounter (HOSPITAL_COMMUNITY)
Admission: RE | Admit: 2015-05-05 | Discharge: 2015-05-05 | Disposition: A | Discharge status: Home / Self Care | Payer: BLUE CROSS/BLUE SHIELD | Source: Ambulatory Visit | Attending: Interventional Cardiology | Admitting: Interventional Cardiology

## 2015-05-05 ENCOUNTER — Ambulatory Visit: Payer: BLUE CROSS/BLUE SHIELD | Admitting: Internal Medicine

## 2015-05-05 DIAGNOSIS — I213 ST elevation (STEMI) myocardial infarction of unspecified site: Secondary | ICD-10-CM | POA: Diagnosis not present

## 2015-05-08 ENCOUNTER — Encounter (HOSPITAL_COMMUNITY): Payer: BLUE CROSS/BLUE SHIELD

## 2015-05-12 ENCOUNTER — Encounter (HOSPITAL_COMMUNITY): Payer: BLUE CROSS/BLUE SHIELD

## 2015-05-15 ENCOUNTER — Encounter (HOSPITAL_COMMUNITY): Payer: BLUE CROSS/BLUE SHIELD

## 2015-05-15 ENCOUNTER — Telehealth: Payer: Self-pay | Admitting: Interventional Cardiology

## 2015-05-15 NOTE — Telephone Encounter (Signed)
New message      Patient calling the office for samples of medication:   1.  What medication and dosage are you requesting samples for? brilinta 90mg   2.  Are you currently out of this medication? Have 2 bottles left

## 2015-05-16 ENCOUNTER — Telehealth: Payer: Self-pay | Admitting: Interventional Cardiology

## 2015-05-16 NOTE — Telephone Encounter (Signed)
Do not have any samples. Please check tomorrow

## 2015-05-16 NOTE — Telephone Encounter (Signed)
called to let pt know we do not have samples.

## 2015-05-16 NOTE — Telephone Encounter (Signed)
Patient calling the office for samples of medication:   1.  What medication and dosage are you requesting samples for?Berlinta 90mg   2.  Are you currently out of this medication? Enough til end of week   Pt want a call back if some come in//please call pt.

## 2015-05-17 ENCOUNTER — Encounter (HOSPITAL_COMMUNITY): Payer: BLUE CROSS/BLUE SHIELD

## 2015-05-17 ENCOUNTER — Ambulatory Visit: Payer: BLUE CROSS/BLUE SHIELD | Admitting: Internal Medicine

## 2015-05-18 NOTE — Telephone Encounter (Signed)
Patient states she has sent her letter of appeal to [employer]. Hasn't heard anything yet. Brilinta 90mg  5 bottles provided. She will see Dr. Tamala Julian in December.

## 2015-05-19 ENCOUNTER — Encounter (HOSPITAL_COMMUNITY): Payer: BLUE CROSS/BLUE SHIELD

## 2015-05-22 ENCOUNTER — Encounter (HOSPITAL_COMMUNITY)
Admission: RE | Admit: 2015-05-22 | Discharge: 2015-05-22 | Disposition: A | Discharge status: Home / Self Care | Payer: BLUE CROSS/BLUE SHIELD | Source: Ambulatory Visit | Attending: Interventional Cardiology | Admitting: Interventional Cardiology

## 2015-05-22 DIAGNOSIS — I213 ST elevation (STEMI) myocardial infarction of unspecified site: Secondary | ICD-10-CM | POA: Diagnosis present

## 2015-05-24 ENCOUNTER — Encounter (HOSPITAL_COMMUNITY): Payer: BLUE CROSS/BLUE SHIELD

## 2015-05-26 ENCOUNTER — Encounter (HOSPITAL_COMMUNITY): Payer: BLUE CROSS/BLUE SHIELD

## 2015-05-29 ENCOUNTER — Encounter (HOSPITAL_COMMUNITY)
Admission: RE | Admit: 2015-05-29 | Discharge: 2015-05-29 | Disposition: A | Discharge status: Home / Self Care | Payer: BLUE CROSS/BLUE SHIELD | Source: Ambulatory Visit | Attending: Interventional Cardiology | Admitting: Interventional Cardiology

## 2015-05-29 ENCOUNTER — Other Ambulatory Visit (INDEPENDENT_AMBULATORY_CARE_PROVIDER_SITE_OTHER): Payer: BLUE CROSS/BLUE SHIELD | Admitting: *Deleted

## 2015-05-29 ENCOUNTER — Telehealth: Payer: Self-pay | Admitting: Interventional Cardiology

## 2015-05-29 DIAGNOSIS — I213 ST elevation (STEMI) myocardial infarction of unspecified site: Secondary | ICD-10-CM | POA: Diagnosis not present

## 2015-05-29 DIAGNOSIS — E785 Hyperlipidemia, unspecified: Secondary | ICD-10-CM | POA: Diagnosis not present

## 2015-05-29 LAB — ALT: ALT: 25 U/L (ref 6–29)

## 2015-05-29 LAB — LIPID PANEL
Cholesterol: 95 mg/dL — ABNORMAL LOW (ref 125–200)
HDL: 42 mg/dL — ABNORMAL LOW (ref >=46)
LDL CALC: 25 mg/dL (ref <130)
TRIGLYCERIDES: 141 mg/dL (ref <150)
Total CHOL/HDL Ratio: 2.3 Ratio (ref <=5.0)
VLDL: 28 mg/dL (ref <30)

## 2015-05-29 NOTE — Progress Notes (Signed)
Pt arrived at cardiac rehab reporting fall at home that occurred Friday.  Pt has bruising present on left forehead and lower back.  Pt reports she slipped on a bathroom rug hit her head on door handle then fell backwards hitting her back.  Pt denies LOC, confusion, dizziness or headache.  Pt pupils PERRLA.  Pt alert and oriented x4.  Equal strength in all 4 extremities.  Pt did not report her fall to MD.  Pt weight also up 4kg today at cardiac rehab. Pt admits to eating salt today.  Pt denies dyspnea.  Pt lungs clear, trace pitting ankle edema bilaterally.  PC to Dr. Thompson Caul nurse Lattie Haw to advise.  Pt has appt scheduled with Dr. Tamala Julian tomorrow.  Pt instructed to present to ED is symptoms worsen.  Pt verbalized understanding.

## 2015-05-29 NOTE — Telephone Encounter (Signed)
Returned call to [facility]. She reports that pt tripped and fell and hit her head on 12/8. She has a bruise on her forehead that has began to heal. She reports that pt has had a 5-7lb weight gain. Pt sts that she has had some dietary indiscretion. Pt has a f/u appt with Dr.Smith tomorrow. Pt should keep her appt

## 2015-05-29 NOTE — Telephone Encounter (Signed)
New message      Pt is at cardiac rehab.  She fell last fri and hit her head.  She has bruising on forehead.  Pt is on brilinta.  Calling to talk to the nurse since pt is on blood thinner.

## 2015-05-30 ENCOUNTER — Ambulatory Visit (INDEPENDENT_AMBULATORY_CARE_PROVIDER_SITE_OTHER): Payer: BLUE CROSS/BLUE SHIELD | Admitting: Interventional Cardiology

## 2015-05-30 DIAGNOSIS — I4510 Unspecified right bundle-branch block: Secondary | ICD-10-CM

## 2015-05-30 DIAGNOSIS — I2510 Atherosclerotic heart disease of native coronary artery without angina pectoris: Secondary | ICD-10-CM

## 2015-05-30 DIAGNOSIS — E785 Hyperlipidemia, unspecified: Secondary | ICD-10-CM

## 2015-05-30 DIAGNOSIS — I5031 Acute diastolic (congestive) heart failure: Secondary | ICD-10-CM

## 2015-05-30 DIAGNOSIS — I10 Essential (primary) hypertension: Secondary | ICD-10-CM

## 2015-05-30 NOTE — Telephone Encounter (Signed)
Follow up  Pt called and cnaceled the appt. She request to have her squeezed in on another day. Pt reports that she couldn't start her car. Please assist

## 2015-05-30 NOTE — Progress Notes (Signed)
No show

## 2015-05-31 ENCOUNTER — Other Ambulatory Visit: Payer: Self-pay

## 2015-05-31 ENCOUNTER — Encounter (HOSPITAL_COMMUNITY)
Admission: RE | Admit: 2015-05-31 | Discharge: 2015-05-31 | Disposition: A | Discharge status: Home / Self Care | Payer: BLUE CROSS/BLUE SHIELD | Source: Ambulatory Visit | Attending: Interventional Cardiology | Admitting: Interventional Cardiology

## 2015-05-31 DIAGNOSIS — E785 Hyperlipidemia, unspecified: Secondary | ICD-10-CM

## 2015-05-31 DIAGNOSIS — I213 ST elevation (STEMI) myocardial infarction of unspecified site: Secondary | ICD-10-CM | POA: Diagnosis not present

## 2015-05-31 MED ORDER — ROSUVASTATIN CALCIUM 5 MG PO TABS: 5.0000 mg | ORAL_TABLET | Freq: Every evening | ORAL | Status: DC

## 2015-05-31 MED ORDER — FUROSEMIDE 40 MG PO TABS: 40.0000 mg | ORAL_TABLET | Freq: Every day | ORAL | Status: DC

## 2015-05-31 NOTE — Telephone Encounter (Signed)
Pt aware lab results and Dr.Smith's recommendation. Okay to decrease the Rosuvastatin to 5 mg daily.  Pt sts that she did have a 5-7 lb weight gain, pt denies sob, she does report having some LE swelling. Pt sts that on her own she took an additional 20mg  of Lasix on 12/12, her LE swelling has resolved. Pt is down 3-4lbs,  Reiterated that if she has a fall and hits her head or injures herself she should seek emergent care for imaging because she is on Brilinta. Pt verbalized understanding. Pt sts that she currently asymptomatic.  Pt request a refill for Lasix 40mg  qd,  Rx for Crestor 5mg  qd also sent to pt pharmacy.  Adv pt that Dr.Smith does not have any availability since she was unable to keep her scheduled 12/13 appt. She has a March 2017 with Dr.Smith. Adv pt that I will work on getting her a sooner appt, it may need to be with another provider, adv her I will call her back with an appt. Pt voiced appreciation and verbalized understanding P

## 2015-05-31 NOTE — Telephone Encounter (Signed)
-----   Message from Belva Crome, MD sent at 05/29/2015  6:13 PM EST ----- Okay to decrease the Rosuvastatin to 5 mg daily.

## 2015-06-01 ENCOUNTER — Encounter: Payer: Self-pay | Admitting: Interventional Cardiology

## 2015-06-01 NOTE — Telephone Encounter (Signed)
Called pt to offer her an appt with Marcina Millard for Jan 3,2017 @3pm . Pt sts that she is doing well, and does not feel she needs to be seen back that early. Pt has an appt with Dr.Smith for March 2017. Pt sts that she will keep that appt. Pt sts she will call back to be see sooner if symptoms develop.

## 2015-06-02 ENCOUNTER — Encounter (HOSPITAL_COMMUNITY): Payer: BLUE CROSS/BLUE SHIELD

## 2015-06-02 ENCOUNTER — Ambulatory Visit: Payer: BLUE CROSS/BLUE SHIELD | Admitting: Interventional Cardiology

## 2015-06-05 ENCOUNTER — Encounter (HOSPITAL_COMMUNITY): Payer: BLUE CROSS/BLUE SHIELD

## 2015-06-06 ENCOUNTER — Emergency Department (HOSPITAL_COMMUNITY)
Admission: EM | Admit: 2015-06-06 | Discharge: 2015-06-06 | Discharge status: Against Medical Advice | Payer: BLUE CROSS/BLUE SHIELD | Attending: Emergency Medicine | Admitting: Emergency Medicine

## 2015-06-06 ENCOUNTER — Telehealth: Payer: Self-pay | Admitting: Internal Medicine

## 2015-06-06 ENCOUNTER — Encounter (HOSPITAL_COMMUNITY): Payer: Self-pay | Admitting: *Deleted

## 2015-06-06 ENCOUNTER — Telehealth: Payer: Self-pay | Admitting: Interventional Cardiology

## 2015-06-06 DIAGNOSIS — R112 Nausea with vomiting, unspecified: Secondary | ICD-10-CM | POA: Insufficient documentation

## 2015-06-06 DIAGNOSIS — I2510 Atherosclerotic heart disease of native coronary artery without angina pectoris: Secondary | ICD-10-CM | POA: Diagnosis not present

## 2015-06-06 DIAGNOSIS — I5032 Chronic diastolic (congestive) heart failure: Secondary | ICD-10-CM | POA: Insufficient documentation

## 2015-06-06 DIAGNOSIS — I10 Essential (primary) hypertension: Secondary | ICD-10-CM | POA: Diagnosis not present

## 2015-06-06 DIAGNOSIS — R197 Diarrhea, unspecified: Secondary | ICD-10-CM | POA: Insufficient documentation

## 2015-06-06 DIAGNOSIS — F17210 Nicotine dependence, cigarettes, uncomplicated: Secondary | ICD-10-CM | POA: Diagnosis not present

## 2015-06-06 LAB — COMPREHENSIVE METABOLIC PANEL
ALBUMIN: 4 g/dL (ref 3.5–5.0)
ALK PHOS: 122 U/L (ref 38–126)
ALT: 22 U/L (ref 14–54)
ANION GAP: 10 (ref 5–15)
AST: 26 U/L (ref 15–41)
BILIRUBIN TOTAL: 0.5 mg/dL (ref 0.3–1.2)
BUN: 14 mg/dL (ref 6–20)
CALCIUM: 10.8 mg/dL — AB (ref 8.9–10.3)
CO2: 26 mmol/L (ref 22–32)
Chloride: 105 mmol/L (ref 101–111)
Creatinine, Ser: 0.93 mg/dL (ref 0.44–1.00)
GFR calc Af Amer: >60 mL/min (ref >60)
GLUCOSE: 104 mg/dL — AB (ref 65–99)
Potassium: 4.3 mmol/L (ref 3.5–5.1)
Sodium: 141 mmol/L (ref 135–145)
TOTAL PROTEIN: 7.6 g/dL (ref 6.5–8.1)

## 2015-06-06 LAB — CBC
HCT: 44.1 % (ref 36.0–46.0)
HEMOGLOBIN: 14.5 g/dL (ref 12.0–15.0)
MCH: 29.1 pg (ref 26.0–34.0)
MCHC: 32.9 g/dL (ref 30.0–36.0)
MCV: 88.4 fL (ref 78.0–100.0)
Platelets: 278 10*3/uL (ref 150–400)
RBC: 4.99 MIL/uL (ref 3.87–5.11)
RDW: 15.2 % (ref 11.5–15.5)
WBC: 10.2 10*3/uL (ref 4.0–10.5)

## 2015-06-06 LAB — LIPASE, BLOOD: Lipase: 44 U/L (ref 11–51)

## 2015-06-06 NOTE — Telephone Encounter (Signed)
Spoke to the pt.  She complains of watery diarrhea (yellow like sand).  Confirmed that she has not voided since Sunday.  She also has chills, abdominal cramping and a raw bottom.  Treating her bottom with Desitin diaper rash cream.  She tried liquid loperamide but vomited.  Now has the pill form and will try it.  Anselmo Pickler, LPN spoke to Dr. Raliegh Ip who suggested she go back to the emergency room.  Tried reaching the pt.  Left a message on home phone for a return call.

## 2015-06-06 NOTE — Telephone Encounter (Signed)
Returned pt call. Pt sts that she has had vomiting and diarrhea since 10/17. Pt denies any cardiac symptoms Pt sts that the vomiting has subsided today she was able to keep her medications down, but she is still having diarrhea. Adv pt to stay hydrated, and to f/u with her pcp for persistent diarrhea. Pt verbalized understanding.

## 2015-06-06 NOTE — Telephone Encounter (Signed)
Noted.  If pt leaves it will be against medical advise.

## 2015-06-06 NOTE — Telephone Encounter (Signed)
[facility] - Client [place] [facility]  Patient Name: ARREON AREND  DOB: 04/03/1956    Initial Comment [name] states she was exposed to a stomach bug. Diarrhea. She has had Diarrhea since Saturday, now she can't urinate.   Nurse Assessment  Nurse: Wynetta Emery, RN, Baker Janus Date/Time Eilene Ghazi Time): 06/06/2015 10:29:28 AM  Confirm and document reason for call. If symptomatic, describe symptoms. ---Hilda Blades started with vomiting on Saturday night -- then started with diarrhea just fluid vomiting slowed down  Has the patient traveled out of the country within the last 30 days? ---No  Does the patient have any new or worsening symptoms? ---Yes  Will a triage be completed? ---Yes  Related visit to physician within the last 2 weeks? ---No  Does the PT have any chronic conditions? (i.e. diabetes, asthma, etc.) ---Yes  List chronic conditions. ---MI  Is this a behavioral health or substance abuse call? ---No     Guidelines    Guideline Title Affirmed Question Affirmed Notes  Diarrhea [1] Drinking very little AND [2] dehydration suspected (e.g., no urine > 12 hours, very dry mouth, very lightheaded)    Final Disposition User   Go to ED Now (or PCP triage) Wynetta Emery, RN, Baker Janus    Comments  NOTE; Patient has not urinated since Sunday am and is having liquid diarrhea --everything goes in comes out having spasms in abdomen and sending her to ED for IV fluids weak vomiting has slowed down was able to keep toast down.   Referrals  [facility] - ED   Disagree/Comply: Comply

## 2015-06-06 NOTE — ED Notes (Addendum)
Pt complains of nausea, vomiting since Saturday. Diarrhea since Sunday. Pt states she has lower abdominal cramps when she eats or drinks. Pt states she has not urinated since Sunday, states she is dehydrated.

## 2015-06-06 NOTE — Telephone Encounter (Signed)
Catherine Craig called saying she's been vomiting and had diarrhea since Saturday so I transferred her to the triage line because she was asking if she needed an IV.  Thank you.

## 2015-06-06 NOTE — Telephone Encounter (Signed)
New message     Pt has vomiting and diarrhea.  She is concerned that her medications are not staying down.  Please advise

## 2015-06-06 NOTE — Telephone Encounter (Signed)
Seen in ED on 06/06/15

## 2015-06-06 NOTE — Telephone Encounter (Signed)
Pt went to ED

## 2015-06-06 NOTE — Telephone Encounter (Signed)
Pt returned my call.  Advised her that Dr. Raliegh Ip would like her to go back to the ED.  Patient has declined at this time.  She stated that she may go back tonight.  Strongly urged the pt to re visit the ED due to possible dehydration.

## 2015-06-06 NOTE — Telephone Encounter (Signed)
Returned pt call. lmtcb if assistance is still needed 

## 2015-06-06 NOTE — Telephone Encounter (Signed)
Patient Name: Catherine Craig DOB: 07-23-1955 Initial Comment [name] states she is at the ER as previously instructed by triage nurse, not being serviced at all, wants to know if meds can be called in instead Nurse Assessment Nurse: Ronnald Ramp, RN, Miranda Date/Time (Eastern Time): 06/06/2015 1:38:15 PM Confirm and document reason for call. If symptomatic, describe symptoms. ---[name] states she spoke with a nurse earlier and was told to go to the ED for evaluation for diarrhea and concerns for dehydration. She is at the ED and has not been seen. She is still having diarrhea and still has not urinated. She is wanting to know if she can just go home and take OTC meds or can the doctor just called her something in. She is refusing to stay at the ED. Has the patient traveled out of the country within the last 30 days? ---Not Applicable Does the patient have any new or worsening symptoms? ---No Please document clinical information provided and list any resource used. ---Told caller that there is not anything to call in for diarrhea. Also that since she still has not urinated she needs to stay at the ED to be seen. [name] states she is not getting seen and is just going home. Guidelines Guideline Title Affirmed Question Affirmed Notes Final Disposition User Clinical Call Ronnald Ramp, RN, [employer]

## 2015-06-07 ENCOUNTER — Encounter (HOSPITAL_COMMUNITY): Payer: BLUE CROSS/BLUE SHIELD

## 2015-06-07 ENCOUNTER — Encounter: Payer: Self-pay | Admitting: Interventional Cardiology

## 2015-06-09 ENCOUNTER — Telehealth: Payer: Self-pay | Admitting: Interventional Cardiology

## 2015-06-09 ENCOUNTER — Encounter (HOSPITAL_COMMUNITY)
Admission: RE | Admit: 2015-06-09 | Discharge: 2015-06-09 | Disposition: A | Discharge status: Home / Self Care | Payer: BLUE CROSS/BLUE SHIELD | Source: Ambulatory Visit | Attending: Interventional Cardiology | Admitting: Interventional Cardiology

## 2015-06-09 ENCOUNTER — Encounter (HOSPITAL_COMMUNITY): Payer: Self-pay

## 2015-06-09 DIAGNOSIS — I213 ST elevation (STEMI) myocardial infarction of unspecified site: Secondary | ICD-10-CM | POA: Diagnosis not present

## 2015-06-09 NOTE — Progress Notes (Signed)
Pt plans to graduate from [facility] on 06/16/15.  Pt exiting program early due to high insurance copay in 2017.  Pt feels she has made progress and her most important goal was to be able to return to her spinal physical therapy.  Pt states she has regained her confidence to be able to do this.  Pt reports difficulty in obtaining her Brilinta. Dr. Thompson Caul office is working with her on patient assistance and she is able to get samples from.  Unfortunately pt continues to smoke. She reports she has cut back to 1/2 pack however is not committed to stopping completely.  She admits it is difficult to stop since her husband is also a smoker.   Medication list reconciled. Repeat  PHQ score-0  .  Pt would like to be able to continue coming to the nutrition classes offered on Tuesdays after her graduation.  Pt states she is committed to making dietary changes.  Pt will need continued physician support to continue making lifestyle modifications.

## 2015-06-09 NOTE — Telephone Encounter (Signed)
New message      Patient calling the office for samples of medication:   1.  What medication and dosage are you requesting samples for? brilinta 90mg   2.  Are you currently out of this medication?  Almost out

## 2015-06-13 ENCOUNTER — Telehealth: Payer: Self-pay | Admitting: *Deleted

## 2015-06-13 ENCOUNTER — Telehealth: Payer: Self-pay | Admitting: Interventional Cardiology

## 2015-06-13 NOTE — Telephone Encounter (Signed)
New message    Patient calling the office for samples of medication:   1.  What medication and dosage are you requesting samples for? brilinta  90 mg   2.  Are you currently out of this medication? Yes

## 2015-06-13 NOTE — Telephone Encounter (Signed)
Called patient to let him know that we have one bottle of brilinta available for him to pick up.

## 2015-06-14 ENCOUNTER — Encounter (HOSPITAL_COMMUNITY): Payer: BLUE CROSS/BLUE SHIELD

## 2015-06-15 ENCOUNTER — Telehealth: Payer: Self-pay | Admitting: Interventional Cardiology

## 2015-06-15 NOTE — Telephone Encounter (Signed)
INFORMED PT THAT BRILINTA (90 MG) SAMPLES PLACED UP FRONT

## 2015-06-15 NOTE — Telephone Encounter (Signed)
°  Patient calling the office for samples of medication:   1.  What medication and dosage are you requesting samples for?Berlanta 90mg   2.  Are you currently out of this medication? Enough for 2 more days

## 2015-06-16 ENCOUNTER — Encounter (HOSPITAL_COMMUNITY): Payer: BLUE CROSS/BLUE SHIELD

## 2015-06-20 ENCOUNTER — Ambulatory Visit: Payer: BLUE CROSS/BLUE SHIELD | Admitting: Physician Assistant

## 2015-06-21 ENCOUNTER — Telehealth: Payer: Self-pay | Admitting: Interventional Cardiology

## 2015-06-21 NOTE — Telephone Encounter (Signed)
Patient calling the office for samples of medication:   1.  What medication and dosage are you requesting samples for? brilinta 90 mg   2.  Are you currently out of this medication? Has 4 days left

## 2015-06-21 NOTE — Telephone Encounter (Signed)
Message fwd to Dr.Smith to advise 

## 2015-06-21 NOTE — Telephone Encounter (Signed)
left a month of brilinta samples plaed up front. will message lisa about a possible alternative.

## 2015-06-26 ENCOUNTER — Ambulatory Visit (INDEPENDENT_AMBULATORY_CARE_PROVIDER_SITE_OTHER): Payer: BLUE CROSS/BLUE SHIELD | Admitting: Internal Medicine

## 2015-06-26 ENCOUNTER — Encounter: Payer: Self-pay | Admitting: Internal Medicine

## 2015-06-26 VITALS — BP 110/76 | HR 88 | Temp 98.3°F | Resp 20 | Ht 61.0 in | Wt 157.0 lb

## 2015-06-26 DIAGNOSIS — E785 Hyperlipidemia, unspecified: Secondary | ICD-10-CM

## 2015-06-26 DIAGNOSIS — I2510 Atherosclerotic heart disease of native coronary artery without angina pectoris: Secondary | ICD-10-CM

## 2015-06-26 DIAGNOSIS — Z9861 Coronary angioplasty status: Secondary | ICD-10-CM

## 2015-06-26 DIAGNOSIS — D649 Anemia, unspecified: Secondary | ICD-10-CM

## 2015-06-26 DIAGNOSIS — N179 Acute kidney failure, unspecified: Secondary | ICD-10-CM

## 2015-06-26 DIAGNOSIS — Z720 Tobacco use: Secondary | ICD-10-CM

## 2015-06-26 DIAGNOSIS — I10 Essential (primary) hypertension: Secondary | ICD-10-CM

## 2015-06-26 DIAGNOSIS — I5033 Acute on chronic diastolic (congestive) heart failure: Secondary | ICD-10-CM | POA: Diagnosis not present

## 2015-06-26 DIAGNOSIS — I4510 Unspecified right bundle-branch block: Secondary | ICD-10-CM

## 2015-06-26 DIAGNOSIS — D72829 Elevated white blood cell count, unspecified: Secondary | ICD-10-CM

## 2015-06-26 MED ORDER — CLOPIDOGREL BISULFATE 75 MG PO TABS: 75.0000 mg | ORAL_TABLET | Freq: Every day | ORAL | Status: DC

## 2015-06-26 MED ORDER — CYCLOBENZAPRINE HCL 10 MG PO TABS: 10.0000 mg | ORAL_TABLET | Freq: Three times a day (TID) | ORAL | Status: DC | PRN

## 2015-06-26 MED ORDER — HYDROCODONE-ACETAMINOPHEN 10-325 MG PO TABS: 1.0000 | ORAL_TABLET | Freq: Three times a day (TID) | ORAL | Status: DC | PRN

## 2015-06-26 NOTE — Telephone Encounter (Signed)
Pt aware of Dr.Smith's response Switch to plavix 75 mg po with last Brilinta tablet, then 75 mg daily.. Rx sent to pt pharmacy to pt pharmacy. Pt voiced appreciation and verbalized understanding.

## 2015-06-26 NOTE — Telephone Encounter (Signed)
Switch to plavix 75 mg po with last Brilinta tablet, then 75 mg daily.

## 2015-06-26 NOTE — Progress Notes (Signed)
Pre visit review using our clinic review tool, if applicable. No additional management support is needed unless otherwise documented below in the visit note. 

## 2015-06-26 NOTE — Progress Notes (Signed)
Subjective:    Patient ID: Catherine Craig, female    DOB: 1956-04-11, 60 y.o.   MRN: [id number]  HPI  60 year old patient who is seen today for her six-month follow-up.  In this interval, she has been followed by cardiology and hospitalized for a STEMI and is followed closely by cardiology. She is also had recurrent back pain and continues to have the discomfort.  Presently she is on tramadol, but she states this is ineffective.  She has been on hydrocodone and oxycodone in the past She has essential hypertension. She states that she is making an effort for smoking cessation but still at 1 half pack per day  Her cardiac status seems stable  She is requesting a refill on her medications  Past Medical History  Diagnosis Date  . ADHD   . Allergic rhinitis   . Basal cell carcinoma   . Fibromyalgia   . GERD   . Dyslipidemia   . Essential hypertension   . MENOPAUSAL SYNDROME 12/15/2006  . Osteoarthritis   . Renal colic [id number]  . VAGINITIS, ATROPHIC 01/05/2007  . Histoplasmosis with retinitis   . Anxiety   . IBS (irritable bowel syndrome)   . Anal fissure   . Thoracic outlet syndrome   . Lipoma     on chest  . CAD (coronary artery disease)     a. Inferior STEMI -  initial cath 02/11/15 revealed 80% mid LAD, 70% D1, chronically occluded mid LCX, 50% mid RCA, 70% distal RCA, acute occlusion of prox RCA s/p DES to prox RCA. Planned re-cath 02/16/15 s/p DES to mLAD, DES to distal RCA. Med rx for chronically occluded mLCx.  . Cardiogenic shock ([place])     a. 01/2015 in setting of inferior STEMI, possibly 2/2 cardiogenic shock.  . NSVT (nonsustained ventricular tachycardia) ([place])     a. 6 beats 01/2015 after STEMI in setting of low K, Mg.  . Tobacco abuse   . Normocytic anemia   . Valvular heart disease     a. Mild AI/MR, mod TR by echo 02/12/15.  Marland Kitchen Hypokalemia   . Hypomagnesemia   . Leukocytosis   . Acute kidney injury ([place])   . Chronic diastolic CHF (congestive heart failure) ([place])     . RBBB     Social History   Social History  . Marital Status: Married    Spouse Name: N/A  . Number of Children: N/A  . Years of Education: N/A   Occupational History  . Not on file.   Social History Main Topics  . Smoking status: Current Some Day Smoker -- 0.50 packs/day for 30 years    Types: Cigarettes    Last Attempt to Quit: 02/24/2015  . Smokeless tobacco: Never Used     Comment: using electronic cigarette as of 03/2012  . Alcohol Use: 0.0 oz/week    0 Standard drinks or equivalent per week     Comment: social  . Drug Use: No  . Sexual Activity: Not on file   Other Topics Concern  . Not on file   Social History Narrative    Past Surgical History  Procedure Laterality Date  . Appendectomy  1973  . Tonsillectomy  1980  . Dilation and curettage of uterus  1982  . Tonsillectomy    . Eye surgery      Lazer to right x 6  . Spine surgery  04/16/2012  . Cardiac catheterization N/A 02/11/2015    Procedure: Left Heart Cath and Coronary Angiography;  Surgeon: Belva Crome, MD;  Location: [facility];  Service: Cardiovascular;  Laterality: N/A;  . Cardiac catheterization N/A 02/11/2015    Procedure: Coronary Stent Intervention;  Surgeon: Belva Crome, MD;  Location: [facility];  Service: Cardiovascular;  Laterality: N/A;  . Cardiac catheterization N/A 02/16/2015    Procedure: Coronary Stent Intervention;  Surgeon: Peter M Martinique, MD;  Location: [facility];  Service: Cardiovascular;  Laterality: N/A;  . Coronary stent placement      Family History  Problem Relation Age of Onset  . COPD Mother   . Vascular Disease Mother   . ADD / ADHD Brother   . CAD Father   . Crohn's disease Daughter   . Heart attack Paternal Grandfather 6  . Stroke Other     1st degree relative <60    Allergies  Allergen Reactions  . Adderall [Amphetamine-Dextroamphetamine] Other (See Comments)    High heart rate, stops taking for surgeries  . Bee Venom Swelling   . Fluorescein Hives  . Lyrica [Pregabalin] Swelling  . Chantix [Varenicline Tartrate] Nausea And Vomiting    Current Outpatient Prescriptions on File Prior to Visit  Medication Sig Dispense Refill  . aspirin EC 81 MG tablet Take 1 tablet (81 mg total) by mouth daily. 30 tablet 11  . B Complex-Folic Acid (SUPER B COMPLEX MAXI PO) Take 1 capsule by mouth at bedtime.     . Cholecalciferol (VITAMIN D PO) Take 2,000 Units by mouth 2 (two) times daily.     . clopidogrel (PLAVIX) 75 MG tablet Take 1 tablet (75 mg total) by mouth daily. 30 tablet 10  . Coenzyme Q10 (COQ-10) 200 MG CAPS Take 1 tablet by mouth 2 (two) times daily.    . furosemide (LASIX) 40 MG tablet Take 1 tablet (40 mg total) by mouth daily. 30 tablet 5  . gabapentin (NEURONTIN) 400 MG capsule Take 400 mg by mouth at bedtime.     . magnesium oxide (MAG-OX) 400 (241.3 MG) MG tablet Take 1 tablet (400 mg total) by mouth daily. 30 tablet 6  . metoprolol succinate (TOPROL XL) 25 MG 24 hr tablet Take 1.5 tablets (37.5 mg total) by mouth 2 (two) times daily. 90 tablet 3  . Multiple Vitamin (MULTIVITAMIN WITH MINERALS) TABS Take 0.5 tablets by mouth 2 (two) times daily.    . nitroGLYCERIN (NITROSTAT) 0.4 MG SL tablet Place 1 tablet (0.4 mg total) under the tongue every 5 (five) minutes as needed for chest pain (up to 3 doses). 25 tablet 3  . potassium chloride SA (K-DUR,KLOR-CON) 20 MEQ tablet Take 1 tablet (20 mEq total) by mouth daily.    . rosuvastatin (CRESTOR) 5 MG tablet Take 1 tablet (5 mg total) by mouth every evening. 30 tablet 5  . sodium chloride (OCEAN) 0.65 % SOLN nasal spray Place 1 spray into both nostrils daily as needed for congestion.    . vitamin C (ASCORBIC ACID) 500 MG tablet Take 500 mg by mouth 2 (two) times daily.    . [DISCONTINUED] fluticasone (FLONASE) 50 MCG/ACT nasal spray Place 1 spray into the nose daily. 16 g 2  . [DISCONTINUED] pravastatin (PRAVACHOL) 40 MG tablet Take 1 tablet (40 mg total) by mouth every  evening. 90 tablet 3   No current facility-administered medications on file prior to visit.    BP 110/76 mmHg  Pulse 88  Temp(Src) 98.3 F (36.8 C) (Oral)  Resp 20  Ht 5\' 1"  (1.549 m)  Wt 157 lb (71.215 kg)  BMI 29.68 kg/m2  SpO2 98%     Review of Systems  Constitutional: Negative.   HENT: Negative for congestion, dental problem, hearing loss, rhinorrhea, sinus pressure, sore throat and tinnitus.   Eyes: Negative for pain, discharge and visual disturbance.  Respiratory: Negative for cough and shortness of breath.   Cardiovascular: Negative for chest pain, palpitations and leg swelling.  Gastrointestinal: Negative for nausea, vomiting, abdominal pain, diarrhea, constipation, blood in stool and abdominal distention.  Genitourinary: Negative for dysuria, urgency, frequency, hematuria, flank pain, vaginal bleeding, vaginal discharge, difficulty urinating, vaginal pain and pelvic pain.  Musculoskeletal: Positive for back pain and arthralgias. Negative for joint swelling and gait problem.  Skin: Negative for rash.  Neurological: Negative for dizziness, syncope, speech difficulty, weakness, numbness and headaches.  Hematological: Negative for adenopathy.  Psychiatric/Behavioral: Negative for behavioral problems, dysphoric mood and agitation. The patient is nervous/anxious.        Objective:   Physical Exam  Constitutional: She is oriented to person, place, and time. She appears well-developed and well-nourished.  Blood pressure low normal  HENT:  Head: Normocephalic.  Right Ear: External ear normal.  Left Ear: External ear normal.  Mouth/Throat: Oropharynx is clear and moist.  Eyes: Conjunctivae and EOM are normal. Pupils are equal, round, and reactive to light.  Neck: Normal range of motion. Neck supple. No thyromegaly present.  Cardiovascular: Normal rate, regular rhythm, normal heart sounds and intact distal pulses.   Pulmonary/Chest: Effort normal and breath sounds normal.  No respiratory distress. She has no wheezes. She has no rales.  Abdominal: Soft. Bowel sounds are normal. She exhibits no mass. There is no tenderness.  Musculoskeletal: Normal range of motion.  Lymphadenopathy:    She has no cervical adenopathy.  Neurological: She is alert and oriented to person, place, and time.  Skin: Skin is warm and dry. No rash noted.  Psychiatric: She has a normal mood and affect. Her behavior is normal.          Assessment & Plan:   Coronary artery disease.  Status post STEMI.  Follow-up cardiology Tobacco abuse.  Total smoking cessation encouraged.  She states that she is making a very active attempt at tapering and discontinuation Dyslipidemia.  Continue statin therapy Chronic low back pain.  She states tramadol was not effective and that hydrocodone has worked much better in the past.  Will refill Osteoarthritis ADHD

## 2015-06-26 NOTE — Patient Instructions (Signed)
Limit your sodium (Salt) intake  Smoking tobacco is very bad for your health. You should stop smoking immediately.  Return in 6 months for follow-up  Cardiology follow-up as scheduled

## 2015-07-06 ENCOUNTER — Other Ambulatory Visit: Payer: Self-pay | Admitting: Internal Medicine

## 2015-07-26 ENCOUNTER — Telehealth: Payer: Self-pay | Admitting: Internal Medicine

## 2015-07-26 NOTE — Telephone Encounter (Signed)
Pt needs new rx hydrocodone °

## 2015-07-27 MED ORDER — HYDROCODONE-ACETAMINOPHEN 10-325 MG PO TABS: 1.0000 | ORAL_TABLET | Freq: Three times a day (TID) | ORAL | Status: DC | PRN

## 2015-07-27 NOTE — Telephone Encounter (Signed)
Pt notified Rx ready for pickup. Rx printed and signed.  

## 2015-08-02 ENCOUNTER — Other Ambulatory Visit: Payer: Self-pay | Admitting: Cardiology

## 2015-08-24 ENCOUNTER — Telehealth: Payer: Self-pay | Admitting: Interventional Cardiology

## 2015-08-24 NOTE — Telephone Encounter (Signed)
°  New Prob   Pt is currently at [facility] and was admitted after a fall. Pt states she is due to have surgery next week to repair a fracture in her back. Calling to make Dr. Tamala Julian aware. Requesting a call back from a nurse.

## 2015-08-24 NOTE — Telephone Encounter (Signed)
Lmom. We do not have access to her hospitalization records at [facility]. I will fwd an FYI to Dr.Smith

## 2015-08-25 ENCOUNTER — Telehealth: Payer: Self-pay | Admitting: Internal Medicine

## 2015-08-25 MED ORDER — GABAPENTIN 400 MG PO CAPS: 400.0000 mg | ORAL_CAPSULE | Freq: Two times a day (BID) | ORAL | Status: DC

## 2015-08-25 NOTE — Telephone Encounter (Signed)
Left message on voicemail Rx sent to pharmacy.

## 2015-08-25 NOTE — Telephone Encounter (Signed)
Ok for BID

## 2015-08-25 NOTE — Telephone Encounter (Signed)
Dr.K, please see message about Gabapentin, okay to change directions to twice a day?

## 2015-08-25 NOTE — Telephone Encounter (Signed)
Spoke to pt, asked her who increased her Gabapentin? Pt said her Neurosurgeon told her to increase to twice a day. Told pt okay I will have to check with Dr.K to make sure okay to change Rx. If it is I will send to pharmacy. Pt verbalized understanding.

## 2015-08-25 NOTE — Telephone Encounter (Signed)
Pt states she has been taking the gabapentin (NEURONTIN) 400 MG capsule 2 X /day and pt instructed to take for nerve pain. Pt states it is normally taken 1 x/day, so pt will be out and needs a new refill sent to  [facility]/ [address]

## 2015-08-25 NOTE — Telephone Encounter (Signed)
Pt is going to have back surgery next Thursday (dr Jenne Campus, [facility]) pt advised to let you know. Pt cracked her tail bone and the nerve is getting caught in there and causing her leg to go numb.  One leg/foot swelled up as well, and they found out her uric acid was very high, do diagnosed with gout in foot as well.

## 2015-08-29 ENCOUNTER — Ambulatory Visit: Payer: BLUE CROSS/BLUE SHIELD | Admitting: Internal Medicine

## 2015-09-05 ENCOUNTER — Other Ambulatory Visit: Payer: Self-pay | Admitting: Physician Assistant

## 2015-09-05 ENCOUNTER — Ambulatory Visit: Payer: BLUE CROSS/BLUE SHIELD | Admitting: Internal Medicine

## 2015-09-08 ENCOUNTER — Encounter: Payer: Self-pay | Admitting: Interventional Cardiology

## 2015-09-08 ENCOUNTER — Ambulatory Visit (INDEPENDENT_AMBULATORY_CARE_PROVIDER_SITE_OTHER): Payer: BLUE CROSS/BLUE SHIELD | Admitting: Interventional Cardiology

## 2015-09-08 VITALS — BP 86/54 | HR 78 | Ht 61.0 in | Wt 182.0 lb

## 2015-09-08 DIAGNOSIS — I2510 Atherosclerotic heart disease of native coronary artery without angina pectoris: Secondary | ICD-10-CM | POA: Diagnosis not present

## 2015-09-08 DIAGNOSIS — I4510 Unspecified right bundle-branch block: Secondary | ICD-10-CM

## 2015-09-08 DIAGNOSIS — E785 Hyperlipidemia, unspecified: Secondary | ICD-10-CM

## 2015-09-08 MED ORDER — METOPROLOL SUCCINATE ER 25 MG PO TB24: 25.0000 mg | ORAL_TABLET | Freq: Every day | ORAL | Status: AC

## 2015-09-08 NOTE — Progress Notes (Signed)
Cardiology Office Note   Date:  09/08/2015   ID:  Catherine Craig, Catherine Craig February 06, 1956, MRN [id number]  PCP:  Nyoka Cowden, MD  Cardiologist:  Sinclair Grooms, MD   Chief Complaint  Patient presents with  . Coronary Artery Disease      History of Present Illness: Catherine Craig is a 60 y.o. female who presents for CAD with acute right ventricular infarction, DES implantation in the right coronary, tobacco abuse, chronic lumbar disc disease, and hyperlipidemia.  I have actually not seen the patient since her acute infarct in August. She has completed cardiac rehabilitation. She has seen several advanced practice providers. Overall she seems to be doing well and is having no angina. She has not had episodes of syncope. She denies nitroglycerin use.    Past Medical History  Diagnosis Date  . ADHD   . Allergic rhinitis   . Basal cell carcinoma   . Fibromyalgia   . GERD   . Dyslipidemia   . Essential hypertension   . MENOPAUSAL SYNDROME 12/15/2006  . Osteoarthritis   . Renal colic [id number]  . VAGINITIS, ATROPHIC 01/05/2007  . Histoplasmosis with retinitis   . Anxiety   . IBS (irritable bowel syndrome)   . Anal fissure   . Thoracic outlet syndrome   . Lipoma     on chest  . CAD (coronary artery disease)     a. Inferior STEMI -  initial cath 02/11/15 revealed 80% mid LAD, 70% D1, chronically occluded mid LCX, 50% mid RCA, 70% distal RCA, acute occlusion of prox RCA s/p DES to prox RCA. Planned re-cath 02/16/15 s/p DES to mLAD, DES to distal RCA. Med rx for chronically occluded mLCx.  . Cardiogenic shock ([place])     a. 01/2015 in setting of inferior STEMI, possibly 2/2 cardiogenic shock.  . NSVT (nonsustained ventricular tachycardia) ([place])     a. 6 beats 01/2015 after STEMI in setting of low K, Mg.  . Tobacco abuse   . Normocytic anemia   . Valvular heart disease     a. Mild AI/MR, mod TR by echo 02/12/15.  Marland Kitchen Hypokalemia   . Hypomagnesemia   . Leukocytosis   . Acute  kidney injury ([place])   . Chronic diastolic CHF (congestive heart failure) ([place])   . RBBB     Past Surgical History  Procedure Laterality Date  . Appendectomy  1973  . Tonsillectomy  1980  . Dilation and curettage of uterus  1982  . Tonsillectomy    . Eye surgery      Lazer to right x 6  . Spine surgery  04/16/2012  . Cardiac catheterization N/A 02/11/2015    Procedure: Left Heart Cath and Coronary Angiography;  Surgeon: Belva Crome, MD;  Location: [place] [facility];  Service: Cardiovascular;  Laterality: N/A;  . Cardiac catheterization N/A 02/11/2015    Procedure: Coronary Stent Intervention;  Surgeon: Belva Crome, MD;  Location: [place] [facility];  Service: Cardiovascular;  Laterality: N/A;  . Cardiac catheterization N/A 02/16/2015    Procedure: Coronary Stent Intervention;  Surgeon: Peter M Martinique, MD;  Location: [place] [facility];  Service: Cardiovascular;  Laterality: N/A;  . Coronary stent placement       Current Outpatient Prescriptions  Medication Sig Dispense Refill  . aspirin EC 81 MG tablet Take 1 tablet (81 mg total) by mouth daily. 30 tablet 11  . B Complex-Folic Acid (SUPER B COMPLEX MAXI PO) Take 1 capsule by mouth at  bedtime.     . Cholecalciferol (VITAMIN D PO) Take 2,000 Units by mouth 2 (two) times daily.     . clopidogrel (PLAVIX) 75 MG tablet Take 1 tablet (75 mg total) by mouth daily. 30 tablet 10  . Coenzyme Q10 (COQ-10) 200 MG CAPS Take 1 tablet by mouth 2 (two) times daily.    . cyclobenzaprine (FLEXERIL) 10 MG tablet Take 1 tablet (10 mg total) by mouth 3 (three) times daily as needed for muscle spasms (also 20 mg at night as needed). 30 tablet 2  . furosemide (LASIX) 20 MG tablet Take 20 mg by mouth as needed.     . furosemide (LASIX) 40 MG tablet Take 1 tablet (40 mg total) by mouth daily. 30 tablet 5  . gabapentin (NEURONTIN) 400 MG capsule Take 1 capsule (400 mg total) by mouth 2 (two) times daily. 180 capsule 1  . HYDROcodone-acetaminophen (NORCO)  10-325 MG tablet Take 1 tablet by mouth every 8 (eight) hours as needed. 90 tablet 0  . magnesium oxide (MAG-OX) 400 (241.3 Mg) MG tablet TAKE 1 TABLET (400 MG TOTAL) BY MOUTH DAILY. 30 tablet 0  . metoprolol succinate (TOPROL-XL) 25 MG 24 hr tablet TAKE 1&1/2 TABLETS BY MOUTH TWICE A DAY 270 tablet 3  . Multiple Vitamin (MULTIVITAMIN WITH MINERALS) TABS Take 0.5 tablets by mouth 2 (two) times daily.    . nitroGLYCERIN (NITROSTAT) 0.4 MG SL tablet Place 1 tablet (0.4 mg total) under the tongue every 5 (five) minutes as needed for chest pain (up to 3 doses). 25 tablet 3  . potassium chloride SA (K-DUR,KLOR-CON) 20 MEQ tablet Take 1 tablet (20 mEq total) by mouth daily.    . rosuvastatin (CRESTOR) 5 MG tablet Take 1 tablet (5 mg total) by mouth every evening. 30 tablet 5  . sodium chloride (OCEAN) 0.65 % SOLN nasal spray Place 1 spray into both nostrils daily as needed for congestion.    . vitamin C (ASCORBIC ACID) 500 MG tablet Take 500 mg by mouth 2 (two) times daily.    . [DISCONTINUED] fluticasone (FLONASE) 50 MCG/ACT nasal spray Place 1 spray into the nose daily. 16 g 2  . [DISCONTINUED] pravastatin (PRAVACHOL) 40 MG tablet Take 1 tablet (40 mg total) by mouth every evening. 90 tablet 3   No current facility-administered medications for this visit.    Allergies:   Adderall; Bee venom; Fluorescein; Hornet venom; Lyrica; and Chantix    Social History:  The patient  reports that she has been smoking Cigarettes.  She has a 15 pack-year smoking history. She has never used smokeless tobacco. She reports that she drinks alcohol. She reports that she does not use illicit drugs.   Family History:  The patient's family history includes ADD / ADHD in her brother; CAD in her father; COPD in her mother; Crohn's disease in her daughter; Heart attack (age of onset: 47) in her paternal grandfather; Stroke in her other; Vascular Disease in her mother.    ROS:  Please see the history of present illness.    Otherwise, review of systems are positive for Recent lumbar disc surgery by Dr. Harl Bowie and [facility] without cardiac complications. Some confusion has been noted by the family since returning home. Having some weakness in her right leg..   All other systems are reviewed and negative.    PHYSICAL EXAM: VS:  BP 86/54 mmHg  Pulse 78  Ht 5\' 1"  (1.549 m)  Wt 182 lb (82.555 kg)  BMI 34.41 kg/m2  SpO2 96% , BMI Body mass index is 34.41 kg/(m^2). GEN: Well nourished, well developed, in no acute distress HEENT: normal Neck: no JVD, carotid bruits, or masses Cardiac: RRR.  There is no murmur, rub, or gallop. There is no edema. Respiratory:  clear to auscultation bilaterally, normal work of breathing. GI: soft, nontender, nondistended, + BS MS: no deformity or atrophy Skin: warm and dry, no rash Neuro:  Strength and sensation are intact Psych: euthymic mood, full affect   EKG:  EKG is not ordered today.    Recent Labs: 03/16/2015: Magnesium 1.9 04/14/2015: B Natriuretic Peptide 382.8* 06/06/2015: ALT 22; BUN 14; Creatinine, Ser 0.93; Hemoglobin 14.5; Platelets 278; Potassium 4.3; Sodium 141    Lipid Panel    Component Value Date/Time   CHOL 95* 05/29/2015 0929   TRIG 141 05/29/2015 0929   TRIG 466* 05/26/2006 0916   HDL 42* 05/29/2015 0929   CHOLHDL 2.3 05/29/2015 0929   CHOLHDL 5.3 CALC 05/26/2006 0916   VLDL 28 05/29/2015 0929   LDLCALC 25 05/29/2015 0929   LDLDIRECT 123.4 01/07/2014 1204   LDLDIRECT 94.1 05/26/2006 0916      Wt Readings from Last 3 Encounters:  09/08/15 182 lb (82.555 kg)  06/26/15 157 lb (71.215 kg)  04/28/15 146 lb 12.8 oz (66.588 kg)      Other studies Reviewed: Additional studies/ records that were reviewed today include: Reviewed laboratory data gotten recently at [facility]. The findings include kidney function is normal..    ASSESSMENT AND PLAN:  1. CAD in native artery Asymptomatic without angina. Recent lumbar disc  surgery without complications.  2. RBBB Not reassessed  3. Hyperlipidemia LDL goal <70 LDL less than 70 and December on rosuvastatin 5 mg per day    Current medicines are reviewed at length with the patient today.  The patient has the following concerns regarding medicines: none.  The following changes/actions have been instituted:    Decrease metoprolol to 25 mg twice a day  Decrease furosemide to 20 mg per day  Labs/ tests ordered today include:  No orders of the defined types were placed in this encounter.     Disposition:   FU with HS in 5 months  Signed, Sinclair Grooms, MD  09/08/2015 10:12 AM    [address], [place], [place]  [address] Phone: [phone]; Fax: [phone]

## 2015-09-08 NOTE — Patient Instructions (Signed)
Medication Instructions:   START TAKING METOPROLOL 25 MG ONCE A DAY   START TAKING LASIX  20 MG ONCE A DAY   If you need a refill on your cardiac medications before your next appointment, please call your pharmacy.  Labwork:  NONE ORDER TODAY    Testing/Procedures: NONE ORDER TODAY    Follow-Up: Your physician wants you to follow-up in:  IN [place] will receive a reminder letter in the mail two months in advance. If you don't receive a letter, please call our office to schedule the follow-up appointment.      Any Other Special Instructions Will Be Listed Below (If Applicable).

## 2015-10-05 ENCOUNTER — Other Ambulatory Visit: Payer: Self-pay | Admitting: Physician Assistant

## 2015-10-05 NOTE — Telephone Encounter (Signed)
metoprolol succinate (TOPROL-XL) 25 MG 24 hr tablet  Medication   Date: 09/08/2015  Department: [facility]  Ordering/Authorizing: Belva Crome, MD      Order Providers    Prescribing Provider Encounter Provider   Belva Crome, MD Belva Crome, MD    Medication Detail      Disp Refills Start End     metoprolol succinate (TOPROL-XL) 25 MG 24 hr tablet 30 tablet 3 09/08/2015     Sig - Route: Take 1 tablet (25 mg total) by mouth daily. - Oral    Class: No Print     Pharmacy    [id number] - [place], [place] - [address] AT [address]

## 2015-10-16 DIAGNOSIS — Z9181 History of falling: Secondary | ICD-10-CM | POA: Diagnosis not present

## 2015-10-16 DIAGNOSIS — R278 Other lack of coordination: Secondary | ICD-10-CM | POA: Diagnosis not present

## 2015-10-16 DIAGNOSIS — I10 Essential (primary) hypertension: Secondary | ICD-10-CM

## 2015-10-16 DIAGNOSIS — R2681 Unsteadiness on feet: Secondary | ICD-10-CM | POA: Diagnosis not present

## 2015-10-16 DIAGNOSIS — M6281 Muscle weakness (generalized): Secondary | ICD-10-CM | POA: Diagnosis not present

## 2015-12-06 ENCOUNTER — Telehealth: Payer: Self-pay | Admitting: Interventional Cardiology

## 2015-12-06 NOTE — Telephone Encounter (Signed)
New message   Pt wants another prescription for an increases of lasix   Pt was in the hospital and it went from 40 to 20

## 2015-12-06 NOTE — Telephone Encounter (Signed)
Spoke with Lonn Georgia, RN at [facility] regarding call.  She states patient is requesting to take lasix 40 mg daily.  I asked about recent hospital admission and she states patient was admitted at [facility].  She states discharge instructions state patient to receive 20 mg daily and an additional 20 mg as needed.  I advised her that Dr. Tamala Julian last saw the patient in March 2017 and last lab work was December 2016.  I advised her to contact the admitting physician at [facility] for instructions regarding lasix since patient has not been seen here recently.  Kayla verbalized understanding and agreement.

## 2015-12-18 ENCOUNTER — Encounter: Payer: Self-pay | Admitting: Family Medicine

## 2015-12-18 ENCOUNTER — Ambulatory Visit (INDEPENDENT_AMBULATORY_CARE_PROVIDER_SITE_OTHER): Payer: BLUE CROSS/BLUE SHIELD | Admitting: Family Medicine

## 2015-12-18 VITALS — BP 90/60 | HR 112 | Temp 98.3°F | Resp 18 | Ht 61.0 in | Wt 155.0 lb

## 2015-12-18 DIAGNOSIS — R062 Wheezing: Secondary | ICD-10-CM

## 2015-12-18 DIAGNOSIS — R059 Cough, unspecified: Secondary | ICD-10-CM

## 2015-12-18 DIAGNOSIS — R05 Cough: Secondary | ICD-10-CM

## 2015-12-18 MED ORDER — LEVOFLOXACIN 500 MG PO TABS: 500.0000 mg | ORAL_TABLET | Freq: Every day | ORAL | Status: DC

## 2015-12-18 MED ORDER — ALBUTEROL SULFATE HFA 108 (90 BASE) MCG/ACT IN AERS: 2.0000 | INHALATION_SPRAY | RESPIRATORY_TRACT | Status: AC | PRN

## 2015-12-18 MED ORDER — METHYLPREDNISOLONE ACETATE 80 MG/ML IJ SUSP
80.0000 mg | Freq: Once | INTRAMUSCULAR | Status: AC
  Administered 2015-12-18: 80 mg via INTRAMUSCULAR

## 2015-12-18 NOTE — Patient Instructions (Signed)
Follow up for any fever or increased shortness of breath. 

## 2015-12-18 NOTE — Progress Notes (Signed)
Subjective:    Patient ID: Catherine Craig, female    DOB: [birth year], 60 y.o.   MRN: [id number]  HPI  Acute visit for respiratory illness. Onset one week ago today. She just got out Wednesday from rehabilitation stay after having recent back surgery about a month ago. She states she started having productive cough of green sputum about one week ago. She states that her temperature has been ranging between normal and 99.9 over the past week She denies any worsening back pain.  She's had some progressive dyspnea with activity and wheezing. Still smokes. No hemoptysis. Not currently on any inhalers. She has some persistent nasal congestion. No sore throat. No dyspnea at rest. No dysuria.  Past Medical History  Diagnosis Date  . ADHD   . Allergic rhinitis   . Basal cell carcinoma   . Fibromyalgia   . GERD   . Dyslipidemia   . Essential hypertension   . MENOPAUSAL SYNDROME 12/15/2006  . Osteoarthritis   . Renal colic A999333  . VAGINITIS, ATROPHIC 01/05/2007  . Histoplasmosis with retinitis   . Anxiety   . IBS (irritable bowel syndrome)   . Anal fissure   . Thoracic outlet syndrome   . Lipoma     on chest  . CAD (coronary artery disease)     a. Inferior STEMI -  initial cath 02/11/15 revealed 80% mid LAD, 70% D1, chronically occluded mid LCX, 50% mid RCA, 70% distal RCA, acute occlusion of prox RCA s/p DES to prox RCA. Planned re-cath 02/16/15 s/p DES to mLAD, DES to distal RCA. Med rx for chronically occluded mLCx.  . Cardiogenic shock ([place])     a. 01/2015 in setting of inferior STEMI, possibly 2/2 cardiogenic shock.  . NSVT (nonsustained ventricular tachycardia) ([place])     a. 6 beats 01/2015 after STEMI in setting of low K, Mg.  . Tobacco abuse   . Normocytic anemia   . Valvular heart disease     a. Mild AI/MR, mod TR by echo 02/12/15.  Marland Kitchen Hypokalemia   . Hypomagnesemia   . Leukocytosis   . Acute kidney injury ([place])   . Chronic diastolic CHF (congestive heart failure) ([place])    . RBBB    Past Surgical History  Procedure Laterality Date  . Appendectomy  1973  . Tonsillectomy  1980  . Dilation and curettage of uterus  1982  . Tonsillectomy    . Eye surgery      Lazer to right x 6  . Spine surgery  04/16/2012  . Cardiac catheterization N/A 02/11/2015    Procedure: Left Heart Cath and Coronary Angiography;  Surgeon: Belva Crome, MD;  Location: [place] [facility];  Service: Cardiovascular;  Laterality: N/A;  . Cardiac catheterization N/A 02/11/2015    Procedure: Coronary Stent Intervention;  Surgeon: Belva Crome, MD;  Location: [place] [facility];  Service: Cardiovascular;  Laterality: N/A;  . Cardiac catheterization N/A 02/16/2015    Procedure: Coronary Stent Intervention;  Surgeon: Peter M Martinique, MD;  Location: [place] [facility];  Service: Cardiovascular;  Laterality: N/A;  . Coronary stent placement      reports that she has been smoking Cigarettes.  She has a 15 pack-year smoking history. She has never used smokeless tobacco. She reports that she drinks alcohol. She reports that she does not use illicit drugs. family history includes ADD / ADHD in her brother; CAD in her father; COPD in her mother; Crohn's disease in her daughter; Heart attack (  age of onset: 50) in her paternal grandfather; Stroke in her other; Vascular Disease in her mother. Allergies  Allergen Reactions  . Adderall [Amphetamine-Dextroamphetamine] Other (See Comments)    High heart rate, stops taking for surgeries  . Bee Venom Swelling  . Fluorescein Hives  . Hornet Venom Swelling  . Lyrica [Pregabalin] Swelling  . Chantix [Varenicline Tartrate] Nausea And Vomiting     Review of Systems  Constitutional: Negative for fever and chills.  HENT: Positive for congestion. Negative for sore throat.   Respiratory: Positive for cough, shortness of breath and wheezing.   Cardiovascular: Negative for chest pain.  Gastrointestinal: Negative for nausea, vomiting and abdominal pain.    Genitourinary: Negative for dysuria.  Neurological: Negative for dizziness.  Psychiatric/Behavioral: Negative for confusion.       Objective:   Physical Exam  Constitutional: She appears well-developed and well-nourished.  HENT:  Right Ear: External ear normal.  Left Ear: External ear normal.  Mouth/Throat: Oropharynx is clear and moist.  Neck: Neck supple.  Cardiovascular: Normal rate and regular rhythm.   Pulmonary/Chest: Effort normal. No respiratory distress. She has wheezes. She has no rales.  Patient has diffuse wheezes. No rales. Pulse oximetry 91%. Normal respiratory rate. No respiratory distress.  Musculoskeletal: She exhibits no edema.  Lymphadenopathy:    She has no cervical adenopathy.          Assessment & Plan:  Acute respiratory illness in a patient with recent prolonged rehabilitation stay. She does not have any diagnosis of COPD but is a long-term smoker with current nicotine use. She does have evidence for reactive airway component but is in no respiratory distress.  Depo-Medrol 80 mg IM given. Prescription for albuterol inhaler 2 use every 4-6 hours as needed. Start Levaquin 500 milligrams once daily for 10 days. Follow-up promptly for any fever or worsening dyspnea  Eulas Post MD [facility] at [facility]

## 2015-12-18 NOTE — Progress Notes (Signed)
Pre visit review using our clinic review tool, if applicable. No additional management support is needed unless otherwise documented below in the visit note. 

## 2016-01-05 ENCOUNTER — Other Ambulatory Visit: Payer: Self-pay | Admitting: Physician Assistant

## 2016-01-10 NOTE — Progress Notes (Signed)
Cardiology Office Note    Date:  01/11/2016   ID:  Catherine Craig, DOB 1955-08-23, MRN [id number]  PCP:  Nyoka Cowden, MD  Cardiologist:  Dr. Tamala Julian  CC: 6 month follow up  History of Present Illness:  Catherine Craig is a 60 y.o. female with a history of  CAD (inferior STEMI 02/2015 s/p DES to prox RCA with staged DES to mLAD & DES to distal RCA, med rx for chronically occluded mLCx), HTN, RBBB, dyslipidemia, tobacco abuse, normocytic anemia, chronic back pain, mild AI/MR and mod TR by echo 01/2015, and chronic diastolic CHF who presents to clinic for 6 month follow up.   Last seen by Dr Tamala Julian in 08/2015. Doing well. Metoprolol was cut back due to hypotension (BP 86/54 in office that day.)   Since that time she was admitted to 10/2015 at [facility] for loss of bowel and bladder control and falling. They found a cyst on her spine. She underwent back surgery x2 in march and may. She has since regained function of her legs and can walk with a cane. After the surgery she had one episode of chest pain with jaw pain. This was resolved with 1 SL NTG. She has not had any pain since.   Today presents for 6 month follow up. She has been working with PT and has had no exertional symptoms. No LE edema but she does have to take an extra lasix from time to time. No orthopnea or PND. No dizziness or syncope. On gabapentin and tizanidine for neuropathy and spasms. Otherwise doing quite well. Trying to cut back on smoking. Not living with a smoker anymore, thinks this will help.    Past Medical History:  Diagnosis Date  . Acute kidney injury ([place])   . ADHD   . Allergic rhinitis   . Anal fissure   . Anxiety   . Basal cell carcinoma   . CAD (coronary artery disease)    a. Inferior STEMI -  initial cath 02/11/15 revealed 80% mid LAD, 70% D1, chronically occluded mid LCX, 50% mid RCA, 70% distal RCA, acute occlusion of prox RCA s/p DES to prox RCA. Planned re-cath 02/16/15 s/p DES to mLAD, DES to distal RCA.  Med rx for chronically occluded mLCx.  . Cardiogenic shock ([place])    a. 01/2015 in setting of inferior STEMI, possibly 2/2 cardiogenic shock.  . Chronic diastolic CHF (congestive heart failure) ([place])   . Dyslipidemia   . Essential hypertension   . Fibromyalgia   . GERD   . Histoplasmosis with retinitis   . Hypokalemia   . Hypomagnesemia   . IBS (irritable bowel syndrome)   . Leukocytosis   . Lipoma    on chest  . MENOPAUSAL SYNDROME 12/15/2006  . Normocytic anemia   . NSVT (nonsustained ventricular tachycardia) ([facility])    a. 6 beats 01/2015 after STEMI in setting of low K, Mg.  . Osteoarthritis   . RBBB   . Renal colic [id number]  . Thoracic outlet syndrome   . Tobacco abuse   . VAGINITIS, ATROPHIC 01/05/2007  . Valvular heart disease    a. Mild AI/MR, mod TR by echo 02/12/15.    Past Surgical History:  Procedure Laterality Date  . APPENDECTOMY  1973  . CARDIAC CATHETERIZATION N/A 02/11/2015   Procedure: Left Heart Cath and Coronary Angiography;  Surgeon: Belva Crome, MD;  Location: [place] [facility];  Service: Cardiovascular;  Laterality: N/A;  . CARDIAC CATHETERIZATION N/A 02/11/2015   Procedure:  Coronary Stent Intervention;  Surgeon: Belva Crome, MD;  Location: [place] [facility];  Service: Cardiovascular;  Laterality: N/A;  . CARDIAC CATHETERIZATION N/A 02/16/2015   Procedure: Coronary Stent Intervention;  Surgeon: Peter M Martinique, MD;  Location: [place] [facility];  Service: Cardiovascular;  Laterality: N/A;  . CORONARY STENT PLACEMENT    . DILATION AND CURETTAGE OF UTERUS  1982  . EYE SURGERY     Lazer to right x 6  . SPINE SURGERY  04/16/2012  . TONSILLECTOMY  1980  . TONSILLECTOMY      Current Medications: Outpatient Medications Prior to Visit  Medication Sig Dispense Refill  . albuterol (PROVENTIL HFA;VENTOLIN HFA) 108 (90 Base) MCG/ACT inhaler Inhale 2 puffs into the lungs every 4 (four) hours as needed for wheezing or shortness of breath. 1 Inhaler 1  . aspirin EC  81 MG tablet Take 1 tablet (81 mg total) by mouth daily. 30 tablet 11  . B Complex-Folic Acid (SUPER B COMPLEX MAXI PO) Take 1 capsule by mouth at bedtime.     . Cholecalciferol (VITAMIN D PO) Take 2,000 Units by mouth 2 (two) times daily.     . clopidogrel (PLAVIX) 75 MG tablet Take 1 tablet (75 mg total) by mouth daily. 30 tablet 10  . Coenzyme Q10 (COQ-10) 200 MG CAPS Take 1 tablet by mouth 2 (two) times daily.    . furosemide (LASIX) 20 MG tablet Take 20 mg by mouth daily.    Marland Kitchen KLOR-CON M20 20 MEQ tablet TAKE 2 TABLETS (40 MEQ TOTAL) BY MOUTH DAILY. 60 tablet 6  . magnesium oxide (MAG-OX) 400 (241.3 Mg) MG tablet TAKE 1 TABLET (400 MG TOTAL) BY MOUTH DAILY. 90 tablet 3  . metoprolol succinate (TOPROL-XL) 25 MG 24 hr tablet Take 1 tablet (25 mg total) by mouth daily. 30 tablet 3  . Multiple Vitamin (MULTIVITAMIN WITH MINERALS) TABS Take 0.5 tablets by mouth 2 (two) times daily.    . nitroGLYCERIN (NITROSTAT) 0.4 MG SL tablet Place 1 tablet (0.4 mg total) under the tongue every 5 (five) minutes as needed for chest pain (up to 3 doses). 25 tablet 3  . rosuvastatin (CRESTOR) 5 MG tablet Take 1 tablet (5 mg total) by mouth every evening. 30 tablet 5  . sodium chloride (OCEAN) 0.65 % SOLN nasal spray Place 1 spray into both nostrils daily as needed for congestion.    . cyclobenzaprine (FLEXERIL) 10 MG tablet Take 1 tablet (10 mg total) by mouth 3 (three) times daily as needed for muscle spasms (also 20 mg at night as needed). (Patient not taking: Reported on 01/11/2016) 30 tablet 2  . gabapentin (NEURONTIN) 400 MG capsule Take 1 capsule (400 mg total) by mouth 2 (two) times daily. (Patient not taking: Reported on 01/11/2016) 180 capsule 1  . HYDROcodone-acetaminophen (NORCO) 10-325 MG tablet Take 1 tablet by mouth every 8 (eight) hours as needed. (Patient not taking: Reported on 01/11/2016) 90 tablet 0  . levofloxacin (LEVAQUIN) 500 MG tablet Take 1 tablet (500 mg total) by mouth daily. (Patient not  taking: Reported on 01/11/2016) 10 tablet 0  . magnesium oxide (MAG-OX) 400 (241.3 Mg) MG tablet TAKE 1 TABLET (400 MG TOTAL) BY MOUTH DAILY. (Patient not taking: Reported on 01/11/2016) 30 tablet 0  . potassium chloride SA (K-DUR,KLOR-CON) 20 MEQ tablet Take 1 tablet (20 mEq total) by mouth daily. (Patient not taking: Reported on 01/11/2016)    . vitamin C (ASCORBIC ACID) 500 MG tablet Take 500 mg by  mouth 2 (two) times daily.     No facility-administered medications prior to visit.      Allergies:   Adderall [amphetamine-dextroamphetamine]; Bee venom; Fluorescein; Hornet venom; Lyrica [pregabalin]; and Chantix [varenicline tartrate]   Social History   Social History  . Marital status: Married    Spouse name: N/A  . Number of children: N/A  . Years of education: N/A   Social History Main Topics  . Smoking status: Current Some Day Smoker    Packs/day: 0.50    Years: 30.00    Types: Cigarettes    Last attempt to quit: 02/24/2015  . Smokeless tobacco: Never Used     Comment: using electronic cigarette as of 03/2012  . Alcohol use 0.0 oz/week     Comment: social  . Drug use: No  . Sexual activity: Not Asked   Other Topics Concern  . None   Social History Narrative  . None     Family History:  The patient's family history includes ADD / ADHD in her brother; CAD in her father; COPD in her mother; Crohn's disease in her daughter; Heart attack (age of onset: 77) in her paternal grandfather; Stroke in her other; Vascular Disease in her mother.     ROS:   Please see the history of present illness.    ROS All other systems reviewed and are negative.   PHYSICAL EXAM:   VS:  BP 104/70   Pulse 82   Ht 5\' 3"  (1.6 m)   Wt 154 lb 1.9 oz (69.9 kg)   BMI 27.30 kg/m    GEN: Well nourished, well developed, in no acute distress  HEENT: normal  Neck: no JVD, carotid bruits, or masses Cardiac: RRR; no murmurs, rubs, or gallops,no edema  Respiratory:  clear to auscultation bilaterally,  normal work of breathing GI: soft, nontender, nondistended, + BS MS: no deformity or atrophy  Skin: warm and dry, no rash Neuro:  Alert and Oriented x 3, Strength and sensation are intact Psych: euthymic mood, full affect  Wt Readings from Last 3 Encounters:  01/11/16 154 lb 1.9 oz (69.9 kg)  12/18/15 155 lb (70.3 kg)  09/08/15 182 lb (82.6 kg)      Studies/Labs Reviewed:   EKG:  EKG is ordered today.  The ekg ordered today demonstrates Sinus with RBBB HR 81  Recent Labs: 03/16/2015: Magnesium 1.9 04/14/2015: B Natriuretic Peptide 382.8 06/06/2015: ALT 22; BUN 14; Creatinine, Ser 0.93; Hemoglobin 14.5; Platelets 278; Potassium 4.3; Sodium 141   Lipid Panel    Component Value Date/Time   CHOL 95 (L) 05/29/2015 0929   TRIG 141 05/29/2015 0929   TRIG 466 (HH) 05/26/2006 0916   HDL 42 (L) 05/29/2015 0929   CHOLHDL 2.3 05/29/2015 0929   VLDL 28 05/29/2015 0929   LDLCALC 25 05/29/2015 0929   LDLDIRECT 123.4 01/07/2014 1204    Additional studies/ records that were reviewed today include:  2D ECHO: 02/12/2015 LV EF: 60% -   65% Study Conclusions - Left ventricle: The cavity size was normal. Wall thickness was   normal. Systolic function was normal. The estimated ejection   fraction was in the range of 60% to 65%. Features are consistent   with a pseudonormal left ventricular filling pattern, with   concomitant abnormal relaxation and increased filling pressure   (grade 2 diastolic dysfunction). Doppler parameters are   consistent with high ventricular filling pressure. Ratio of   mitral valve peak E velocity to medial annulus E (e&') velocity:  19.4. - Regional wall motion abnormality: Mild hypokinesis of the mid   inferoseptal and mid inferior myocardium. - Aortic valve: There was mild regurgitation. - Mitral valve: There was mild regurgitation. - Left atrium: The atrium was mildly dilated. - Right ventricle: Systolic function was mildly reduced. - Tricuspid valve:  There was moderate regurgitation.  [facility] 02/16/16 Procedures  Coronary Stent Intervention  Conclusion   Mid Cx to Dist Cx lesion, 100% stenosed.  Mid RCA lesion, 50% stenosed.  A drug-eluting stent was placed.  1st Diag lesion, 40% stenosed.  Dist RCA lesion, 70% stenosed. There is a 0% residual stenosis post intervention.  A drug-eluting stent was placed.  Mid LAD lesion, 80% stenosed. There is a 0% residual stenosis post intervention.  A drug-eluting stent was placed.        ASSESSMENT & PLAN:   CAD: s/p Inferior STEMI (01/2015) treated with PCI + DES to proximal and distal RCA. Also with staged PCI + DES to mid LAD; med rx for chronically occluded mLCx. EKG today demonstrates no ischemic abnormalities. She denies any recurrent anginal symptoms. Continue dual antiplatelet therapy with aspirin plus Brilinta. Statin therapy with Crestor and beta blocker therapy with Metroprolol.  Chronic diastolic CHF: She is euvolemic on physical exam today.Continue 20 mg of Lasix daily. We discussed the importance of adhering to a low-sodium diet. We also discussed the importance of daily weights to monitor for fluid retention.  Hyperlipidemia: LDL is at goal 24 mg/dL (05/2015). Continue statin therapy with Crestor.  Tobacco abuse: still smoking. She tried chanix and this seemed to help but made her a little sick to her stomach. Would like to try this again.   Hypertension: BPs have been running low over the past year or so and Metoporlol has been decreased. Today soft but stable   Dispo: she is moving to [place] to be closer to family. When she finds a cardiology practice there we will send her records.   Medication Adjustments/Labs and Tests Ordered: Current medicines are reviewed at length with the patient today.  Concerns regarding medicines are outlined above.  Medication changes, Labs and Tests ordered today are listed in the Patient Instructions below. Patient Instructions    Medication Instructions:  Your physician recommends that you continue on your current medications as directed. Please refer to the Current Medication list given to you today.   Labwork: None ordered  Testing/Procedures: None ordered  Follow-Up: Your physician recommends that you schedule a follow-up appointment in:    Any Other Special Instructions Will Be Listed Below (If Applicable).     If you need a refill on your cardiac medications before your next appointment, please call your pharmacy.      Signed, Angelena Form, PA-C  01/11/2016 2:40 PM    [facility] [address], [place], [place]  [address] Phone: [phone]; Fax: [phone]

## 2016-01-11 ENCOUNTER — Encounter: Payer: Self-pay | Admitting: Physician Assistant

## 2016-01-11 ENCOUNTER — Encounter (INDEPENDENT_AMBULATORY_CARE_PROVIDER_SITE_OTHER): Payer: Self-pay

## 2016-01-11 ENCOUNTER — Ambulatory Visit (INDEPENDENT_AMBULATORY_CARE_PROVIDER_SITE_OTHER): Payer: BLUE CROSS/BLUE SHIELD | Admitting: Physician Assistant

## 2016-01-11 VITALS — BP 104/70 | HR 82 | Ht 63.0 in | Wt 154.1 lb

## 2016-01-11 DIAGNOSIS — I10 Essential (primary) hypertension: Secondary | ICD-10-CM

## 2016-01-11 DIAGNOSIS — I2510 Atherosclerotic heart disease of native coronary artery without angina pectoris: Secondary | ICD-10-CM | POA: Diagnosis not present

## 2016-01-11 DIAGNOSIS — I2119 ST elevation (STEMI) myocardial infarction involving other coronary artery of inferior wall: Secondary | ICD-10-CM | POA: Diagnosis not present

## 2016-01-11 DIAGNOSIS — Z720 Tobacco use: Secondary | ICD-10-CM

## 2016-01-11 DIAGNOSIS — E785 Hyperlipidemia, unspecified: Secondary | ICD-10-CM | POA: Diagnosis not present

## 2016-01-11 DIAGNOSIS — I5031 Acute diastolic (congestive) heart failure: Secondary | ICD-10-CM

## 2016-01-11 MED ORDER — VARENICLINE TARTRATE 1 MG PO TABS: 1.0000 mg | ORAL_TABLET | Freq: Two times a day (BID) | ORAL | 0 refills | Status: AC

## 2016-01-11 NOTE — Patient Instructions (Addendum)
Medication Instructions:  Your physician has recommended you make the following change in your medication:  1.  START the Chantix starter kit.  Take as directed then there is a prescription at your pharmacy  Labwork: None ordered  Testing/Procedures: None ordered  Follow-Up: CALL us AT THE NUMBER LISTED ABOVE AND ASK FOR OUR MEDICAL RECORDS DEPT AND THEY WILL GET YOUR RECORDS TRANSFERRED.   Any Other Special Instructions Will Be Listed Below (If Applicable).     If you need a refill on your cardiac medications before your next appointment, please call your pharmacy.

## 2016-01-29 ENCOUNTER — Other Ambulatory Visit: Payer: Self-pay | Admitting: *Deleted

## 2016-01-29 MED ORDER — ROSUVASTATIN CALCIUM 5 MG PO TABS: 5.0000 mg | ORAL_TABLET | Freq: Every evening | ORAL | 11 refills | Status: DC

## 2016-02-02 ENCOUNTER — Other Ambulatory Visit: Payer: Self-pay | Admitting: Interventional Cardiology

## 2016-02-03 IMAGING — DX DG CHEST 2V
2 series · 2 of 2 positions shown · non-contrast
Comparison: PA and lateral chest 03/07/2015 and 10/28/2011.

CLINICAL DATA: Shortness breath and wheezing last night. Initial
encounter.

EXAM:
CHEST  2 VIEW

[chest pa]
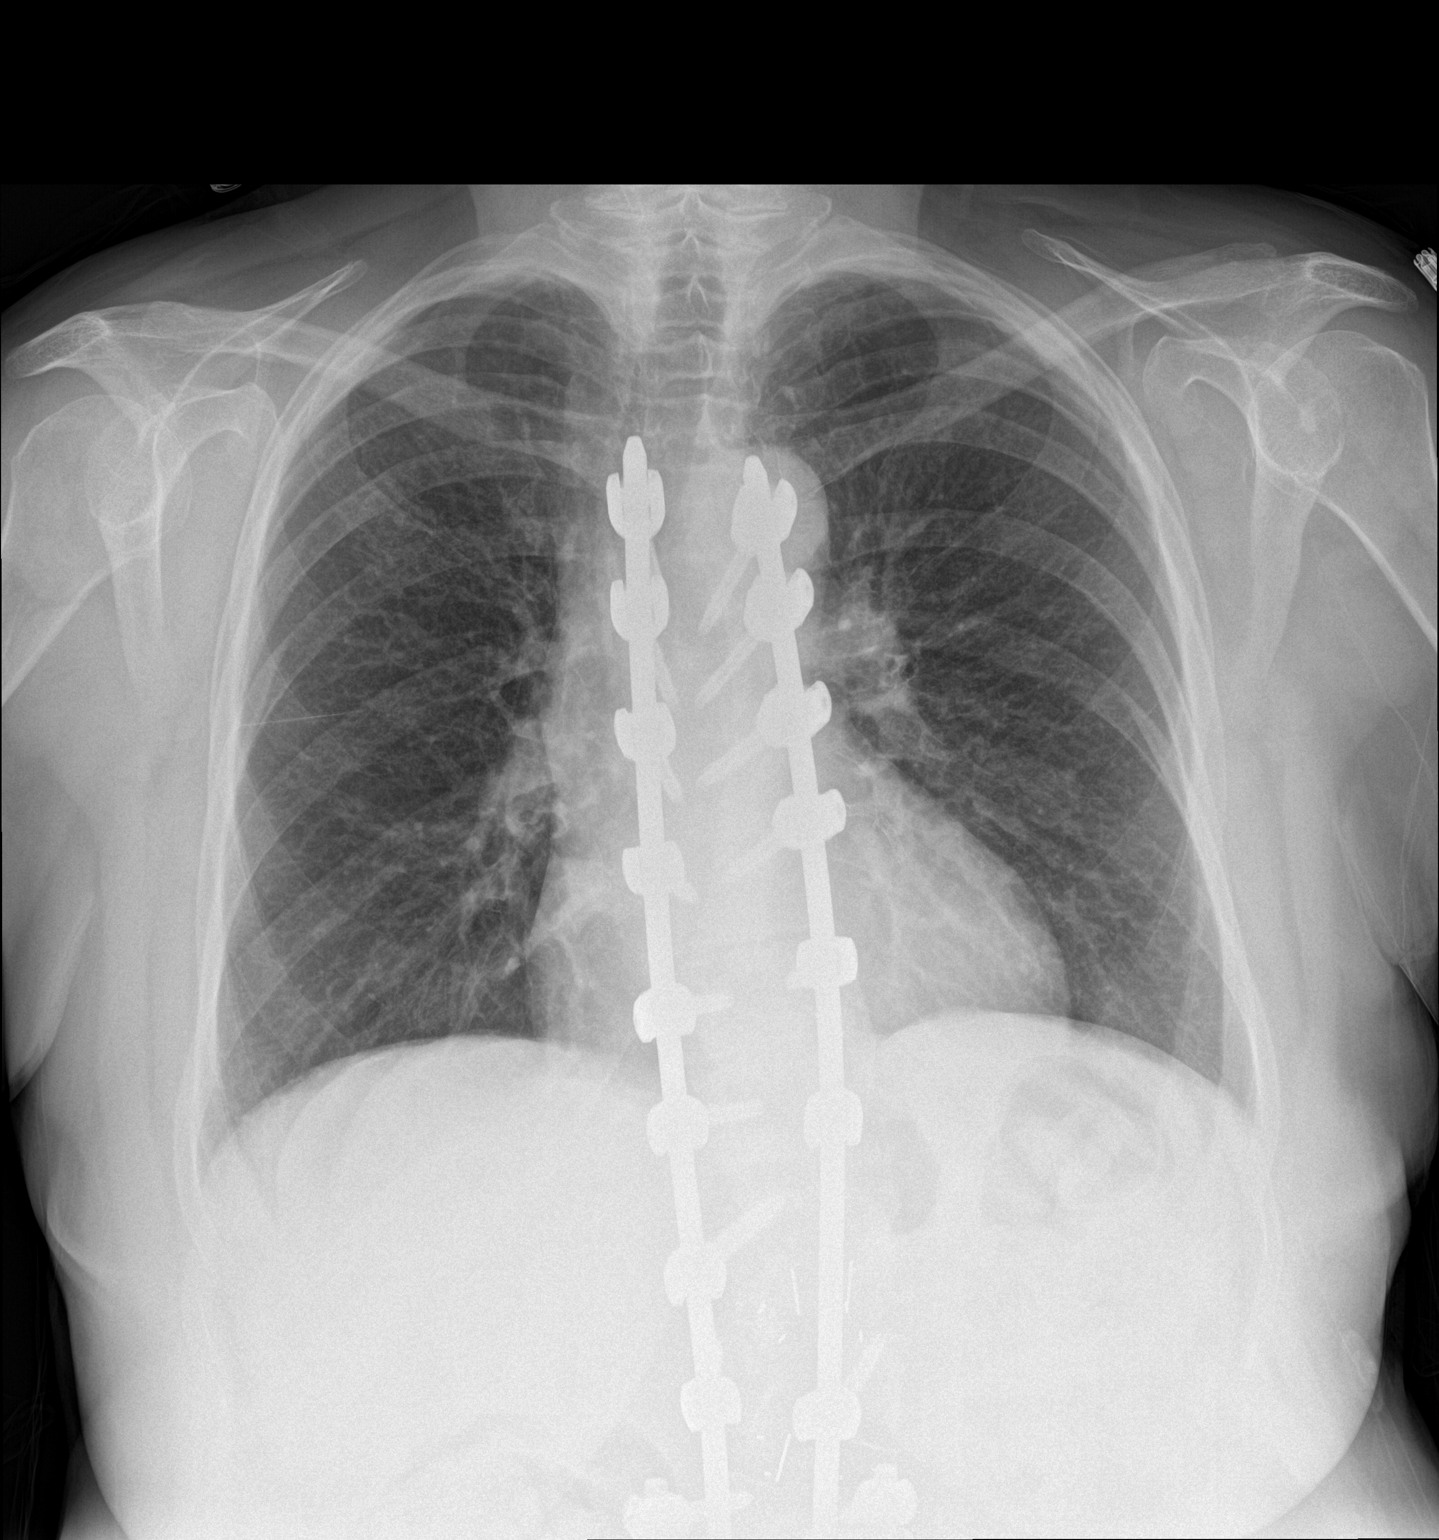

[chest lat]
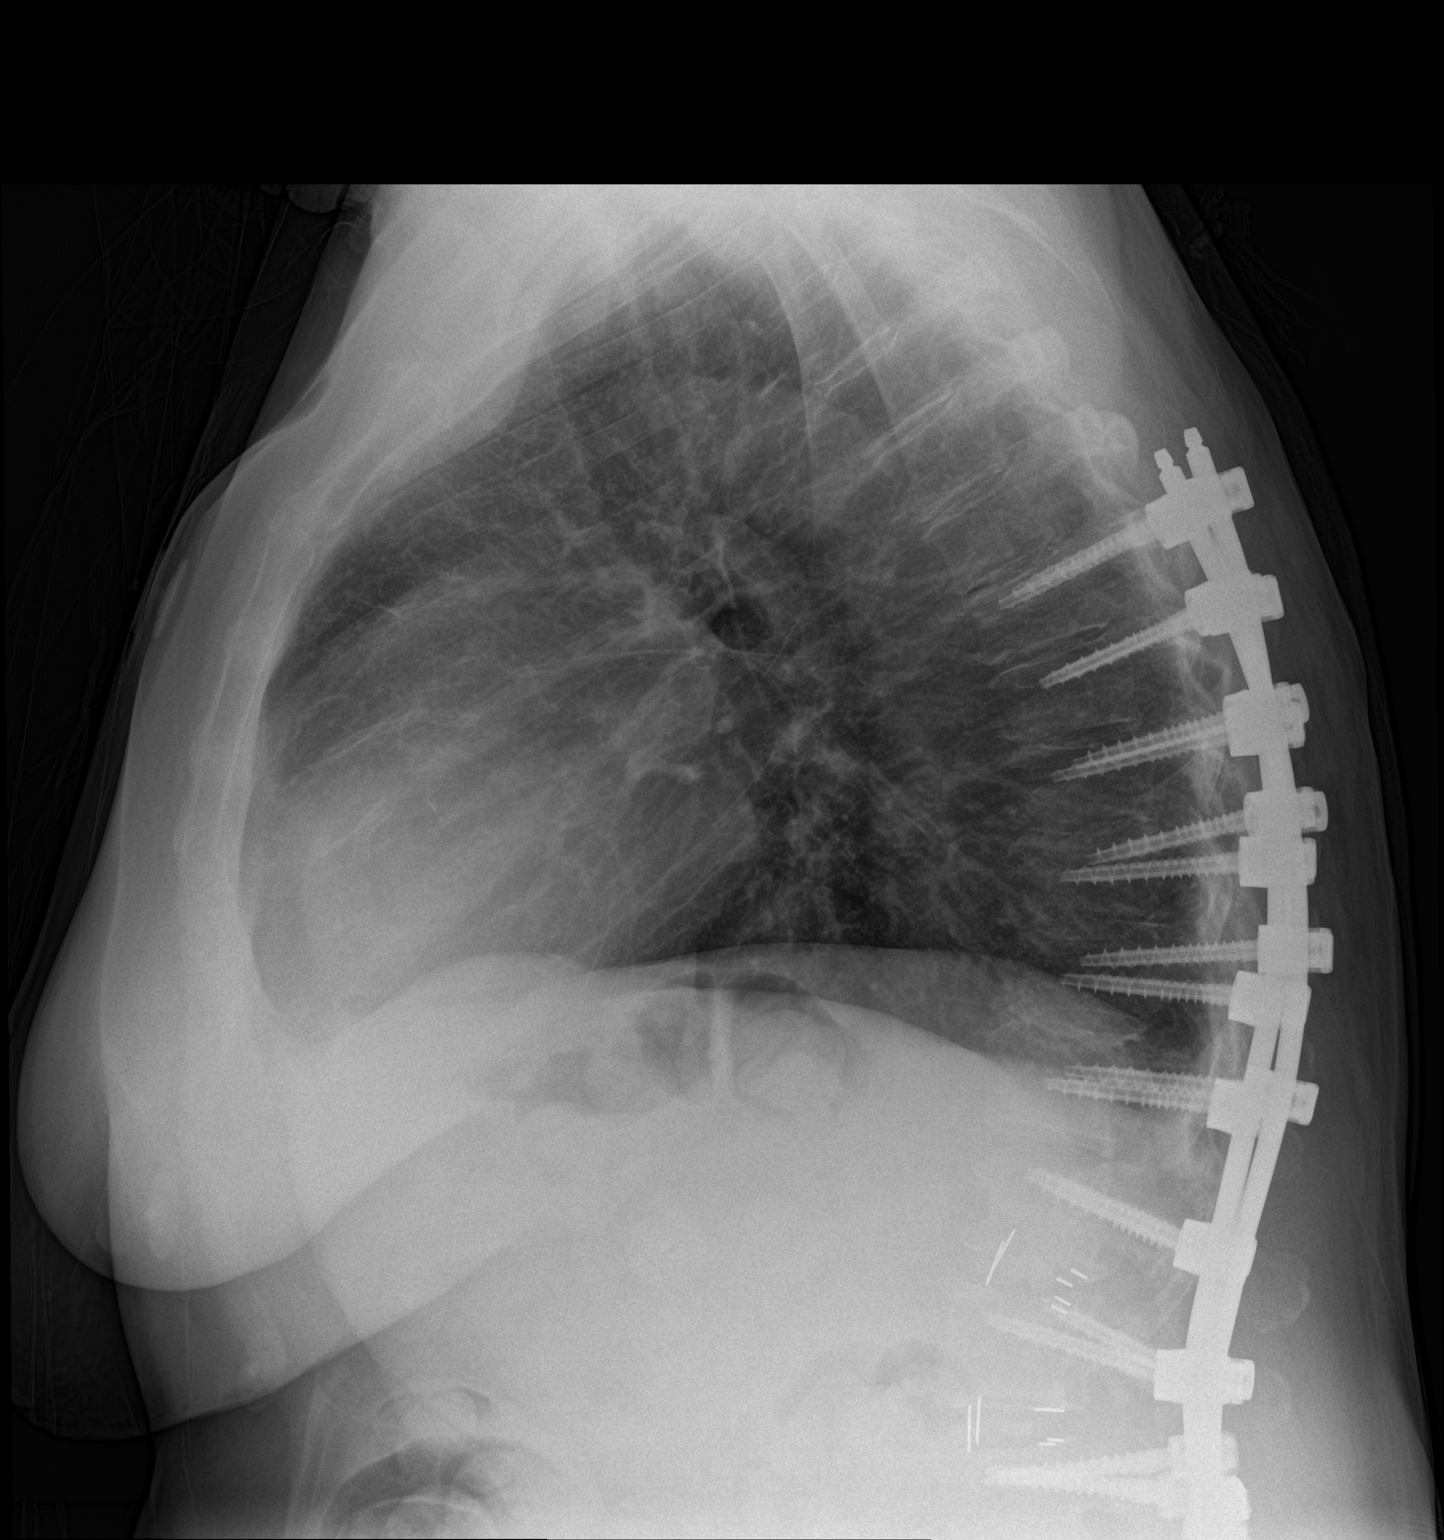

[2 of 2 positions shown; findings below may reference images not displayed]

FINDINGS: The lungs are clear. Heart size is normal. No pneumothorax or
pleural effusion. Extensive spinal fusion hardware is again seen.
IMPRESSION: No acute disease.  Stable compared to prior exam.

## 2016-02-20 ENCOUNTER — Telehealth: Payer: Self-pay | Admitting: Internal Medicine

## 2016-02-20 NOTE — Telephone Encounter (Signed)
Okay for physical therapy.

## 2016-02-20 NOTE — Telephone Encounter (Signed)
Okay for Physical Therapy for pt? 

## 2016-02-20 NOTE — Telephone Encounter (Signed)
°  Catherine Craig call from [facility] to ask for in home physical therapy orders .    [phone]

## 2016-02-20 NOTE — Telephone Encounter (Signed)
Spoke to [facility], told her okay for PT orders for pt per Dr.K. Caryl Ada said she is looking for orders to be signed that were faxed over July 18 th. Told her I do not have any orders please fax them again. Caryl Ada verbalized understanding and will fax again.

## 2016-02-21 NOTE — Telephone Encounter (Signed)
Orders received and given to Dr.K to sign. He put back in basket to fax over after he signed.

## 2016-03-03 IMAGING — CR DG CHEST 2V
2 series · 2 of 2 positions shown · non-contrast
Comparison: March 16, 2015

CLINICAL DATA: Shortness of breath for several days

EXAM:
CHEST  2 VIEW

[chest pa]
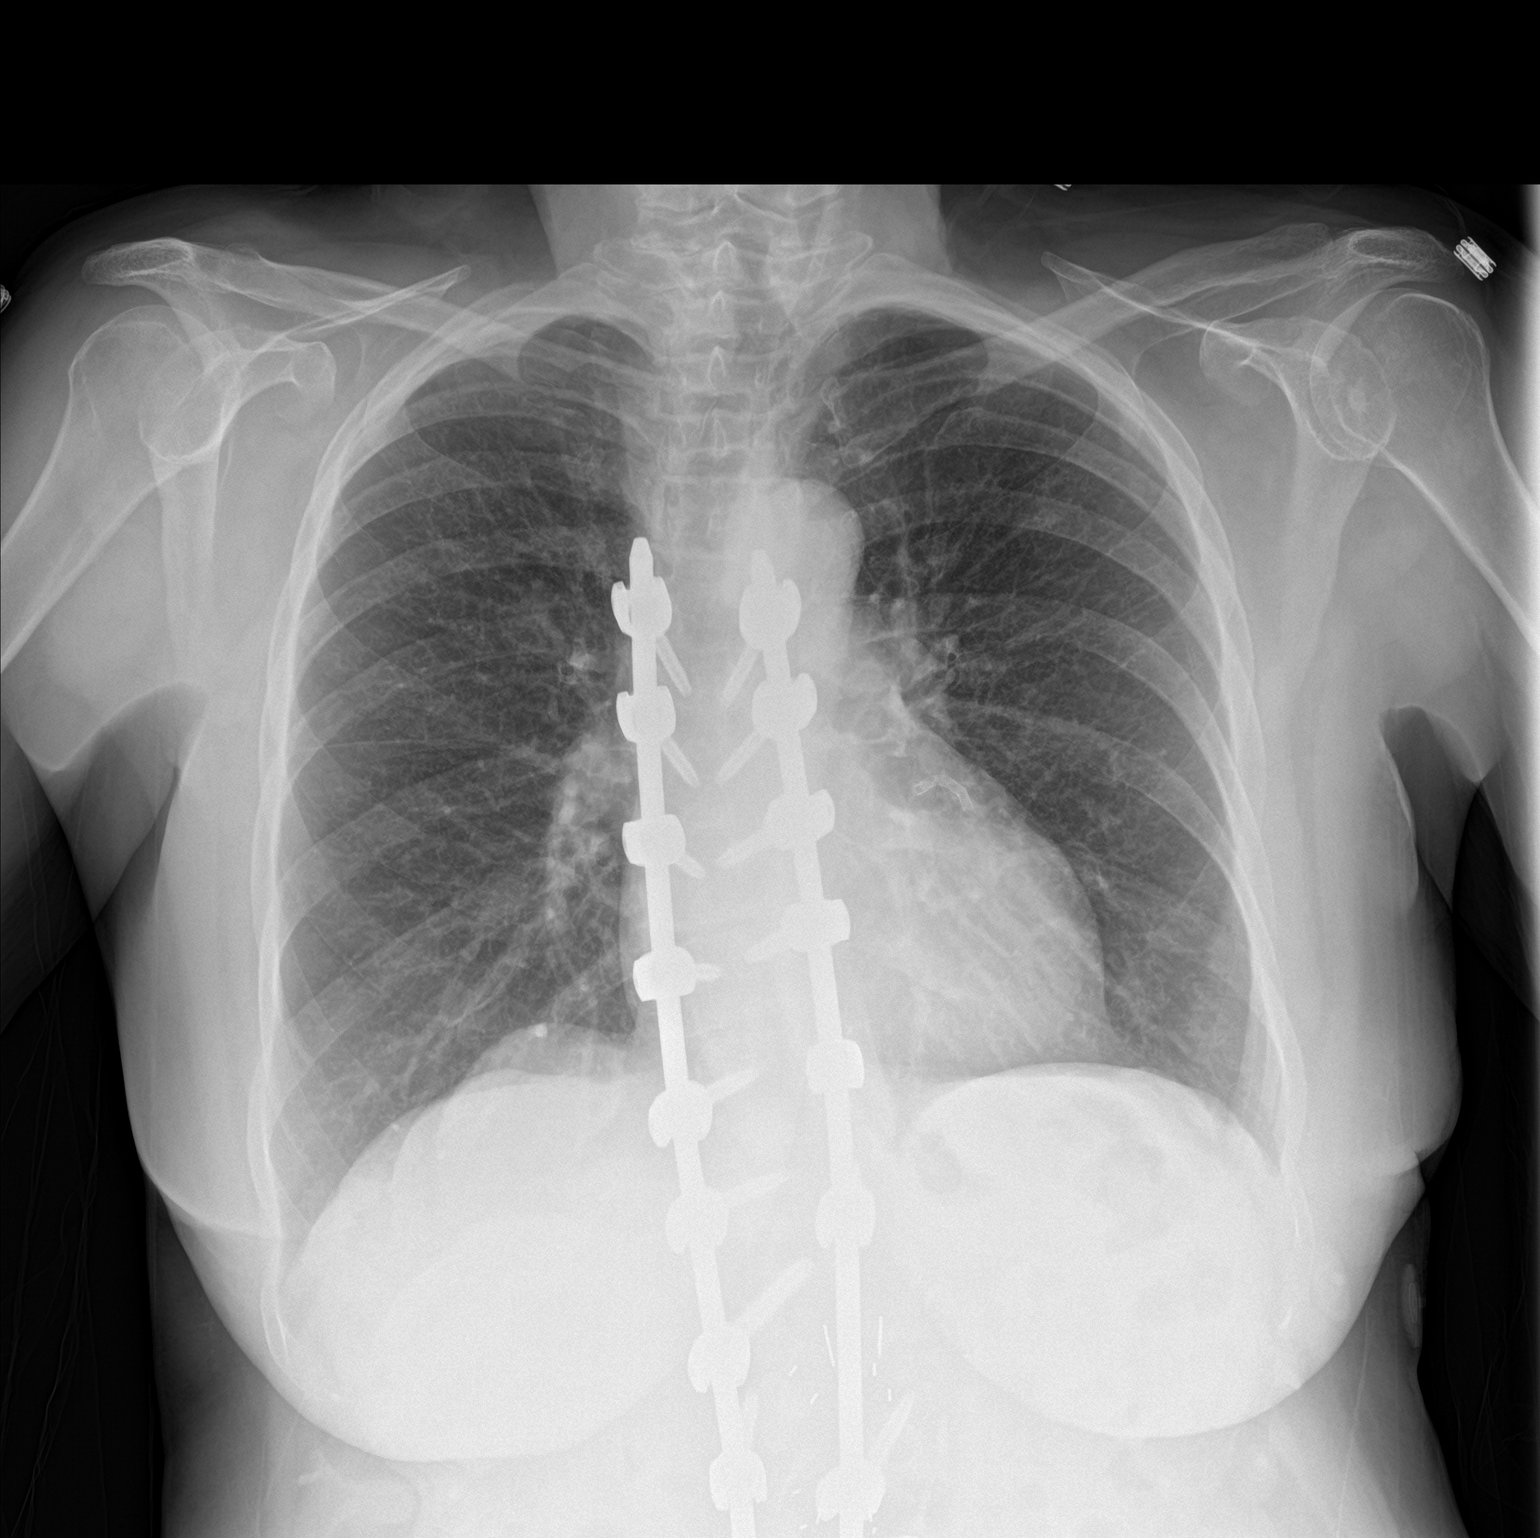

[chest lat]
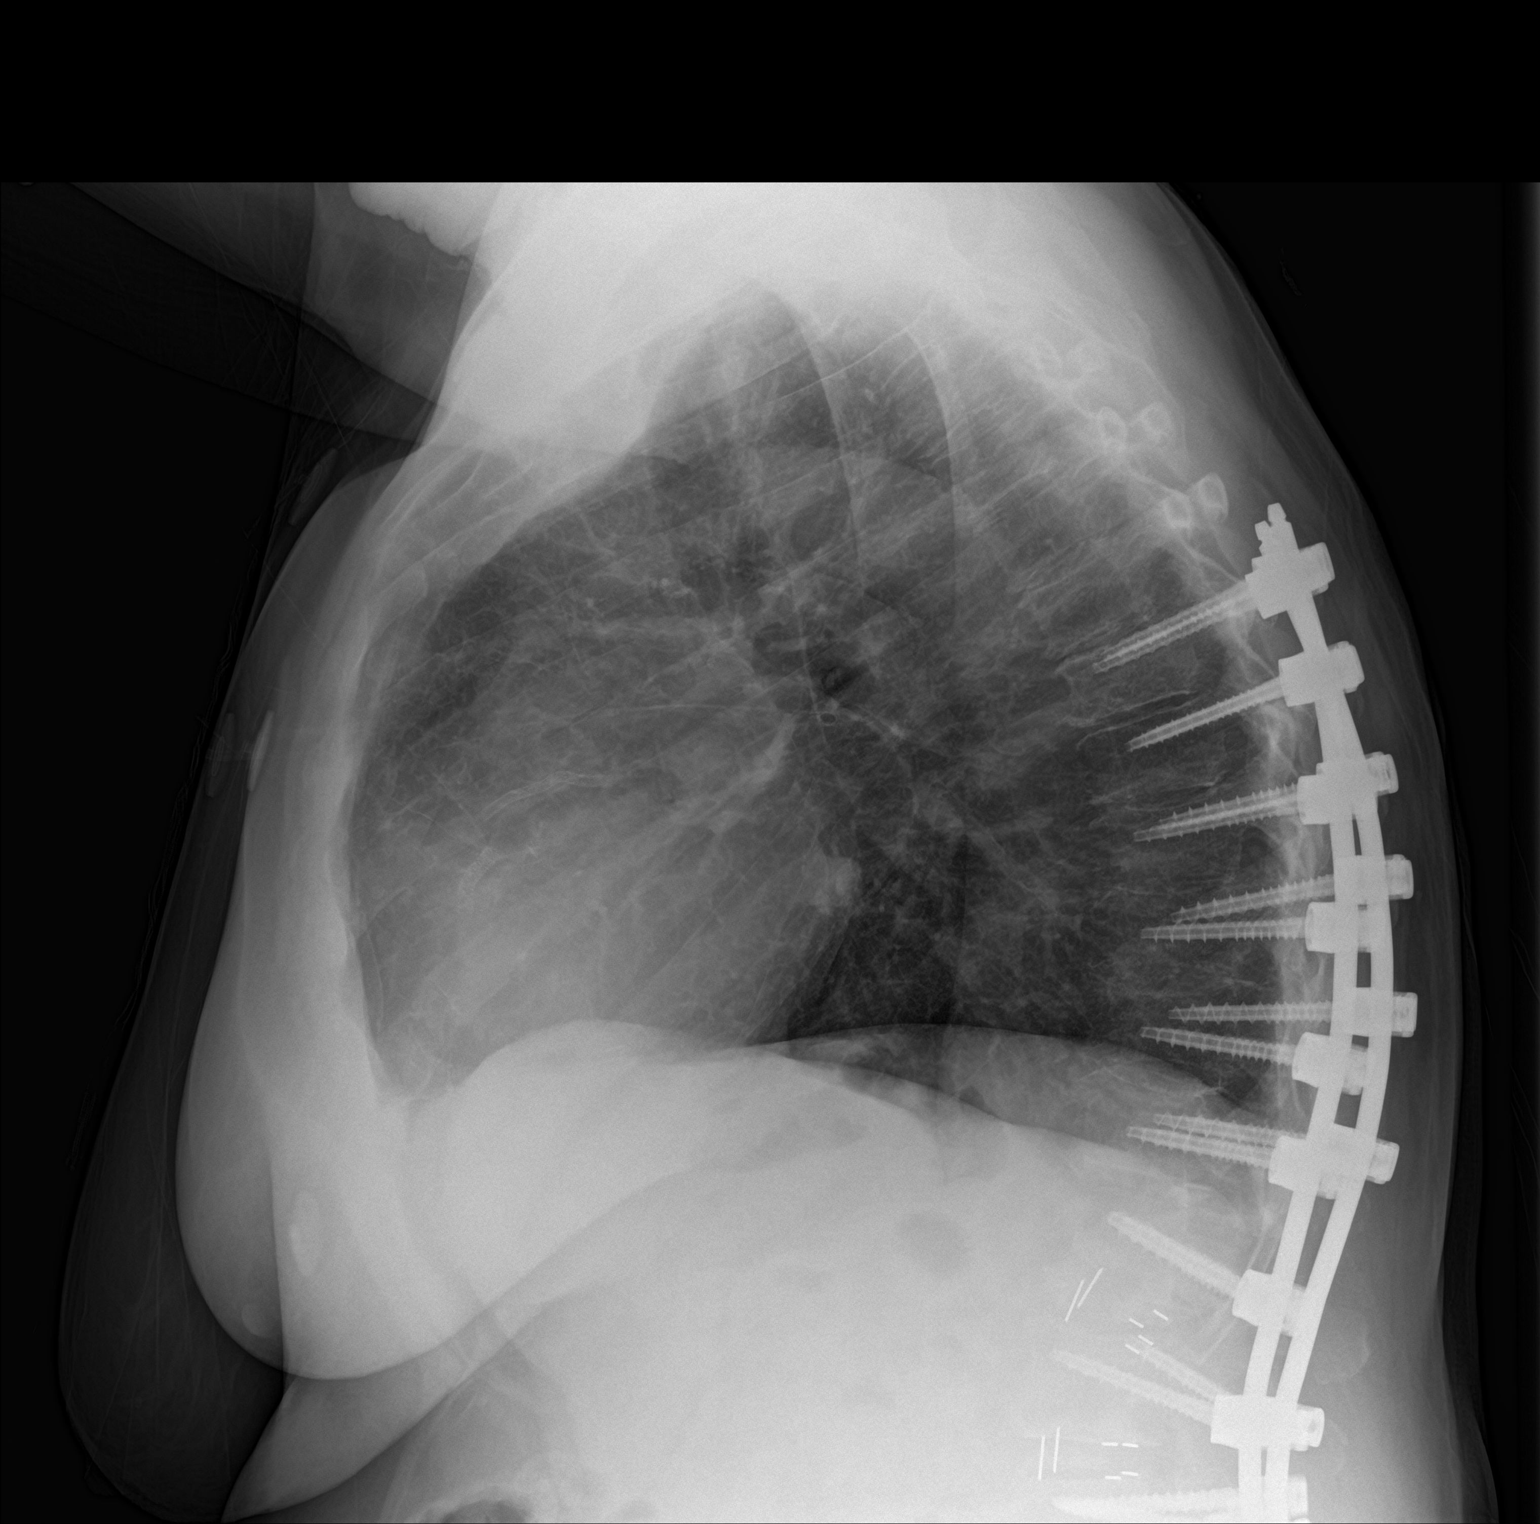

[2 of 2 positions shown; findings below may reference images not displayed]

FINDINGS: There is no edema or consolidation. The heart size and pulmonary
vascularity are normal. No adenopathy. There is a left anterior
descending coronary artery stent. There is postoperative change in
the mid and lower thoracic as well as visualized upper lumbar spine.
IMPRESSION: No edema or consolidation.

## 2016-06-28 ENCOUNTER — Other Ambulatory Visit: Payer: Self-pay | Admitting: Interventional Cardiology

## 2017-02-18 ENCOUNTER — Other Ambulatory Visit: Payer: Self-pay | Admitting: Interventional Cardiology

## 2017-03-20 ENCOUNTER — Other Ambulatory Visit: Payer: Self-pay | Admitting: Interventional Cardiology

## 2017-04-10 ENCOUNTER — Other Ambulatory Visit: Payer: Self-pay | Admitting: Interventional Cardiology

## 2018-03-24 ENCOUNTER — Other Ambulatory Visit: Payer: Self-pay | Admitting: Interventional Cardiology
